# Patient Record
Sex: Male | Born: 1945
Health system: Southern US, Community
[De-identification: ages and names within clinical notes are randomized; demographics above are authoritative.]

## PROBLEM LIST (undated history)

## (undated) DIAGNOSIS — I739 Peripheral vascular disease, unspecified: Secondary | ICD-10-CM

## (undated) DIAGNOSIS — F32A Depression, unspecified: Secondary | ICD-10-CM

## (undated) DIAGNOSIS — E7800 Pure hypercholesterolemia, unspecified: Secondary | ICD-10-CM

## (undated) DIAGNOSIS — B1920 Unspecified viral hepatitis C without hepatic coma: Secondary | ICD-10-CM

## (undated) DIAGNOSIS — F1910 Other psychoactive substance abuse, uncomplicated: Secondary | ICD-10-CM

## (undated) DIAGNOSIS — I10 Essential (primary) hypertension: Secondary | ICD-10-CM

## (undated) DIAGNOSIS — R350 Frequency of micturition: Secondary | ICD-10-CM

## (undated) DIAGNOSIS — F419 Anxiety disorder, unspecified: Secondary | ICD-10-CM

## (undated) DIAGNOSIS — J449 Chronic obstructive pulmonary disease, unspecified: Secondary | ICD-10-CM

## (undated) DIAGNOSIS — L989 Disorder of the skin and subcutaneous tissue, unspecified: Secondary | ICD-10-CM

## (undated) DIAGNOSIS — E119 Type 2 diabetes mellitus without complications: Secondary | ICD-10-CM

## (undated) DIAGNOSIS — K219 Gastro-esophageal reflux disease without esophagitis: Secondary | ICD-10-CM

## (undated) DIAGNOSIS — Z531 Procedure and treatment not carried out because of patient's decision for reasons of belief and group pressure: Secondary | ICD-10-CM

## (undated) DIAGNOSIS — F329 Major depressive disorder, single episode, unspecified: Secondary | ICD-10-CM

## (undated) DIAGNOSIS — F411 Generalized anxiety disorder: Secondary | ICD-10-CM

## (undated) HISTORY — DX: Anxiety disorder, unspecified: F41.9

## (undated) HISTORY — DX: Chronic obstructive pulmonary disease, unspecified: J44.9

## (undated) HISTORY — DX: Other psychoactive substance abuse, uncomplicated: F19.10

---
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
## 2013-11-01 ENCOUNTER — Encounter (HOSPITAL_COMMUNITY): Payer: Self-pay | Admitting: Emergency Medicine

## 2013-11-01 ENCOUNTER — Emergency Department (INDEPENDENT_AMBULATORY_CARE_PROVIDER_SITE_OTHER)
Admission: EM | Admit: 2013-11-01 | Discharge: 2013-11-01 | Disposition: A | Discharge status: Home / Self Care | Payer: Medicare HMO | Source: Home / Self Care | Attending: Emergency Medicine | Admitting: Emergency Medicine

## 2013-11-01 DIAGNOSIS — Z760 Encounter for issue of repeat prescription: Secondary | ICD-10-CM

## 2013-11-01 DIAGNOSIS — I10 Essential (primary) hypertension: Secondary | ICD-10-CM

## 2013-11-01 HISTORY — DX: Pure hypercholesterolemia, unspecified: E78.00

## 2013-11-01 HISTORY — DX: Essential (primary) hypertension: I10

## 2013-11-01 MED ORDER — ATORVASTATIN CALCIUM 10 MG PO TABS: 10.0000 mg | ORAL_TABLET | Freq: Every day | ORAL | Status: DC

## 2013-11-01 MED ORDER — VALSARTAN 160 MG PO TABS: 160.0000 mg | ORAL_TABLET | Freq: Every day | ORAL | Status: DC

## 2013-11-01 MED ORDER — TRAZODONE HCL 50 MG PO TABS: 50.0000 mg | ORAL_TABLET | Freq: Every day | ORAL | Status: DC

## 2013-11-01 NOTE — ED Notes (Signed)
Pt   New  To  Area      Has  No PCP   Needs    Some  Of  His   meds  refill

## 2013-11-01 NOTE — Discharge Instructions (Signed)
Medication Refill, Emergency Department  We have refilled your medication today as a courtesy to you. It is best for your medical care, however, to take care of getting refills done through your primary caregiver's office. They have your records and can do a better job of follow-up than we can in the emergency department.  On maintenance medications, we often only prescribe enough medications to get you by until you are able to see your regular caregiver. This is a more expensive way to refill medications.  In the future, please plan for refills so that you will not have to use the emergency department for this.  Thank you for your help. Your help allows us to better take care of the daily emergencies that enter our department.  Document Released: 10/04/2003 Document Revised: 09/09/2011 Document Reviewed: 06/17/2005  ExitCare® Patient Information ©2014 [employer].

## 2013-11-01 NOTE — ED Provider Notes (Signed)
Medical screening examination/treatment/procedure(s) were performed by non-physician practitioner and as supervising physician I was immediately available for consultation/collaboration.  Leslee Homeavid Isaih Bulger, M.D.  Reuben Likesavid C Lenville Hibberd, MD 11/01/13 [phone]

## 2013-11-01 NOTE — ED Provider Notes (Signed)
CSN: [id number]     Arrival date & time 11/01/13  [id number] History   None    Chief Complaint  Patient presents with  . Medication Refill   (Consider location/radiation/quality/duration/timing/severity/associated sxs/prior Treatment) HPI Comments: Pt reports he recently moved here.  He needs rx for medications.   Pt's Rn has scheduled apointment  With primary MD on Friday  No medical complaints  The history is provided by the patient. No language interpreter was used.    Past Medical History  Diagnosis Date  . Diabetes mellitus without complication   . Hypertension   . Hypercholesterolemia    History reviewed. No pertinent past surgical history. History reviewed. No pertinent family history. History  Substance Use Topics  . Smoking status: Current Every Day Smoker  . Smokeless tobacco: Not on file  . Alcohol Use: No     Comment: former  drinker    Review of Systems  All other systems reviewed and are negative.   Allergies  Review of patient's allergies indicates no known allergies.  Home Medications   Prior to Admission medications   Medication Sig Start Date End Date Taking? Authorizing Provider  amLODipine (NORVASC) 5 MG tablet Take 5 mg by mouth daily.   Yes Historical Provider, MD  atorvastatin (LIPITOR) 10 MG tablet Take 10 mg by mouth daily.   Yes Historical Provider, MD  Canagliflozin (INVOKANA) 100 MG TABS Take by mouth.   Yes Historical Provider, MD  metFORMIN (GLUCOPHAGE) 500 MG tablet Take by mouth 2 (two) times daily with a meal.   Yes Historical Provider, MD  traZODone (DESYREL) 50 MG tablet Take 50 mg by mouth at bedtime.   Yes Historical Provider, MD  valsartan (DIOVAN) 160 MG tablet Take 160 mg by mouth daily.   Yes Historical Provider, MD   BP 136/86  Pulse 59  Temp(Src) 98.6 F (37 C) (Oral)  Resp 18  SpO2 90% Physical Exam  Nursing note and vitals reviewed. Constitutional: He is oriented to person, place, and time. He appears well-developed and  well-nourished.  HENT:  Head: Normocephalic.  Eyes: EOM are normal.  Neck: Normal range of motion.  Pulmonary/Chest: Effort normal.  Abdominal: He exhibits no distension.  Musculoskeletal: Normal range of motion.  Neurological: He is alert and oriented to person, place, and time.  Psychiatric: He has a normal mood and affect.    ED Course  Procedures (including critical care time) Labs Review Labs Reviewed - No data to display  Imaging Review No results found.   MDM   1. Medication refill    Valsartan Trazodone atorvastatin    Elson AreasLeslie K Collette Pescador, PA-C 11/01/13 [address]19  Jamair Cato K [place] Hills[place]ofia, [place] 11/01/13 1128  Lonia SkinnerLeslie K [place], PA-C 11/01/13 1128  Lonia SkinnerLeslie K [place], [place] 11/01/13 1143

## 2014-09-05 DIAGNOSIS — F17210 Nicotine dependence, cigarettes, uncomplicated: Secondary | ICD-10-CM | POA: Diagnosis not present

## 2014-09-05 DIAGNOSIS — N529 Male erectile dysfunction, unspecified: Secondary | ICD-10-CM | POA: Diagnosis not present

## 2014-09-05 DIAGNOSIS — E669 Obesity, unspecified: Secondary | ICD-10-CM | POA: Diagnosis not present

## 2014-09-05 DIAGNOSIS — E780 Pure hypercholesterolemia: Secondary | ICD-10-CM | POA: Diagnosis not present

## 2014-09-05 DIAGNOSIS — I10 Essential (primary) hypertension: Secondary | ICD-10-CM | POA: Diagnosis not present

## 2014-09-05 DIAGNOSIS — E119 Type 2 diabetes mellitus without complications: Secondary | ICD-10-CM | POA: Diagnosis not present

## 2014-09-05 DIAGNOSIS — R6889 Other general symptoms and signs: Secondary | ICD-10-CM | POA: Diagnosis not present

## 2014-09-27 DIAGNOSIS — R6889 Other general symptoms and signs: Secondary | ICD-10-CM | POA: Diagnosis not present

## 2014-12-07 DIAGNOSIS — F1920 Other psychoactive substance dependence, uncomplicated: Secondary | ICD-10-CM | POA: Diagnosis not present

## 2014-12-07 DIAGNOSIS — I10 Essential (primary) hypertension: Secondary | ICD-10-CM | POA: Diagnosis not present

## 2014-12-07 DIAGNOSIS — E780 Pure hypercholesterolemia: Secondary | ICD-10-CM | POA: Diagnosis not present

## 2014-12-07 DIAGNOSIS — F1010 Alcohol abuse, uncomplicated: Secondary | ICD-10-CM | POA: Diagnosis not present

## 2014-12-07 DIAGNOSIS — E119 Type 2 diabetes mellitus without complications: Secondary | ICD-10-CM | POA: Diagnosis not present

## 2014-12-07 DIAGNOSIS — R6889 Other general symptoms and signs: Secondary | ICD-10-CM | POA: Diagnosis not present

## 2014-12-07 DIAGNOSIS — F17210 Nicotine dependence, cigarettes, uncomplicated: Secondary | ICD-10-CM | POA: Diagnosis not present

## 2015-06-22 DIAGNOSIS — Z6831 Body mass index (BMI) 31.0-31.9, adult: Secondary | ICD-10-CM | POA: Diagnosis not present

## 2015-06-22 DIAGNOSIS — Z0000 Encounter for general adult medical examination without abnormal findings: Secondary | ICD-10-CM | POA: Diagnosis not present

## 2015-06-22 DIAGNOSIS — E784 Other hyperlipidemia: Secondary | ICD-10-CM | POA: Diagnosis not present

## 2015-06-22 DIAGNOSIS — E669 Obesity, unspecified: Secondary | ICD-10-CM | POA: Diagnosis not present

## 2015-06-22 DIAGNOSIS — E1169 Type 2 diabetes mellitus with other specified complication: Secondary | ICD-10-CM | POA: Diagnosis not present

## 2015-06-22 DIAGNOSIS — I10 Essential (primary) hypertension: Secondary | ICD-10-CM | POA: Diagnosis not present

## 2015-08-22 DIAGNOSIS — E119 Type 2 diabetes mellitus without complications: Secondary | ICD-10-CM | POA: Diagnosis not present

## 2015-08-22 DIAGNOSIS — Z209 Contact with and (suspected) exposure to unspecified communicable disease: Secondary | ICD-10-CM | POA: Diagnosis not present

## 2015-08-22 DIAGNOSIS — F17210 Nicotine dependence, cigarettes, uncomplicated: Secondary | ICD-10-CM | POA: Diagnosis not present

## 2015-08-22 DIAGNOSIS — R6889 Other general symptoms and signs: Secondary | ICD-10-CM | POA: Diagnosis not present

## 2015-08-22 DIAGNOSIS — Z7984 Long term (current) use of oral hypoglycemic drugs: Secondary | ICD-10-CM | POA: Diagnosis not present

## 2015-08-22 DIAGNOSIS — Z87898 Personal history of other specified conditions: Secondary | ICD-10-CM | POA: Diagnosis not present

## 2015-08-22 DIAGNOSIS — Z23 Encounter for immunization: Secondary | ICD-10-CM | POA: Diagnosis not present

## 2015-08-22 DIAGNOSIS — E7800 Pure hypercholesterolemia, unspecified: Secondary | ICD-10-CM | POA: Diagnosis not present

## 2015-08-22 DIAGNOSIS — I10 Essential (primary) hypertension: Secondary | ICD-10-CM | POA: Diagnosis not present

## 2015-09-01 DIAGNOSIS — R894 Abnormal immunological findings in specimens from other organs, systems and tissues: Secondary | ICD-10-CM | POA: Diagnosis not present

## 2015-10-04 ENCOUNTER — Other Ambulatory Visit (HOSPITAL_COMMUNITY): Payer: Self-pay | Admitting: Gastroenterology

## 2015-10-04 ENCOUNTER — Other Ambulatory Visit: Payer: Self-pay | Admitting: Gastroenterology

## 2015-10-04 DIAGNOSIS — B1920 Unspecified viral hepatitis C without hepatic coma: Secondary | ICD-10-CM

## 2015-11-01 ENCOUNTER — Ambulatory Visit (HOSPITAL_COMMUNITY)
Admission: EM | Admit: 2015-11-01 | Discharge: 2015-11-01 | Disposition: A | Discharge status: Home / Self Care | Payer: 59 | Attending: Family Medicine | Admitting: Family Medicine

## 2015-11-01 ENCOUNTER — Ambulatory Visit (INDEPENDENT_AMBULATORY_CARE_PROVIDER_SITE_OTHER): Payer: 59

## 2015-11-01 ENCOUNTER — Encounter (HOSPITAL_COMMUNITY): Payer: Self-pay | Admitting: *Deleted

## 2015-11-01 DIAGNOSIS — J440 Chronic obstructive pulmonary disease with acute lower respiratory infection: Secondary | ICD-10-CM

## 2015-11-01 DIAGNOSIS — J209 Acute bronchitis, unspecified: Secondary | ICD-10-CM

## 2015-11-01 DIAGNOSIS — R05 Cough: Secondary | ICD-10-CM | POA: Diagnosis not present

## 2015-11-01 MED ORDER — GUAIFENESIN-CODEINE 100-10 MG/5ML PO SYRP: 10.0000 mL | ORAL_SOLUTION | Freq: Four times a day (QID) | ORAL | Status: DC | PRN

## 2015-11-01 MED ORDER — PREDNISONE 50 MG PO TABS: ORAL_TABLET | ORAL | Status: DC

## 2015-11-01 MED ORDER — LEVOFLOXACIN 500 MG PO TABS: 500.0000 mg | ORAL_TABLET | Freq: Every day | ORAL | Status: DC

## 2015-11-01 NOTE — ED Provider Notes (Signed)
CSN: [id number]     Arrival date & time 11/01/15  1257 History   First MD Initiated Contact with Patient 11/01/15 1308     Chief Complaint  Patient presents with  . Cough   (Consider location/radiation/quality/duration/timing/severity/associated sxs/prior Treatment) Patient is a 70 y.o. male presenting with cough. The history is provided by the patient.  Cough Cough characteristics:  Productive Sputum characteristics:  Yellow Severity:  Moderate Onset quality:  Gradual Duration:  5 days Progression:  Unchanged Chronicity:  New Smoker: yes   Context: weather changes   Relieved by:  None tried Worsened by:  Nothing tried Associated symptoms: no shortness of breath and no wheezing     Past Medical History  Diagnosis Date  . Diabetes mellitus without complication (HCC)   . Hypertension   . Hypercholesterolemia    History reviewed. No pertinent past surgical history. History reviewed. No pertinent family history. Social History  Substance Use Topics  . Smoking status: Current Every Day Smoker  . Smokeless tobacco: None  . Alcohol Use: No     Comment: former  drinker    Review of Systems  Constitutional: Negative.   HENT: Negative.   Respiratory: Positive for cough. Negative for shortness of breath, wheezing and stridor.   Cardiovascular: Negative.   All other systems reviewed and are negative.   Allergies  Review of patient's allergies indicates no known allergies.  Home Medications   Prior to Admission medications   Medication Sig Start Date End Date Taking? Authorizing Provider  amLODipine (NORVASC) 5 MG tablet Take 5 mg by mouth daily.    Historical Provider, MD  atorvastatin (LIPITOR) 10 MG tablet Take 1 tablet (10 mg total) by mouth daily. 11/01/13   Elson Areas, PA-C  Canagliflozin [facility]) 100 MG TABS Take by mouth.    Historical Provider, MD  metFORMIN (GLUCOPHAGE) 500 MG tablet Take by mouth 2 (two) times daily with a meal.    Historical Provider, MD   traZODone (DESYREL) 50 MG tablet Take 1 tablet (50 mg total) by mouth at bedtime. 11/01/13   Elson Areas, PA-C  valsartan (DIOVAN) 160 MG tablet Take 1 tablet (160 mg total) by mouth daily. 11/01/13   Elson Areas, PA-C   Meds Ordered and Administered this Visit  Medications - No data to display  BP 132/78 mmHg  Pulse 78  Temp(Src) 98.6 F (37 C) (Oral)  Resp 16  SpO2 98% No data found.   Physical Exam  Constitutional: He is oriented to person, place, and time. He appears well-developed and well-nourished.  HENT:  Right Ear: External ear normal.  Left Ear: External ear normal.  Mouth/Throat: Oropharynx is clear and moist.  Neck: Normal range of motion. Neck supple.  Cardiovascular: Normal rate, regular rhythm, normal heart sounds and intact distal pulses.   Pulmonary/Chest: Effort normal. He has decreased breath sounds. He has wheezes. He has rhonchi.  Lymphadenopathy:    He has no cervical adenopathy.  Neurological: He is alert and oriented to person, place, and time.  Skin: Skin is warm and dry.  Nursing note and vitals reviewed.   ED Course  Procedures (including critical care time)  Labs Review Labs Reviewed - No data to display  Imaging Review No results found.  X-rays reviewed and report per radiologist.  Visual Acuity Review  Right Eye Distance:   Left Eye Distance:   Bilateral Distance:    Right Eye Near:   Left Eye Near:    Bilateral Near:  MDM  No diagnosis found.  Meds ordered this encounter  Medications  . levofloxacin (LEVAQUIN) 500 MG tablet    Sig: Take 1 tablet (500 mg total) by mouth daily.    Dispense:  7 tablet    Refill:  0  . guaiFENesin-codeine (ROBITUSSIN AC) 100-10 MG/5ML syrup    Sig: Take 10 mLs by mouth 4 (four) times daily as needed for cough.    Dispense:  180 mL    Refill:  0  . predniSONE (DELTASONE) 50 MG tablet    Sig: 1 tab daily for 2 days then 1/2 tab daily for 2 days. Start on thurs5/09/2015     Dispense:  3 tablet    Refill:  0      Linna HoffJames D Apurva Reily, MD 11/01/15 1409

## 2015-11-01 NOTE — ED Notes (Signed)
Pt  Has   Symptoms       Of    Cough  /  Congested   With    Onset    X  5  Days     Pt   Is  A   Smoker       The  Pt  Reports   Symptoms  Are  Not   releived  By  [facility]  meds

## 2015-11-02 ENCOUNTER — Ambulatory Visit (HOSPITAL_COMMUNITY): Payer: 59

## 2015-11-03 DIAGNOSIS — B1920 Unspecified viral hepatitis C without hepatic coma: Secondary | ICD-10-CM | POA: Diagnosis not present

## 2015-12-07 ENCOUNTER — Ambulatory Visit (HOSPITAL_COMMUNITY)
Admission: RE | Admit: 2015-12-07 | Discharge: 2015-12-07 | Disposition: A | Discharge status: Home / Self Care | Payer: Commercial Managed Care - HMO | Source: Ambulatory Visit | Attending: Gastroenterology | Admitting: Gastroenterology

## 2015-12-07 DIAGNOSIS — B1920 Unspecified viral hepatitis C without hepatic coma: Secondary | ICD-10-CM | POA: Diagnosis not present

## 2015-12-07 DIAGNOSIS — N281 Cyst of kidney, acquired: Secondary | ICD-10-CM | POA: Insufficient documentation

## 2015-12-07 DIAGNOSIS — I77811 Abdominal aortic ectasia: Secondary | ICD-10-CM | POA: Diagnosis not present

## 2016-01-11 DIAGNOSIS — B1920 Unspecified viral hepatitis C without hepatic coma: Secondary | ICD-10-CM | POA: Diagnosis not present

## 2016-01-11 DIAGNOSIS — Z1211 Encounter for screening for malignant neoplasm of colon: Secondary | ICD-10-CM | POA: Diagnosis not present

## 2016-01-11 DIAGNOSIS — Z720 Tobacco use: Secondary | ICD-10-CM | POA: Diagnosis not present

## 2016-01-11 DIAGNOSIS — N281 Cyst of kidney, acquired: Secondary | ICD-10-CM | POA: Diagnosis not present

## 2016-02-21 DIAGNOSIS — F17210 Nicotine dependence, cigarettes, uncomplicated: Secondary | ICD-10-CM | POA: Diagnosis not present

## 2016-02-21 DIAGNOSIS — Z87898 Personal history of other specified conditions: Secondary | ICD-10-CM | POA: Diagnosis not present

## 2016-02-21 DIAGNOSIS — I10 Essential (primary) hypertension: Secondary | ICD-10-CM | POA: Diagnosis not present

## 2016-02-21 DIAGNOSIS — E119 Type 2 diabetes mellitus without complications: Secondary | ICD-10-CM | POA: Diagnosis not present

## 2016-02-21 DIAGNOSIS — R894 Abnormal immunological findings in specimens from other organs, systems and tissues: Secondary | ICD-10-CM | POA: Diagnosis not present

## 2016-02-21 DIAGNOSIS — R6889 Other general symptoms and signs: Secondary | ICD-10-CM | POA: Diagnosis not present

## 2016-02-21 DIAGNOSIS — F411 Generalized anxiety disorder: Secondary | ICD-10-CM | POA: Diagnosis not present

## 2016-02-21 DIAGNOSIS — Z23 Encounter for immunization: Secondary | ICD-10-CM | POA: Diagnosis not present

## 2016-02-21 DIAGNOSIS — Z1211 Encounter for screening for malignant neoplasm of colon: Secondary | ICD-10-CM | POA: Diagnosis not present

## 2016-02-21 DIAGNOSIS — E669 Obesity, unspecified: Secondary | ICD-10-CM | POA: Diagnosis not present

## 2016-02-21 DIAGNOSIS — B1920 Unspecified viral hepatitis C without hepatic coma: Secondary | ICD-10-CM | POA: Diagnosis not present

## 2016-02-21 DIAGNOSIS — E7800 Pure hypercholesterolemia, unspecified: Secondary | ICD-10-CM | POA: Diagnosis not present

## 2016-03-06 DIAGNOSIS — Z1211 Encounter for screening for malignant neoplasm of colon: Secondary | ICD-10-CM | POA: Diagnosis not present

## 2016-03-22 DIAGNOSIS — B1920 Unspecified viral hepatitis C without hepatic coma: Secondary | ICD-10-CM | POA: Diagnosis not present

## 2016-04-25 DIAGNOSIS — Z23 Encounter for immunization: Secondary | ICD-10-CM | POA: Diagnosis not present

## 2016-04-25 DIAGNOSIS — F419 Anxiety disorder, unspecified: Secondary | ICD-10-CM | POA: Diagnosis not present

## 2016-08-07 DIAGNOSIS — B1920 Unspecified viral hepatitis C without hepatic coma: Secondary | ICD-10-CM | POA: Diagnosis not present

## 2016-10-08 DIAGNOSIS — B1920 Unspecified viral hepatitis C without hepatic coma: Secondary | ICD-10-CM | POA: Insufficient documentation

## 2016-10-08 DIAGNOSIS — Z720 Tobacco use: Secondary | ICD-10-CM | POA: Insufficient documentation

## 2016-10-08 DIAGNOSIS — F1910 Other psychoactive substance abuse, uncomplicated: Secondary | ICD-10-CM | POA: Insufficient documentation

## 2016-10-08 DIAGNOSIS — F411 Generalized anxiety disorder: Secondary | ICD-10-CM | POA: Insufficient documentation

## 2016-10-08 DIAGNOSIS — E785 Hyperlipidemia, unspecified: Secondary | ICD-10-CM | POA: Insufficient documentation

## 2016-10-08 DIAGNOSIS — E119 Type 2 diabetes mellitus without complications: Secondary | ICD-10-CM | POA: Insufficient documentation

## 2016-10-08 DIAGNOSIS — F1010 Alcohol abuse, uncomplicated: Secondary | ICD-10-CM | POA: Insufficient documentation

## 2016-10-08 DIAGNOSIS — N529 Male erectile dysfunction, unspecified: Secondary | ICD-10-CM | POA: Insufficient documentation

## 2016-10-08 DIAGNOSIS — I10 Essential (primary) hypertension: Secondary | ICD-10-CM | POA: Insufficient documentation

## 2016-10-09 ENCOUNTER — Ambulatory Visit (INDEPENDENT_AMBULATORY_CARE_PROVIDER_SITE_OTHER): Payer: Medicare HMO | Admitting: Family Medicine

## 2016-10-09 ENCOUNTER — Encounter: Payer: Self-pay | Admitting: Family Medicine

## 2016-10-09 VITALS — BP 128/76 | HR 64 | Temp 98.5°F | Resp 20 | Ht 70.0 in | Wt 217.1 lb

## 2016-10-09 DIAGNOSIS — F411 Generalized anxiety disorder: Secondary | ICD-10-CM | POA: Diagnosis not present

## 2016-10-09 DIAGNOSIS — Z720 Tobacco use: Secondary | ICD-10-CM | POA: Diagnosis not present

## 2016-10-09 DIAGNOSIS — B182 Chronic viral hepatitis C: Secondary | ICD-10-CM

## 2016-10-09 DIAGNOSIS — I10 Essential (primary) hypertension: Secondary | ICD-10-CM | POA: Diagnosis not present

## 2016-10-09 DIAGNOSIS — F1910 Other psychoactive substance abuse, uncomplicated: Secondary | ICD-10-CM

## 2016-10-09 DIAGNOSIS — E119 Type 2 diabetes mellitus without complications: Secondary | ICD-10-CM | POA: Diagnosis not present

## 2016-10-09 DIAGNOSIS — E785 Hyperlipidemia, unspecified: Secondary | ICD-10-CM

## 2016-10-09 DIAGNOSIS — F1010 Alcohol abuse, uncomplicated: Secondary | ICD-10-CM

## 2016-10-09 DIAGNOSIS — H547 Unspecified visual loss: Secondary | ICD-10-CM | POA: Diagnosis not present

## 2016-10-09 DIAGNOSIS — N529 Male erectile dysfunction, unspecified: Secondary | ICD-10-CM

## 2016-10-09 LAB — CBC
HCT: 50 % (ref 38.5–50.0)
HEMOGLOBIN: 17.4 g/dL — AB (ref 13.2–17.1)
MCH: 30.5 pg (ref 27.0–33.0)
MCHC: 34.8 g/dL (ref 32.0–36.0)
MCV: 87.6 fL (ref 80.0–100.0)
MPV: 10.4 fL (ref 7.5–12.5)
PLATELETS: 219 10*3/uL (ref 140–400)
RBC: 5.71 MIL/uL (ref 4.20–5.80)
RDW: 14 % (ref 11.0–15.0)
WBC: 11.1 10*3/uL — ABNORMAL HIGH (ref 3.8–10.8)

## 2016-10-09 LAB — COMPREHENSIVE METABOLIC PANEL
ALBUMIN: 3.7 g/dL (ref 3.6–5.1)
ALT: 17 U/L (ref 9–46)
AST: 15 U/L (ref 10–35)
Alkaline Phosphatase: 125 U/L — ABNORMAL HIGH (ref 40–115)
BILIRUBIN TOTAL: 0.5 mg/dL (ref 0.2–1.2)
BUN: 9 mg/dL (ref 7–25)
CO2: 25 mmol/L (ref 20–31)
CREATININE: 0.85 mg/dL (ref 0.70–1.25)
Calcium: 9.1 mg/dL (ref 8.6–10.3)
Chloride: 106 mmol/L (ref 98–110)
GLUCOSE: 95 mg/dL (ref 65–99)
Potassium: 3.8 mmol/L (ref 3.5–5.3)
Sodium: 139 mmol/L (ref 135–146)
Total Protein: 7.2 g/dL (ref 6.1–8.1)

## 2016-10-09 LAB — LIPID PANEL
CHOL/HDL RATIO: 3.8 ratio (ref <5.0)
Cholesterol: 117 mg/dL (ref <200)
HDL: 31 mg/dL — ABNORMAL LOW (ref >40)
LDL Cholesterol: 61 mg/dL (ref <100)
Triglycerides: 126 mg/dL (ref <150)
VLDL: 25 mg/dL (ref <30)

## 2016-10-09 NOTE — Patient Instructions (Signed)
Drink plenty of water Stop drinking soda Walk every day that you are able Stop smoking No change in medicines Need updated labs today I will send you a letter with your test results.  If there is anything of concern, we will call right away.   We will talk about lung cancer screening and colon cancer screening next time Come back in a month

## 2016-10-09 NOTE — Progress Notes (Signed)
Chief Complaint  Patient presents with  . Establish Care   New to establish Old records DR Hyacinth Meeker at [facility] reviewed Well controlled DM with A1c under 7 Admits he follows NO diet and drinks coke every day BP well controlled  Last LDL 29 Is still abstaining from drugs and alcohol for 2 y I have discussed the multiple health risks associated with cigarette smoking including, but not limited to, cardiovascular disease, lung disease and cancer.  I have strongly recommended that smoking be stopped.  I have reviewed the various methods of quitting including cold [place], classes, nicotine replacements and prescription medications.  I have offered assistance in this difficult process.  He says he has patches from his insurance company and will"put one on today" He complains of anxiety.  Requests valium.  I note in the old records he used to ask Dr Hyacinth Meeker for this.  He was given lexapro.  Didn't like it.  Does not want anything else.   Never had colonoscopy Never had chest CT We will discuss this next visit His hepatitis C was successfully treated.  Patient Active Problem List   Diagnosis Date Noted  . Alcohol abuse 10/08/2016  . Substance abuse 10/08/2016  . Tobacco abuse 10/08/2016  . GAD (generalized anxiety disorder) 10/08/2016  . Controlled type 2 diabetes mellitus without complication, without long-term current use of insulin (HCC) 10/08/2016  . HLD (hyperlipidemia) 10/08/2016  . Hepatitis C 10/08/2016  . Essential hypertension 10/08/2016  . ED (erectile dysfunction) 10/08/2016    Outpatient Encounter Prescriptions as of 10/09/2016  Medication Sig  . amLODipine (NORVASC) 5 MG tablet Take 5 mg by mouth daily.  [name] aspirin 325 MG EC tablet Take 325 mg by mouth daily.  [name] atorvastatin (LIPITOR) 10 MG tablet Take 1 tablet (10 mg total) by mouth daily.  . Canagliflozin (INVOKANA) 100 MG TABS Take by mouth.  . valsartan (DIOVAN) 160 MG tablet Take 1 tablet (160 mg total) by  mouth daily.   No facility-administered encounter medications on file as of 10/09/2016.     Past Medical History:  Diagnosis Date  . COPD (chronic obstructive pulmonary disease) (HCC)   . Diabetes mellitus without complication (HCC)   . Hep C w/o coma, chronic (HCC)   . Hypercholesterolemia   . Hypertension   . Substance abuse    History reviewed. No pertinent surgical history.  Social History   Social History  . Marital status: Single    Spouse name: N/A  . Number of children: 0  . Years of education: 26   Occupational History  . retired     labor   Social History Main Topics  . Smoking status: Current Every Day Smoker    Packs/day: 1.00    Types: Cigarettes    Start date: 07/01/1961  . Smokeless tobacco: Never Used     Comment: using a patch  . Alcohol use No     Comment: former  drinker quit 2 years  . Drug use: No     Comment: recovered  . Sexual activity: Not Currently   Other Topics Concern  . Not on file   Social History Narrative   Live in boarding house   Renting a room   looking for apartment   Never had license   Likes to read    Family History  Problem Relation Age of Onset  . Alzheimer's disease Mother   . Mental illness Mother   . Cancer Father     bladder?  [name]  Hypertension Father   . Alcohol abuse Father   . Heart disease Sister   . Hearing loss Sister     valve  . Alcohol abuse Brother   . Diabetes Paternal Aunt   . Alcohol abuse Brother   . Alcohol abuse Brother   . Alcohol abuse Brother   . Early death Brother     MVA  . Cancer Maternal Grandmother     Review of Systems  Constitutional: Negative for chills, fever and weight loss.  HENT: Negative for congestion and hearing loss.   Eyes: Negative for blurred vision and pain.       Overdue eye exam  Respiratory: Positive for wheezing. Negative for cough and shortness of breath.   Cardiovascular: Negative for chest pain and leg swelling.  Gastrointestinal: Negative for  abdominal pain, constipation, diarrhea and heartburn.  Genitourinary: Negative for dysuria and frequency.  Musculoskeletal: Negative for falls, joint pain and myalgias.  Neurological: Negative for dizziness, seizures and headaches.  Psychiatric/Behavioral: Negative for depression and substance abuse. The patient is nervous/anxious and has insomnia.    BP 128/76 (BP Location: Right Arm, Patient Position: Sitting, Cuff Size: Normal)   Pulse 64   Temp 98.5 F (36.9 C) (Temporal)   Resp 20   Ht  (1.778 m)   Wt 217 lb 1.3 oz (98.5 kg)   SpO2 97%   BMI 31.15 kg/m   Physical Exam  Constitutional: He is oriented to person, place, and time. He appears well-developed and well-nourished.  Smells of tobacco  HENT:  Head: Normocephalic and atraumatic.  Mouth/Throat: Oropharynx is clear and moist.  Eyes: Conjunctivae are normal. Pupils are equal, round, and reactive to light.  Neck: Normal range of motion. Neck supple. No thyromegaly present.  Cardiovascular: Normal rate, regular rhythm and normal heart sounds.   Pulmonary/Chest: Effort normal. No respiratory distress. He has wheezes.  Scattered end insp wheeze  Abdominal: Soft. Bowel sounds are normal.  Musculoskeletal: Normal range of motion. He exhibits no edema.  Lymphadenopathy:    He has no cervical adenopathy.  Neurological: He is alert and oriented to person, place, and time.  Gait normal  Skin: Skin is warm and dry.  Psychiatric: He has a normal mood and affect. His behavior is normal. Thought content normal.  Nursing note and vitals reviewed.  ASSESSMENT/PLAN:  1. Alcohol abuse In remission  2. Substance abuse In remission  3. Tobacco abuse Trying to quit  4. GAD (generalized anxiety disorder) Refuses med other than valium  5. Controlled type 2 diabetes mellitus without complication, without long-term current use of insulin (HCC)  - CBC - Comprehensive metabolic panel - Hemoglobin A1c - Lipid panel -  Urinalysis, Routine w reflex microscopic - Ambulatory referral to Ophthalmology  6. Hyperlipidemia, unspecified hyperlipidemia type  7. Chronic hepatitis C without hepatic coma (HCC) treated  8. Essential hypertension  9. Erectile dysfunction, unspecified erectile dysfunction type By history 10. Vision impairment - Ambulatory referral to Ophthalmology   Patient Instructions  Drink plenty of water Stop drinking soda Walk every day that you are able Stop smoking No change in medicines Need updated labs today I will send you a letter with your test results.  If there is anything of concern, we will call right away.   We will talk about lung cancer screening and colon cancer screening next time Come back in a month   Eustace Moore, MD

## 2016-10-10 ENCOUNTER — Encounter: Payer: Self-pay | Admitting: Family Medicine

## 2016-10-10 LAB — URINALYSIS, MICROSCOPIC ONLY
Bacteria, UA: NONE SEEN [HPF]
Casts: NONE SEEN [LPF]
Crystals: NONE SEEN [HPF]
RBC / HPF: NONE SEEN RBC/HPF (ref <=2)
Squamous Epithelial / LPF: NONE SEEN [HPF] (ref <=5)
WBC UA: NONE SEEN WBC/HPF (ref <=5)
Yeast: NONE SEEN [HPF]

## 2016-10-10 LAB — URINALYSIS, ROUTINE W REFLEX MICROSCOPIC
BILIRUBIN URINE: NEGATIVE
Hgb urine dipstick: NEGATIVE
KETONES UR: NEGATIVE
Leukocytes, UA: NEGATIVE
Nitrite: NEGATIVE
Specific Gravity, Urine: 1.01 (ref 1.001–1.035)
pH: 6 (ref 5.0–8.0)

## 2016-10-10 LAB — HEMOGLOBIN A1C
Hgb A1c MFr Bld: 6.2 % — ABNORMAL HIGH (ref <5.7)
MEAN PLASMA GLUCOSE: 131 mg/dL

## 2016-11-08 ENCOUNTER — Ambulatory Visit: Payer: Medicare HMO | Admitting: Family Medicine

## 2016-11-11 ENCOUNTER — Encounter: Payer: Self-pay | Admitting: Family Medicine

## 2016-11-11 ENCOUNTER — Ambulatory Visit (INDEPENDENT_AMBULATORY_CARE_PROVIDER_SITE_OTHER): Payer: Medicare HMO | Admitting: Family Medicine

## 2016-11-11 ENCOUNTER — Telehealth: Payer: Self-pay

## 2016-11-11 VITALS — BP 110/72 | HR 72 | Temp 97.7°F | Resp 18 | Ht 70.0 in | Wt 213.0 lb

## 2016-11-11 DIAGNOSIS — E119 Type 2 diabetes mellitus without complications: Secondary | ICD-10-CM

## 2016-11-11 DIAGNOSIS — I10 Essential (primary) hypertension: Secondary | ICD-10-CM | POA: Diagnosis not present

## 2016-11-11 DIAGNOSIS — E785 Hyperlipidemia, unspecified: Secondary | ICD-10-CM | POA: Diagnosis not present

## 2016-11-11 DIAGNOSIS — Z87891 Personal history of nicotine dependence: Secondary | ICD-10-CM | POA: Diagnosis not present

## 2016-11-11 DIAGNOSIS — Z8619 Personal history of other infectious and parasitic diseases: Secondary | ICD-10-CM

## 2016-11-11 MED ORDER — METFORMIN HCL 500 MG PO TABS: 250.0000 mg | ORAL_TABLET | Freq: Every day | ORAL | 1 refills | Status: DC

## 2016-11-11 NOTE — Patient Instructions (Addendum)
Eye exam next month / make sure you get me a copy of the report  [facility]  Your blood tests were all good  You still need colonoscopy  Stay on the same medicines  See me in 3 months

## 2016-11-11 NOTE — Progress Notes (Signed)
Chief Complaint  Patient presents with  . Follow-up    1 month   Routine follow up DM well controlled BP is good LDL is 61 Compliant with medicines Again brings up that Valium is the "only thing that helps me", and that his stress is making him sick. He agrees to chest CT for lung cancer screening Refuses colon cancer screening - he did a 3 card hemoccult this year and thinks it good. Is scheduled for an eye exam this month  Patient Active Problem List   Diagnosis Date Noted  . History of hepatitis C 11/11/2016  . History of smoking 30 or more pack years 11/11/2016  . Alcohol abuse 10/08/2016  . Substance abuse 10/08/2016  . Tobacco abuse 10/08/2016  . GAD (generalized anxiety disorder) 10/08/2016  . Controlled type 2 diabetes mellitus without complication, without long-term current use of insulin (HCC) 10/08/2016  . HLD (hyperlipidemia) 10/08/2016  . Essential hypertension 10/08/2016  . ED (erectile dysfunction) 10/08/2016    Outpatient Encounter Prescriptions as of 11/11/2016  Medication Sig  . aspirin 325 MG EC tablet Take 325 mg by mouth daily.  [name]. atorvastatin (LIPITOR) 10 MG tablet Take 1 tablet (10 mg total) by mouth daily.  . Canagliflozin (INVOKANA) 100 MG TABS Take by mouth.  . metFORMIN (GLUCOPHAGE) 500 MG tablet Take 0.5 tablets (250 mg total) by mouth daily with breakfast.  . valsartan (DIOVAN) 160 MG tablet Take 1 tablet (160 mg total) by mouth daily.  [name]. amLODipine (NORVASC) 10 MG tablet TK 1 T PO QD   No facility-administered encounter medications on file as of 11/11/2016.     No Known Allergies  Review of Systems  Constitutional: Negative for activity change and appetite change.  HENT: Negative for congestion and dental problem.   Eyes: Negative for photophobia and visual disturbance.  Respiratory: Negative for cough and shortness of breath.   Cardiovascular: Negative for chest pain, palpitations and leg swelling.  Gastrointestinal: Negative for  blood in stool, constipation and diarrhea.  Genitourinary: Negative for dysuria and frequency.  Musculoskeletal: Negative for arthralgias and gait problem.  Neurological: Negative for dizziness and headaches.  Psychiatric/Behavioral: Positive for dysphoric mood. Negative for sleep disturbance.   BP 110/72 (BP Location: Right Arm, Patient Position: Sitting, Cuff Size: Normal)   Pulse 72   Temp 97.7 F (36.5 C) (Temporal)   Resp 18   Ht 5\' 10"  (1.778 m)   Wt 213 lb 0.6 oz (96.6 kg)   SpO2 97%   BMI 30.57 kg/m   Physical Exam  Constitutional: He is oriented to person, place, and time. He appears well-developed and well-nourished. No distress.  HENT:  Head: Normocephalic and atraumatic.  Mouth/Throat: Oropharynx is clear and moist.  Eyes: Conjunctivae are normal. Pupils are equal, round, and reactive to light.  Neck: Normal range of motion.  Cardiovascular: Normal rate, regular rhythm and normal heart sounds.   Pulmonary/Chest: Effort normal and breath sounds normal.  Lymphadenopathy:    He has no cervical adenopathy.  Neurological: He is alert and oriented to person, place, and time.  Psychiatric: He has a normal mood and affect. His behavior is normal.    ASSESSMENT/PLAN:  1. Essential hypertension controlled  2. Controlled type 2 diabetes mellitus without complication, without long-term current use of insulin (HCC) A1c is 6.2  3. Hyperlipidemia, unspecified hyperlipidemia type LDL is 61  4. History of smoking 30 or more pack years  - CT CHEST LUNG CA SCREEN LOW DOSE W/O CM; Future  5. History of hepatitis C treated   Patient Instructions  Eye exam next month / make sure you get me a copy of the report  I am scheduling the lung cancer test at [facility]  Your blood tests were all good  You still need colonoscopy  Stay on the same medicines  See me in 3 months   Eustace Moore, MD

## 2016-11-11 NOTE — Telephone Encounter (Signed)
Please change pcp to dr nelson 

## 2016-11-20 ENCOUNTER — Ambulatory Visit (HOSPITAL_COMMUNITY)
Admission: RE | Admit: 2016-11-20 | Discharge: 2016-11-20 | Disposition: A | Discharge status: Home / Self Care | Payer: Medicare HMO | Source: Ambulatory Visit | Attending: Family Medicine | Admitting: Family Medicine

## 2016-11-20 DIAGNOSIS — J439 Emphysema, unspecified: Secondary | ICD-10-CM | POA: Diagnosis not present

## 2016-11-20 DIAGNOSIS — Z87891 Personal history of nicotine dependence: Secondary | ICD-10-CM | POA: Insufficient documentation

## 2016-11-20 DIAGNOSIS — I700 Atherosclerosis of aorta: Secondary | ICD-10-CM | POA: Diagnosis not present

## 2016-11-20 DIAGNOSIS — I2510 Atherosclerotic heart disease of native coronary artery without angina pectoris: Secondary | ICD-10-CM | POA: Diagnosis not present

## 2016-11-20 DIAGNOSIS — F17210 Nicotine dependence, cigarettes, uncomplicated: Secondary | ICD-10-CM | POA: Diagnosis not present

## 2016-11-22 ENCOUNTER — Telehealth: Payer: Self-pay | Admitting: Family Medicine

## 2016-11-22 ENCOUNTER — Encounter: Payer: Self-pay | Admitting: Family Medicine

## 2016-11-22 ENCOUNTER — Other Ambulatory Visit: Payer: Self-pay

## 2016-11-22 MED ORDER — VALSARTAN 160 MG PO TABS: 160.0000 mg | ORAL_TABLET | Freq: Every day | ORAL | 1 refills | Status: DC

## 2016-11-22 MED ORDER — CANAGLIFLOZIN 100 MG PO TABS: 100.0000 mg | ORAL_TABLET | Freq: Every day | ORAL | 1 refills | Status: DC

## 2016-11-22 NOTE — Telephone Encounter (Signed)
Patient calling re: Rx Valsartan.  He states that the Valsartan should be 320mg  instead 160mg .  [facility] [place]  Patient has enough to last for week.

## 2016-11-27 MED ORDER — VALSARTAN 320 MG PO TABS: 320.0000 mg | ORAL_TABLET | Freq: Every day | ORAL | 3 refills | Status: DC

## 2016-11-28 ENCOUNTER — Other Ambulatory Visit: Payer: Self-pay

## 2016-12-03 DIAGNOSIS — B1920 Unspecified viral hepatitis C without hepatic coma: Secondary | ICD-10-CM | POA: Diagnosis not present

## 2016-12-24 DIAGNOSIS — H5200 Hypermetropia, unspecified eye: Secondary | ICD-10-CM | POA: Diagnosis not present

## 2016-12-24 DIAGNOSIS — Z0100 Encounter for examination of eyes and vision without abnormal findings: Secondary | ICD-10-CM | POA: Diagnosis not present

## 2017-01-27 ENCOUNTER — Other Ambulatory Visit: Payer: Self-pay | Admitting: Family Medicine

## 2017-01-27 NOTE — Telephone Encounter (Signed)
Patient would like to speak with Dr. Delton SeeNelson or the nurse about a recall on his medication (Valsartan).  Patient is aware that Dr. Delton SeeNelson with be back in the office tomorrow.

## 2017-01-28 ENCOUNTER — Telehealth: Payer: Self-pay | Admitting: *Deleted

## 2017-01-28 MED ORDER — LOSARTAN POTASSIUM 100 MG PO TABS: 100.0000 mg | ORAL_TABLET | Freq: Every day | ORAL | 3 refills | Status: DC

## 2017-01-28 MED ORDER — AMLODIPINE BESYLATE 10 MG PO TABS: ORAL_TABLET | ORAL | 0 refills | Status: DC

## 2017-01-28 NOTE — Telephone Encounter (Signed)
Patient called stating he needs amlodipine to be refilled.

## 2017-01-28 NOTE — Telephone Encounter (Signed)
Patient informed of message below, verbalized understanding.  

## 2017-01-28 NOTE — Telephone Encounter (Signed)
Order pended. Attempted to call patient, no answer, unable to leave message. Patient will need to return in 1 week after starting new med to get bp check to ensure dose is correct.

## 2017-01-28 NOTE — Telephone Encounter (Signed)
Pend losartan 100 mg daily #90 with 3 refill.  All valsartan patients can be todl we will switch them to a similar medicine with no recalls.

## 2017-01-29 ENCOUNTER — Telehealth: Payer: Self-pay | Admitting: *Deleted

## 2017-01-29 NOTE — Telephone Encounter (Signed)
Patient called to talk with the nurse about a medication that was recalled, patient stated it was a 325 mg I see asprin in the patient's chart for that mg patient stated she was not sure of the name, Please call patient

## 2017-01-29 NOTE — Telephone Encounter (Signed)
Spoke with patient about change in medication- he was concerned about dosage. I informed him that while losartan was similar to valsartan, that they were different medications and that the dosage was not going to be the same. He verbalized understanding and he was also informed to return to clinic in one week for BP check to ensure dosage is correct dosage.

## 2017-02-11 ENCOUNTER — Ambulatory Visit (INDEPENDENT_AMBULATORY_CARE_PROVIDER_SITE_OTHER): Payer: Medicare HMO | Admitting: Family Medicine

## 2017-02-11 ENCOUNTER — Encounter: Payer: Self-pay | Admitting: Family Medicine

## 2017-02-11 VITALS — BP 118/76 | HR 56 | Temp 98.6°F | Resp 18 | Ht 70.0 in | Wt 213.1 lb

## 2017-02-11 DIAGNOSIS — E119 Type 2 diabetes mellitus without complications: Secondary | ICD-10-CM

## 2017-02-11 DIAGNOSIS — I10 Essential (primary) hypertension: Secondary | ICD-10-CM

## 2017-02-11 DIAGNOSIS — E785 Hyperlipidemia, unspecified: Secondary | ICD-10-CM

## 2017-02-11 DIAGNOSIS — Z720 Tobacco use: Secondary | ICD-10-CM | POA: Diagnosis not present

## 2017-02-11 MED ORDER — CANAGLIFLOZIN 100 MG PO TABS: 100.0000 mg | ORAL_TABLET | Freq: Every day | ORAL | 3 refills | Status: DC

## 2017-02-11 NOTE — Patient Instructions (Addendum)
Need labs next time See me in 2- 3 months No change in medicines Come back for a PE next time Blood pressure is good Diabetes well controlled  Congratulations on your reducing the smoking - I hope you can quit

## 2017-02-11 NOTE — Progress Notes (Signed)
Chief Complaint  Patient presents with  . Follow-up    3 month   Patient is here for follow-up He states that he is feeling well. He is trying to quit smoking. He is down to 3 cigarettes a day. He is trying to stay active. He is walking daily. No chest pain or shortness of breath. No cough or wheeze. He is taking his blood pressure medicine. His blood pressure is well controlled. He has well-controlled diabetes with an A1c under 7.  Patient Active Problem List   Diagnosis Date Noted  . History of hepatitis C 11/11/2016  . History of smoking 30 or more pack years 11/11/2016  . Alcohol abuse 10/08/2016  . Substance abuse 10/08/2016  . Tobacco abuse 10/08/2016  . GAD (generalized anxiety disorder) 10/08/2016  . Controlled type 2 diabetes mellitus without complication, without long-term current use of insulin (HCC) 10/08/2016  . HLD (hyperlipidemia) 10/08/2016  . Essential hypertension 10/08/2016  . ED (erectile dysfunction) 10/08/2016    Outpatient Encounter Prescriptions as of 02/11/2017  Medication Sig  . amLODipine (NORVASC) 10 MG tablet TK 1 T PO QD  . aspirin 325 MG EC tablet Take 325 mg by mouth daily.  [name] atorvastatin (LIPITOR) 10 MG tablet Take 1 tablet (10 mg total) by mouth daily.  . canagliflozin (INVOKANA) 100 MG TABS tablet Take 1 tablet (100 mg total) by mouth daily before breakfast.  . losartan (COZAAR) 100 MG tablet Take 1 tablet (100 mg total) by mouth daily.  . metFORMIN (GLUCOPHAGE) 500 MG tablet Take 0.5 tablets (250 mg total) by mouth daily with breakfast.   No facility-administered encounter medications on file as of 02/11/2017.     No Known Allergies  Review of Systems  Constitutional: Negative for activity change and appetite change.  HENT: Negative for congestion and dental problem.   Eyes: Negative for photophobia and visual disturbance.  Respiratory: Negative for cough and shortness of breath.   Cardiovascular: Negative for chest pain,  palpitations and leg swelling.  Gastrointestinal: Negative for blood in stool, constipation and diarrhea.  Genitourinary: Negative for dysuria and frequency.  Musculoskeletal: Negative for arthralgias and gait problem.  Neurological: Negative for dizziness and headaches.  Psychiatric/Behavioral: Negative for sleep disturbance.    BP 118/76 (BP Location: Right Arm, Patient Position: Sitting, Cuff Size: Small)   Pulse (!) 56   Temp 98.6 F (37 C) (Temporal)   Resp 18   Ht 5\' 10"  (1.778 m)   Wt 213 lb 1.9 oz (96.7 kg)   SpO2 97%   BMI 30.58 kg/m   Physical Exam  Constitutional: He is oriented to person, place, and time. He appears well-developed and well-nourished. No distress.  Smells of tobacco  HENT:  Head: Normocephalic and atraumatic.  Mouth/Throat: Oropharynx is clear and moist.  Eyes: Pupils are equal, round, and reactive to light. Conjunctivae are normal.  Neck: Normal range of motion.  Cardiovascular: Normal rate, regular rhythm and normal heart sounds.   Pulmonary/Chest: Effort normal and breath sounds normal.  Musculoskeletal: He exhibits no edema.  Lymphadenopathy:    He has no cervical adenopathy.  Neurological: He is alert and oriented to person, place, and time. He displays normal reflexes. Coordination normal.  Psychiatric: He has a normal mood and affect. His behavior is normal.    ASSESSMENT/PLAN:  1. Essential hypertension   2. Controlled type 2 diabetes mellitus without complication, without long-term current use of insulin (HCC)  - canagliflozin (INVOKANA) 100 MG TABS tablet; Take 1 tablet (  100 mg total) by mouth daily before breakfast.  Dispense: 90 tablet; Refill: 3 - CBC - COMPLETE METABOLIC PANEL WITH GFR - Hemoglobin A1c - Lipid panel - Microalbumin / creatinine urine ratio - Urinalysis, Routine w reflex microscopic  3. Tobacco abuse Trying to quit  4. Hyperlipidemia, unspecified hyperlipidemia type Compliant with statin   Patient  Instructions  Need labs next time See me in 2- 3 months No change in medicines Come back for a PE next time Blood pressure is good Diabetes well controlled  Congratulations on your reducing the smoking - I hope you can quit   Eustace MooreYvonne Sue Melainie Krinsky, MD

## 2017-04-04 ENCOUNTER — Other Ambulatory Visit: Payer: Self-pay

## 2017-04-04 ENCOUNTER — Telehealth: Payer: Self-pay

## 2017-04-04 MED ORDER — ATORVASTATIN CALCIUM 10 MG PO TABS: 10.0000 mg | ORAL_TABLET | Freq: Every day | ORAL | 3 refills | Status: DC

## 2017-04-04 MED ORDER — ATORVASTATIN CALCIUM 10 MG PO TABS
10.0000 mg | ORAL_TABLET | Freq: Every day | ORAL | 3 refills | Status: DC
Start: 2017-04-04 — End: 2017-04-04

## 2017-04-04 NOTE — Telephone Encounter (Signed)
lipitor filled.

## 2017-04-12 ENCOUNTER — Emergency Department (HOSPITAL_COMMUNITY)
Admission: EM | Admit: 2017-04-12 | Discharge: 2017-04-12 | Disposition: A | Discharge status: Home / Self Care | Payer: Medicare HMO | Attending: Emergency Medicine | Admitting: Emergency Medicine

## 2017-04-12 ENCOUNTER — Encounter (HOSPITAL_COMMUNITY): Payer: Self-pay | Admitting: Emergency Medicine

## 2017-04-12 DIAGNOSIS — I10 Essential (primary) hypertension: Secondary | ICD-10-CM | POA: Insufficient documentation

## 2017-04-12 DIAGNOSIS — Z79899 Other long term (current) drug therapy: Secondary | ICD-10-CM | POA: Insufficient documentation

## 2017-04-12 DIAGNOSIS — Z720 Tobacco use: Secondary | ICD-10-CM | POA: Diagnosis not present

## 2017-04-12 DIAGNOSIS — J449 Chronic obstructive pulmonary disease, unspecified: Secondary | ICD-10-CM | POA: Insufficient documentation

## 2017-04-12 DIAGNOSIS — E119 Type 2 diabetes mellitus without complications: Secondary | ICD-10-CM | POA: Insufficient documentation

## 2017-04-12 DIAGNOSIS — Z7984 Long term (current) use of oral hypoglycemic drugs: Secondary | ICD-10-CM | POA: Diagnosis not present

## 2017-04-12 DIAGNOSIS — Z7982 Long term (current) use of aspirin: Secondary | ICD-10-CM | POA: Insufficient documentation

## 2017-04-12 DIAGNOSIS — M79661 Pain in right lower leg: Secondary | ICD-10-CM | POA: Diagnosis present

## 2017-04-12 DIAGNOSIS — I739 Peripheral vascular disease, unspecified: Secondary | ICD-10-CM | POA: Diagnosis not present

## 2017-04-12 DIAGNOSIS — F17210 Nicotine dependence, cigarettes, uncomplicated: Secondary | ICD-10-CM | POA: Diagnosis not present

## 2017-04-12 HISTORY — DX: Generalized anxiety disorder: F41.1

## 2017-04-12 NOTE — ED Triage Notes (Signed)
Patient c/o pain to back of right calf x2-3 months. Denies any swelling. Patient states that pain increases with walking. Per patient feels "like a stiff muscle." Patient reports taking a nap today and waking with lower part of leg feeling numb.

## 2017-04-12 NOTE — ED Provider Notes (Signed)
[facility]-EMERGENCY DEPT Provider Note   CSN: [id number] Arrival date & time: 04/12/17  1550     History   Chief Complaint Chief Complaint  Patient presents with  . Claudication    HPI Alex Marks is a 71 y.o. male.  HPI Patient presents with one-month history of pain in his right calf. States she's been having pain with walking particularly when he walks up hills. Goes away after some rest. No trauma. Swelling. He is diabetic. No injury to it. Feels a little stiff muscle. No fevers. No chest pain or trouble breathing.no numbness or weakness in the foot.he is a smoker but is working on quitting. States he has the patch. Past Medical History:  Diagnosis Date  . COPD (chronic obstructive pulmonary disease) (HCC)   . Diabetes mellitus without complication (HCC)   . GAD (generalized anxiety disorder)   . Hep C w/o coma, chronic (HCC)   . Hypercholesterolemia   . Hypertension   . Substance abuse [facility])     Patient Active Problem List   Diagnosis Date Noted  . History of hepatitis C 11/11/2016  . History of smoking 30 or more pack years 11/11/2016  . Alcohol abuse 10/08/2016  . Substance abuse (HCC) 10/08/2016  . Tobacco abuse 10/08/2016  . GAD (generalized anxiety disorder) 10/08/2016  . Controlled type 2 diabetes mellitus without complication, without long-term current use of insulin (HCC) 10/08/2016  . HLD (hyperlipidemia) 10/08/2016  . Essential hypertension 10/08/2016  . ED (erectile dysfunction) 10/08/2016    No past surgical history on file.     Home Medications    Prior to Admission medications   Medication Sig Start Date End Date Taking? Authorizing Provider  amLODipine (NORVASC) 10 MG tablet TK 1 T PO QD 01/28/17   Eustace Moore, MD  aspirin 325 MG EC tablet Take 325 mg by mouth daily.    [provider]  atorvastatin (LIPITOR) 10 MG tablet Take 1 tablet (10 mg total) by mouth daily. 04/04/17   Eustace Moore, MD  canagliflozin [facility]) 100  MG TABS tablet Take 1 tablet (100 mg total) by mouth daily before breakfast. 02/11/17   Eustace Moore, MD  losartan (COZAAR) 100 MG tablet Take 1 tablet (100 mg total) by mouth daily. 01/28/17   Eustace Moore, MD  metFORMIN (GLUCOPHAGE) 500 MG tablet Take 0.5 tablets (250 mg total) by mouth daily with breakfast. 11/11/16   Eustace Moore, MD    Family History Family History  Problem Relation Age of Onset  . Alzheimer's disease Mother   . Mental illness Mother   . Cancer Father        bladder?  [name] Hypertension Father   . Alcohol abuse Father   . Heart disease Sister   . Hearing loss Sister        valve  . Alcohol abuse Brother   . Diabetes Paternal Aunt   . Alcohol abuse Brother   . Alcohol abuse Brother   . Alcohol abuse Brother   . Early death Brother        MVA  . Cancer Maternal Grandmother     Social History Social History  Substance Use Topics  . Smoking status: Current Every Day Smoker    Packs/day: 1.00    Types: Cigarettes    Start date: 07/01/1961  . Smokeless tobacco: Never Used     Comment: using a patch  . Alcohol use No     Allergies   Patient has no  known allergies.   Review of Systems Review of Systems  Constitutional: Negative for appetite change.  Respiratory: Negative for shortness of breath.   Cardiovascular: Negative for chest pain.  Genitourinary: Negative for flank pain.  Musculoskeletal: Negative for back pain.  Neurological: Negative for weakness and numbness.  Hematological: Negative for adenopathy.  Psychiatric/Behavioral: Negative for confusion.     Physical Exam Updated Vital Signs BP 94/62 (BP Location: Right Leg)   Pulse 70   Temp 98.8 F (37.1 C) (Oral)   Resp 18   Ht  (1.778 m)   Wt 96.2 kg (212 lb)   SpO2 97%   BMI 30.42 kg/m   Physical Exam  Constitutional: He appears well-developed and well-nourished.  Patient smells of smoke  HENT:  Head: Normocephalic.  Cardiovascular: Normal rate.     Pulmonary/Chest: Effort normal. He has no wheezes.  Abdominal: There is no tenderness.  Musculoskeletal:  No edema of lower extremities. Left foot and lower leg is warmer than the contralateral. Somewhat pale on the right side. Has a dopplerable dorsalis pedis pulse however on the right side. Also has pulse on the left side with a Doppler. Sensation grossly intact. No tenderness over. No peripheral edema. Does have weak pulse in right popliteal area also. Has long thick toenails. Systolic blood pressure on right arm was 147. Systolic blood pressure in right ankle was 94. This would give rough ABI of 0.64     ED Treatments / Results  Labs (all labs ordered are listed, but only abnormal results are displayed) Labs Reviewed - No data to display  EKG  EKG Interpretation None       Radiology No results found.  Procedures Procedures (including critical care time)  Medications Ordered in ED Medications - No data to display   Initial Impression / Assessment and Plan / ED Course  I have reviewed the triage vital signs and the nursing notes.  Pertinent labs & imaging results that were available during my care of the patient were reviewed by me and considered in my medical decision making (see chart for details).    Patient with pain in his right calf. Likely peripheral arterial disease. ABI of 0.64. Did have some pain after a nap today. Doubt DVT. At this point I think he needsvascular follow-up. May need more venous imaging done either through primary care or by the vascular surgeon. Will discharge home to follow-up as an outpatient.  Final Clinical Impressions(s) / ED Diagnoses   Final diagnoses:  Claudication [facility])    New Prescriptions New Prescriptions   No medications on file     Benjiman Core, MD 04/12/17 1643

## 2017-04-12 NOTE — ED Notes (Signed)
Pedal pulses present by doppler bilaterally.

## 2017-04-25 ENCOUNTER — Other Ambulatory Visit: Payer: Self-pay | Admitting: Family Medicine

## 2017-04-28 NOTE — Telephone Encounter (Signed)
Seen [phone] 

## 2017-05-11 ENCOUNTER — Other Ambulatory Visit: Payer: Self-pay

## 2017-05-11 ENCOUNTER — Emergency Department (HOSPITAL_COMMUNITY)
Admission: EM | Admit: 2017-05-11 | Discharge: 2017-05-11 | Disposition: A | Discharge status: Home / Self Care | Payer: Medicare HMO | Attending: Emergency Medicine | Admitting: Emergency Medicine

## 2017-05-11 ENCOUNTER — Encounter (HOSPITAL_COMMUNITY): Payer: Self-pay

## 2017-05-11 DIAGNOSIS — Z5321 Procedure and treatment not carried out due to patient leaving prior to being seen by health care provider: Secondary | ICD-10-CM | POA: Insufficient documentation

## 2017-05-11 DIAGNOSIS — M79676 Pain in unspecified toe(s): Secondary | ICD-10-CM

## 2017-05-11 DIAGNOSIS — M79674 Pain in right toe(s): Secondary | ICD-10-CM | POA: Insufficient documentation

## 2017-05-11 NOTE — ED Notes (Signed)
PAtient called for room, no answer in lobby.

## 2017-05-11 NOTE — ED Notes (Signed)
Per registration staff, patient left.

## 2017-05-11 NOTE — ED Triage Notes (Signed)
Patient reports of pain/burning under 5th toe on right foot x2 weeks. No known injury. Patient is diabetic.

## 2017-05-14 ENCOUNTER — Encounter (HOSPITAL_COMMUNITY): Payer: Self-pay | Admitting: Emergency Medicine

## 2017-05-14 ENCOUNTER — Ambulatory Visit (INDEPENDENT_AMBULATORY_CARE_PROVIDER_SITE_OTHER): Payer: Medicare HMO | Admitting: Family Medicine

## 2017-05-14 ENCOUNTER — Encounter: Payer: Self-pay | Admitting: Family Medicine

## 2017-05-14 ENCOUNTER — Emergency Department (HOSPITAL_COMMUNITY): Payer: Medicare HMO

## 2017-05-14 ENCOUNTER — Emergency Department (HOSPITAL_COMMUNITY)
Admission: EM | Admit: 2017-05-14 | Discharge: 2017-05-14 | Disposition: A | Discharge status: Home / Self Care | Payer: Medicare HMO | Attending: Emergency Medicine | Admitting: Emergency Medicine

## 2017-05-14 ENCOUNTER — Other Ambulatory Visit: Payer: Self-pay

## 2017-05-14 VITALS — BP 120/70 | HR 68 | Temp 98.0°F | Resp 18 | Ht 70.0 in | Wt 213.0 lb

## 2017-05-14 DIAGNOSIS — R200 Anesthesia of skin: Secondary | ICD-10-CM | POA: Diagnosis not present

## 2017-05-14 DIAGNOSIS — M79671 Pain in right foot: Secondary | ICD-10-CM

## 2017-05-14 DIAGNOSIS — E785 Hyperlipidemia, unspecified: Secondary | ICD-10-CM | POA: Diagnosis not present

## 2017-05-14 DIAGNOSIS — I10 Essential (primary) hypertension: Secondary | ICD-10-CM | POA: Insufficient documentation

## 2017-05-14 DIAGNOSIS — I998 Other disorder of circulatory system: Secondary | ICD-10-CM | POA: Diagnosis not present

## 2017-05-14 DIAGNOSIS — E119 Type 2 diabetes mellitus without complications: Secondary | ICD-10-CM

## 2017-05-14 DIAGNOSIS — Z7982 Long term (current) use of aspirin: Secondary | ICD-10-CM | POA: Insufficient documentation

## 2017-05-14 DIAGNOSIS — I779 Disorder of arteries and arterioles, unspecified: Secondary | ICD-10-CM | POA: Diagnosis not present

## 2017-05-14 DIAGNOSIS — Z7984 Long term (current) use of oral hypoglycemic drugs: Secondary | ICD-10-CM | POA: Insufficient documentation

## 2017-05-14 DIAGNOSIS — I739 Peripheral vascular disease, unspecified: Secondary | ICD-10-CM

## 2017-05-14 DIAGNOSIS — S99921A Unspecified injury of right foot, initial encounter: Secondary | ICD-10-CM | POA: Diagnosis not present

## 2017-05-14 DIAGNOSIS — F17210 Nicotine dependence, cigarettes, uncomplicated: Secondary | ICD-10-CM | POA: Insufficient documentation

## 2017-05-14 DIAGNOSIS — J449 Chronic obstructive pulmonary disease, unspecified: Secondary | ICD-10-CM | POA: Insufficient documentation

## 2017-05-14 DIAGNOSIS — Z79899 Other long term (current) drug therapy: Secondary | ICD-10-CM | POA: Insufficient documentation

## 2017-05-14 DIAGNOSIS — Z720 Tobacco use: Secondary | ICD-10-CM | POA: Diagnosis not present

## 2017-05-14 NOTE — Patient Instructions (Addendum)
Need to be seen today to get circulation back to the foot Need to go to the ER in .  If you cannot get a ride there, then [facility] to [facility] and they will send you.

## 2017-05-14 NOTE — Discharge Instructions (Signed)
Take your usual prescriptions as previously directed.  Go to the Vascular Surgeon's office tomorrow morning at 8:15am to be seen in follow up. You have decreased blood flow to your feet, it is very important you follow up with the Vascular Surgeon tomorrow morning, then are expecting you.  Return to the Emergency Department immediately sooner if worsening.

## 2017-05-14 NOTE — ED Provider Notes (Signed)
[facility]ENCY [facility]PARTMENT Provider Note   CSN: [id number] Arrival date & time: 05/14/17  0940     History   Chief Complaint Chief Complaint  Patient presents with  . Foot Pain    HPI Alex Marks is a [age] y.o. male.  HPI  Pt was seen at 0950.  Per pt, c/o gradual onset and persistence of waxing and waning right foot "pain" for the past 2 months, worse over the past 2 to 3 weeks. Pt states the pain began in his right calf as a "muscle cramp" sensation, and then moved to his foot over the past 2 months.  Pt states the pain is improved when he places his foot in dependant position, increases with elevation.    Pt c/o "burning" pain in his right lateral foot area. Unsure regarding any injury, stating he "walks around in my socks a lot." Pt was evaluated in the ED 2 months ago for this complaint, dx claudication, and instructed to f/u with Vascular Surgeon. Pt states he did not do this, instead he went to his PMD's office this morning, then was sent to the ED for further evaluation. Denies fevers, no focal motor weakness, no tingling/numbness in extremities, no back pain, no abd pain, no open wounds.    Past Medical History:  Diagnosis Date  . COPD (chronic obstructive pulmonary disease) (HCC)   . Diabetes mellitus without complication (HCC)   . GAD (generalized anxiety disorder)   . Hep C w/o coma, chronic (HCC)   . Hypercholesterolemia   . Hypertension   . Substance abuse [facility](HCC)     Patient Active Problem List   Diagnosis Date Noted  . History of hepatitis C 11/11/2016  . History of smoking 30 or more pack years 11/11/2016  . Alcohol abuse 10/08/2016  . Substance abuse (HCC) 10/08/2016  . Tobacco abuse 10/08/2016  . GAD (generalized anxiety disorder) 10/08/2016  . Controlled type 2 diabetes mellitus without complication, without long-term current use of insulin (HCC) 10/08/2016  . HLD (hyperlipidemia) 10/08/2016  . Essential hypertension 10/08/2016  . ED (erectile  dysfunction) 10/08/2016    History reviewed. No pertinent surgical history.     Home Medications    Prior to Admission medications   Medication Sig Start Date End Date Taking? Authorizing Provider  amLODipine (NORVASC) 10 MG tablet TAKE 1 TABLET BY MOUTH EVERY DAY 04/28/17  Yes Eustace MooreNelson, Yvonne Sue, MD  atorvastatin (LIPITOR) 10 MG tablet Take 1 tablet (10 mg total) by mouth daily. 04/04/17  Yes Eustace MooreNelson, Yvonne Sue, MD  canagliflozin [facility](INVOKANA) 100 MG TABS tablet Take 1 tablet (100 mg total) by mouth daily before breakfast. 02/11/17  Yes Eustace MooreNelson, Yvonne Sue, MD  losartan (COZAAR) 100 MG tablet Take 1 tablet (100 mg total) by mouth daily. 01/28/17  Yes Eustace MooreNelson, Yvonne Sue, MD  metFORMIN (GLUCOPHAGE) 500 MG tablet Take 0.5 tablets (250 mg total) by mouth daily with breakfast. 11/11/16  Yes Eustace MooreNelson, Yvonne Sue, MD  aspirin 325 MG EC tablet Take 325 mg by mouth daily.    [provider]    Family History Family History  Problem Relation Age of Onset  . Alzheimer's disease Mother   . Mental illness Mother   . Cancer Father        bladder?  [name]. Hypertension Father   . Alcohol abuse Father   . Heart disease Sister   . Hearing loss Sister        valve  . Alcohol abuse Brother   .  Diabetes Paternal Aunt   . Alcohol abuse Brother   . Alcohol abuse Brother   . Alcohol abuse Brother   . Early death Brother        MVA  . Cancer Maternal Grandmother     Social History Social History   Tobacco Use  . Smoking status: Current Every Day Smoker    Packs/day: 1.00    Types: Cigarettes    Start date: 07/01/1961  . Smokeless tobacco: Never Used  . Tobacco comment: using a patch  Substance Use Topics  . Alcohol use: No  . Drug use: No    Comment: recovered     Allergies   Patient has no known allergies.   Review of Systems Review of Systems ROS: Statement: All systems negative except as marked or noted in the HPI; Constitutional: Negative for fever and chills. ; ; Eyes: Negative  for eye pain, redness and discharge. ; ; ENMT: Negative for ear pain, hoarseness, nasal congestion, sinus pressure and sore throat. ; ; Cardiovascular: Negative for chest pain, palpitations, diaphoresis, dyspnea and peripheral edema. ; ; Respiratory: Negative for cough, wheezing and stridor. ; ; Gastrointestinal: Negative for nausea, vomiting, diarrhea, abdominal pain, blood in stool, hematemesis, jaundice and rectal bleeding. . ; ; Genitourinary: Negative for dysuria, flank pain and hematuria. ; ; Musculoskeletal: +right foot pain. Negative for back pain and neck pain. Negative for swelling and deformity.; ; Skin: Negative for pruritus, rash, abrasions, blisters, bruising and skin lesion.; ; Neuro: Negative for headache, lightheadedness and neck stiffness. Negative for weakness, altered level of consciousness, altered mental status, extremity weakness, paresthesias, involuntary movement, seizure and syncope.      Physical Exam Updated Vital Signs BP 133/78 (BP Location: Left Arm)   Pulse 62   Temp 98 F (36.7 C) (Oral)   Resp 18   Ht 5\' 10"  (1.778 m)   Wt 95.3 kg (210 lb)   SpO2 99%   BMI 30.13 kg/m   Physical Exam 0955: Physical examination:  Nursing notes reviewed; Vital signs and O2 SAT reviewed;  Constitutional: Well developed, Well nourished, Well hydrated, In no acute distress; Head:  Normocephalic, atraumatic; Eyes: EOMI, PERRL, No scleral icterus; ENMT: Mouth and pharynx normal, Mucous membranes moist; Neck: Supple, Full range of motion, No lymphadenopathy; Cardiovascular: Regular rate and rhythm, No gallop; Respiratory: Breath sounds clear & equal bilaterally, No wheezes.  Speaking full sentences with ease, Normal respiratory effort/excursion; Chest: Nontender, Movement normal; Abdomen: Soft, Nontender, Nondistended, Normal bowel sounds; Genitourinary: No CVA tenderness; Extremities: +right foot dependent rubor, slightly cooler than left, right distal 5th metatarsal area with localized  tenderness and small darkened area of skin. +doppler pedal pulses. No open wounds, No edema, No deformity, no rash. No calf tenderness, edema or asymmetry. Thickened/long toenails bilat.; Neuro: AA&Ox3, Major CN grossly intact.  Speech clear. No gross focal motor deficits in extremities. Sensation grossly intact..; Skin: Color normal, Warm, Dry.   ED Treatments / Results  Labs (all labs ordered are listed, but only abnormal results are displayed)   EKG  EKG Interpretation None       Radiology   Procedures Procedures (including critical care time)  Medications Ordered in ED Medications - No data to display   Initial Impression / Assessment and Plan / ED Course  I have reviewed the triage vital signs and the nursing notes.  Pertinent labs & imaging results that were available during my care of the patient were reviewed by me and considered in my medical decision  making (see chart for details).  MDM Reviewed: previous chart, nursing note and vitals Interpretation: ultrasound and x-ray       Us Arterial Abi (screening Lower Extremity) ResulKoreat Date: 05/14/2017 CLINICAL DATA:  Worsening right foot pain since injury 2-3 weeks ago. History of smoking, hypertension, hyperlipidemia and diabetes. Evaluate for PAD. EXAM: NONINVASIVE PHYSIOLOGIC VASCULAR STUDY OF BILATERAL LOWER EXTREMITIES TECHNIQUE: Evaluation of both lower extremities were performed at rest, including calculation of ankle-brachial indices with single level Doppler, pressure and pulse volume recording. COMPARISON:  Chest CT- 11/20/2016 FINDINGS: Right ABI:  0.58 Left ABI:  0.83 Right Lower Extremity: Blunted abnormal waveforms are demonstrated at the level of both the right posterior tibial and dorsalis pedis arteries. Left Lower Extremity: Blunted monophasic waveforms are seen at the level of both the left posterior tibial and dorsalis pedis arteries. ABI reference: >1.4: Non diagnostic secondary to incompressible  vessel calcifications (medial arterial sclerosis of [place]) 1.0-1.4: Normal 0.9-0.99: Borderline PAD 0.8-0.89: Mild PAD 0.5-0.79: Moderate PAD < 0.5: Severe PAD IMPRESSION: At least moderate right, and mild left-sided hemodynamically significant vasoocclusive disease is suspected on this resting examination. Further evaluation with CTA run-off could be performed as clinically indicated. Electronically Signed   By: Simonne ComeJohn  Watts M.D.   On: 05/14/2017 12:26   Dg Foot Complete Right Result Date: 05/14/2017 CLINICAL DATA:  Pain and numbness.  Diabetes mellitus. EXAM: RIGHT FOOT COMPLETE - 3+ VIEW COMPARISON:  None. FINDINGS: Frontal, oblique, and lateral views were obtained. No acute fracture or dislocation. There is marked arthropathy at the medial cuneiform -second metatarsal joint. There is bony remodeling along the proximal second metatarsal. Other joint spaces appear unremarkable. No erosive change. There is a small inferior calcaneal spur. There are foci of arterial vascular calcification. There is pes spinous. 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 Question degree of neuropathic change in the midfoot region. No acute fracture or dislocation. No bony destruction. Other joint spaces appear unremarkable. Foci of arteriovascular calcification are likely due to known diabetes mellitus. There is pes planus. Electronically Signed   By: Bretta BangWilliam  Woodruff III M.D.   On: 05/14/2017 10:26     1215:  Previous ED visit for similar complaint was 04/12/2017: ABI in ED was 0.64. Today's as above. Right foot has rubor, slightly cool compared to left foot, and pulses are obtained by Doppler. This overall appears similar to previous ED visit.  T/C to Vascular Dr. Darrick PennaFields, case discussed, including:  HPI, pertinent PM/SHx, VS/PE, dx testing, ED course and treatment:  Agrees no acute surgical issue at this time, but pt does  need timely f/u, have pt come to office for f/u appt tomorrow morning at 0815am.  ED RN and I have spoken with pt regarding his dx testing results, stressed the importance of f/u with Vasc MD tomorrow morning, as well as risks if he continues not to f/u with Vasc MD. Pt verb understanding and gave friend's phone number to ED RN who will assist arranging a ride for tomorrow. Dx and testing d/w pt.  Questions answered.  Verb understanding, agreeable to d/c home with outpt f/u tomorrow morning.                      Final Clinical Impressions(s) / ED Diagnoses   Final diagnoses:  Ischemic    ED Discharge Orders    None       Samuel JesterMcManus, Shaquinta Peruski, DO 05/16/17 1410

## 2017-05-14 NOTE — Progress Notes (Signed)
Chief Complaint  Patient presents with  . Foot Injury    right 2-3 weeks  Patient is here today for an urgent visit for right foot pain.  He states his been progressively worsening for the last 2-3 weeks.  He was seen in the emergency room on April 12, 2017 for calf pain.  He was diagnosed with claudication.  He had a cool right foot but a Doppler detected a pulse.  He was told to follow-up with primary care and needed a vascular workup.  He has multiple vascular risk factors including cigarette smoking hypertension hyperlipidemia and diabetes.  He has been advised to quit smoking, but has been unable.  His diabetes is well controlled, his last A1c was 6.2. He is here today with worsening foot pain.  Unfortunately he did not follow-up after his emergency room visit.  It hurts with any pressure or ambulation.  He states it feels better when it is hanging down than when he elevates the foot. When he was here last it was recommended he see podiatry because he has calluses, onychomycosis, and thick deformed nails.  He is not yet made this appointment.  Patient Active Problem List   Diagnosis Date Noted  . History of hepatitis C 11/11/2016  . History of smoking 30 or more pack years 11/11/2016  . Alcohol abuse 10/08/2016  . Substance abuse (HCC) 10/08/2016  . Tobacco abuse 10/08/2016  . GAD (generalized anxiety disorder) 10/08/2016  . Controlled type 2 diabetes mellitus without complication, without long-term current use of insulin (HCC) 10/08/2016  . HLD (hyperlipidemia) 10/08/2016  . Essential hypertension 10/08/2016  . ED (erectile dysfunction) 10/08/2016    Outpatient Encounter Medications as of 05/14/2017  Medication Sig  . amLODipine (NORVASC) 10 MG tablet TAKE 1 TABLET BY MOUTH EVERY DAY  . aspirin 325 MG EC tablet Take 325 mg by mouth daily.  [name]. atorvastatin (LIPITOR) 10 MG tablet Take 1 tablet (10 mg total) by mouth daily.  . canagliflozin (INVOKANA) 100 MG TABS tablet Take 1  tablet (100 mg total) by mouth daily before breakfast.  . losartan (COZAAR) 100 MG tablet Take 1 tablet (100 mg total) by mouth daily.  . metFORMIN (GLUCOPHAGE) 500 MG tablet Take 0.5 tablets (250 mg total) by mouth daily with breakfast.   No facility-administered encounter medications on file as of 05/14/2017.     No Known Allergies  Review of Systems  Constitutional: Negative for activity change and appetite change.  HENT: Negative for congestion and dental problem.   Eyes: Negative for photophobia and visual disturbance.  Respiratory: Negative for cough and shortness of breath.   Cardiovascular: Negative for chest pain, palpitations and leg swelling.  Gastrointestinal: Negative for blood in stool, constipation and diarrhea.  Genitourinary: Negative for dysuria and frequency.  Musculoskeletal: Positive for gait problem. Negative for arthralgias.  Skin: Positive for color change.       Right foot is dusky  Neurological: Negative for dizziness and headaches.  Psychiatric/Behavioral: Negative for sleep disturbance.    BP 120/70 (BP Location: Right Arm, Patient Position: Sitting, Cuff Size: Large)   Pulse 68   Temp 98 F (36.7 C) (Temporal)   Resp 18   Ht 5\' 10"  (1.778 m)   Wt 213 lb (96.6 kg)   SpO2 99%   BMI 30.56 kg/m   Physical Exam  Constitutional: He is oriented to person, place, and time. He appears well-developed and well-nourished. No distress.  Smells of tobacco  HENT:  Head: Normocephalic  and atraumatic.  Mouth/Throat: Oropharynx is clear and moist.  Cardiovascular: Normal rate, regular rhythm and normal heart sounds.  Pulmonary/Chest: Effort normal and breath sounds normal.  Musculoskeletal: He exhibits no edema.  Feet have slight diminished sensation, numerous calluses, dystrophic long toenails that are deformed and curled.  His right foot has a erythematous/dusky appearance distally.  On the lateral foot near the fifth MTP, there is a deep purple ischemic spot  that is very tender to touch.  I cannot palpate any pedal pulses.  Doppler not available.  Neurological: He is alert and oriented to person, place, and time. He displays normal reflexes. Coordination normal.  Psychiatric: He has a normal mood and affect. His behavior is normal.    ASSESSMENT/PLAN:  1. Ischemia of foot Progressive over the last several days.  2. PAD (peripheral artery disease) (HCC) Diagnosed in the emergency room with Dopplers and ABI of 0.64.  3. Tobacco abuse Unable to quit  4. Controlled type 2 diabetes mellitus without complication, without long-term current use of insulin (HCC) A1c 6.2  5. Essential hypertension Controlled  6. Hyperlipidemia, unspecified hyperlipidemia type LDL 61   Patient Instructions  Need to be seen today to get circulation back to the foot  [facility] the emergency department at [facility].  They informed me that if he has an ischemic foot they will put him in an ambulance [place] to [facility].  I am trying to arrange transportation and follow-up with a vascular specialist in [place]. I called the vascular office and they recommend he go to the ER  Eustace MooreYvonne Sue Calinda Stockinger, MD

## 2017-05-14 NOTE — ED Triage Notes (Addendum)
PT c/o pain/redness to right foot worsening x2 weeks. PT states Dr. Delton SeeNelson seen him at the office this am and referred him to ED for follow up. PT states he doesn't have transportation to follow up with a vascular doctor that he was referred to.

## 2017-05-15 ENCOUNTER — Ambulatory Visit (INDEPENDENT_AMBULATORY_CARE_PROVIDER_SITE_OTHER): Payer: Medicare HMO | Admitting: Vascular Surgery

## 2017-05-15 ENCOUNTER — Encounter: Payer: Self-pay | Admitting: Vascular Surgery

## 2017-05-15 ENCOUNTER — Encounter: Payer: Self-pay | Admitting: *Deleted

## 2017-05-15 VITALS — BP 115/78 | HR 61 | Temp 98.4°F | Resp 20 | Ht 70.0 in | Wt 212.0 lb

## 2017-05-15 DIAGNOSIS — I739 Peripheral vascular disease, unspecified: Secondary | ICD-10-CM

## 2017-05-15 NOTE — H&P (View-Only) (Signed)
Referring Physician: Dr Clarene DukeMcManus  Patient name: Alex Marks MRN: [id number] DOB: 1946/03/31 Sex: male  REASON FOR CONSULT: Peripheral arterial disease  HPI: Alex Marks is a 71 y.o. male,  with a 1-2 month history of pain in his right calf with ambulation and some stiffness. He also has pain in his foot at nighttime but this is slightly relieved by applying icy hot. He denies any ulcerations on his feet. He does currently smoke about a pack of cigarettes per day. He is trying to quit with a nicotine patch. Other medical problems include COPD, diabetes, hepatitis C, elevated cholesterol, hypertension. These are all currently stable. He is on Lipitor and aspirin.  Past Medical History:  Diagnosis Date  . COPD (chronic obstructive pulmonary disease) (HCC)   . Diabetes mellitus without complication (HCC)   . GAD (generalized anxiety disorder)   . Hep C w/o coma, chronic (HCC)   . Hypercholesterolemia   . Hypertension   . Substance abuse (HCC)    History reviewed. No pertinent surgical history.  Family History  Problem Relation Age of Onset  . Alzheimer's disease Mother   . Mental illness Mother   . Cancer Father        bladder?  [name]. Hypertension Father   . Alcohol abuse Father   . Heart disease Sister   . Hearing loss Sister        valve  . Alcohol abuse Brother   . Diabetes Paternal Aunt   . Alcohol abuse Brother   . Alcohol abuse Brother   . Alcohol abuse Brother   . Early death Brother        MVA  . Cancer Maternal Grandmother     SOCIAL HISTORY: Social History   Socioeconomic History  . Marital status: Single    Spouse name: Not on file  . Number of children: 0  . Years of education: 7911  . Highest education level: Not on file  Social Needs  . Financial resource strain: Not on file  . Food insecurity - worry: Not on file  . Food insecurity - inability: Not on file  . Transportation needs - medical: Not on file  . Transportation needs - non-medical: Not on  file  Occupational History  . Occupation: retired    Comment: labor  Tobacco Use  . Smoking status: Current Every Day Smoker    Packs/day: 1.00    Types: Cigarettes    Start date: 07/01/1961  . Smokeless tobacco: Never Used  . Tobacco comment: using a patch  Substance and Sexual Activity  . Alcohol use: No  . Drug use: No    Comment: recovered  . Sexual activity: Not Currently  Other Topics Concern  . Not on file  Social History Narrative   Live in boarding house   Renting a room   looking for apartment   Never had license   Likes to read    No Known Allergies  Current Outpatient Medications  Medication Sig Dispense Refill  . amLODipine (NORVASC) 10 MG tablet TAKE 1 TABLET BY MOUTH EVERY DAY 90 tablet 3  . aspirin 325 MG EC tablet Take 325 mg by mouth daily.    [name]. atorvastatin (LIPITOR) 10 MG tablet Take 1 tablet (10 mg total) by mouth daily. 90 tablet 3  . canagliflozin (INVOKANA) 100 MG TABS tablet Take 1 tablet (100 mg total) by mouth daily before breakfast. 90 tablet 3  . losartan (COZAAR) 100 MG tablet Take 1 tablet (  100 mg total) by mouth daily. 90 tablet 3  . metFORMIN (GLUCOPHAGE) 500 MG tablet Take 0.5 tablets (250 mg total) by mouth daily with breakfast. 90 tablet 1   No current facility-administered medications for this visit.     ROS:   General:  No weight loss, Fever, chills  HEENT: No recent headaches, no nasal bleeding, no visual changes, no sore throat  Neurologic: No dizziness, blackouts, seizures. No recent symptoms of stroke or mini- stroke. No recent episodes of slurred speech, or temporary blindness.  Cardiac: No recent episodes of chest pain/pressure, no shortness of breath at rest.  + shortness of breath with exertion.  Denies history of atrial fibrillation or irregular heartbeat  Vascular: No history of rest pain in feet.  + history of claudication.  No history of non-healing ulcer, No history of DVT   Pulmonary: No home oxygen, no productive  cough, no hemoptysis,  No asthma or wheezing  Musculoskeletal:  [X] Arthritis, [X] Low back pain,  [X] Joint pain  Hematologic:No history of hypercoagulable state.  No history of easy bleeding.  No history of anemia  Gastrointestinal: No hematochezia or melena,  No gastroesophageal reflux, no trouble swallowing  Urinary: [ ] chronic Kidney disease, [ ] on HD - [ ] MWF or [ ] TTHS, [ ] Burning with urination, [ ] Frequent urination, [ ] Difficulty urinating;   Skin: No rashes  Psychological: No history of anxiety,  No history of depression   Physical Examination  Vitals:   05/15/17 0835  BP: 115/78  Pulse: 61  Resp: 20  Temp: 98.4 F (36.9 C)  TempSrc: Oral  SpO2: 97%  Weight: 212 lb (96.2 kg)  Height: 5' 10" (1.778 m)    Body mass index is 30.42 kg/m.  General:  Alert and oriented, no acute distress HEENT: Normal Neck: No bruit or JVD Pulmonary: Clear to auscultation bilaterally Cardiac: Regular Rate and Rhythm without murmur Abdomen: Soft, non-tender, non-distended, no mass, no scars Skin: No rash, right foot with dependent rubor Extremity Pulses:  2+ radial, brachial, femoral, absent popliteal dorsalis pedis, posterior tibial pulses bilaterally Musculoskeletal: No deformity or edema  Neurologic: Upper and lower extremity motor 5/5 and symmetric  DATA:  ABIs performed yesterday at [facility] were 0.58 on the right 0.83 on the left  ASSESSMENT:  Bilateral peripheral arterial disease right greater than left.   PLAN:  Abdominal aortogram with bilateral lower extremity runoff possible intervention. This was scheduled 05/30/2017. Risks benefits possible complications and procedure details including but limited to bleeding infection contrast reaction were discussed with the patient today. He understands and agrees to proceed.   Charles Fields, MD [facility] Office: [phone] Pager: [phone]   

## 2017-05-15 NOTE — Progress Notes (Signed)
Referring Physician: Dr Clarene DukeMcManus  Patient name: Alex Marks MRN: [id number] DOB: 1946/03/31 Sex: male  REASON FOR CONSULT: Peripheral arterial disease  HPI: Alex Marks is a 71 y.o. male,  with a 1-2 month history of pain in his right calf with ambulation and some stiffness. He also has pain in his foot at nighttime but this is slightly relieved by applying icy hot. He denies any ulcerations on his feet. He does currently smoke about a pack of cigarettes per day. He is trying to quit with a nicotine patch. Other medical problems include COPD, diabetes, hepatitis C, elevated cholesterol, hypertension. These are all currently stable. He is on Lipitor and aspirin.  Past Medical History:  Diagnosis Date  . COPD (chronic obstructive pulmonary disease) (HCC)   . Diabetes mellitus without complication (HCC)   . GAD (generalized anxiety disorder)   . Hep C w/o coma, chronic (HCC)   . Hypercholesterolemia   . Hypertension   . Substance abuse (HCC)    History reviewed. No pertinent surgical history.  Family History  Problem Relation Age of Onset  . Alzheimer's disease Mother   . Mental illness Mother   . Cancer Father        bladder?  [name]. Hypertension Father   . Alcohol abuse Father   . Heart disease Sister   . Hearing loss Sister        valve  . Alcohol abuse Brother   . Diabetes Paternal Aunt   . Alcohol abuse Brother   . Alcohol abuse Brother   . Alcohol abuse Brother   . Early death Brother        MVA  . Cancer Maternal Grandmother     SOCIAL HISTORY: Social History   Socioeconomic History  . Marital status: Single    Spouse name: Not on file  . Number of children: 0  . Years of education: 7911  . Highest education level: Not on file  Social Needs  . Financial resource strain: Not on file  . Food insecurity - worry: Not on file  . Food insecurity - inability: Not on file  . Transportation needs - medical: Not on file  . Transportation needs - non-medical: Not on  file  Occupational History  . Occupation: retired    Comment: labor  Tobacco Use  . Smoking status: Current Every Day Smoker    Packs/day: 1.00    Types: Cigarettes    Start date: 07/01/1961  . Smokeless tobacco: Never Used  . Tobacco comment: using a patch  Substance and Sexual Activity  . Alcohol use: No  . Drug use: No    Comment: recovered  . Sexual activity: Not Currently  Other Topics Concern  . Not on file  Social History Narrative   Live in boarding house   Renting a room   looking for apartment   Never had license   Likes to read    No Known Allergies  Current Outpatient Medications  Medication Sig Dispense Refill  . amLODipine (NORVASC) 10 MG tablet TAKE 1 TABLET BY MOUTH EVERY DAY 90 tablet 3  . aspirin 325 MG EC tablet Take 325 mg by mouth daily.    [name]. atorvastatin (LIPITOR) 10 MG tablet Take 1 tablet (10 mg total) by mouth daily. 90 tablet 3  . canagliflozin (INVOKANA) 100 MG TABS tablet Take 1 tablet (100 mg total) by mouth daily before breakfast. 90 tablet 3  . losartan (COZAAR) 100 MG tablet Take 1 tablet (  100 mg total) by mouth daily. 90 tablet 3  . metFORMIN (GLUCOPHAGE) 500 MG tablet Take 0.5 tablets (250 mg total) by mouth daily with breakfast. 90 tablet 1   No current facility-administered medications for this visit.     ROS:   General:  No weight loss, Fever, chills  HEENT: No recent headaches, no nasal bleeding, no visual changes, no sore throat  Neurologic: No dizziness, blackouts, seizures. No recent symptoms of stroke or mini- stroke. No recent episodes of slurred speech, or temporary blindness.  Cardiac: No recent episodes of chest pain/pressure, no shortness of breath at rest.  + shortness of breath with exertion.  Denies history of atrial fibrillation or irregular heartbeat  Vascular: No history of rest pain in feet.  + history of claudication.  No history of non-healing ulcer, No history of DVT   Pulmonary: No home oxygen, no productive  cough, no hemoptysis,  No asthma or wheezing  Musculoskeletal:  [X]  Arthritis, [X]  Low back pain,  [X]  Joint pain  Hematologic:No history of hypercoagulable state.  No history of easy bleeding.  No history of anemia  Gastrointestinal: No hematochezia or melena,  No gastroesophageal reflux, no trouble swallowing  Urinary: [ ]  chronic Kidney disease, [ ]  on HD - [ ]  MWF or [ ]  TTHS, [ ]  Burning with urination, [ ]  Frequent urination, [ ]  Difficulty urinating;   Skin: No rashes  Psychological: No history of anxiety,  No history of depression   Physical Examination  Vitals:   05/15/17 0835  BP: 115/78  Pulse: 61  Resp: 20  Temp: 98.4 F (36.9 C)  TempSrc: Oral  SpO2: 97%  Weight: 212 lb (96.2 kg)  Height: 5\' 10"  (1.778 m)    Body mass index is 30.42 kg/m.  General:  Alert and oriented, no acute distress HEENT: Normal Neck: No bruit or JVD Pulmonary: Clear to auscultation bilaterally Cardiac: Regular Rate and Rhythm without murmur Abdomen: Soft, non-tender, non-distended, no mass, no scars Skin: No rash, right foot with dependent rubor Extremity Pulses:  2+ radial, brachial, femoral, absent popliteal dorsalis pedis, posterior tibial pulses bilaterally Musculoskeletal: No deformity or edema  Neurologic: Upper and lower extremity motor 5/5 and symmetric  DATA:  ABIs performed yesterday at [facility] were 0.58 on the right 0.83 on the left  ASSESSMENT:  Bilateral peripheral arterial disease right greater than left.   PLAN:  Abdominal aortogram with bilateral lower extremity runoff possible intervention. This was scheduled 05/30/2017. Risks benefits possible complications and procedure details including but limited to bleeding infection contrast reaction were discussed with the patient today. He understands and agrees to proceed.   Fabienne Brunsharles Fields, MD [facility] [place] Office: [phone] Pager: [phone]

## 2017-05-27 ENCOUNTER — Other Ambulatory Visit: Payer: Self-pay | Admitting: *Deleted

## 2017-05-30 ENCOUNTER — Encounter (HOSPITAL_COMMUNITY): Payer: Self-pay | Admitting: General Practice

## 2017-05-30 ENCOUNTER — Encounter (HOSPITAL_COMMUNITY)
Admission: RE | Disposition: A | Discharge status: Home / Self Care | Payer: Self-pay | Source: Ambulatory Visit | Attending: Vascular Surgery

## 2017-05-30 ENCOUNTER — Other Ambulatory Visit: Payer: Self-pay

## 2017-05-30 ENCOUNTER — Telehealth: Payer: Self-pay | Admitting: Vascular Surgery

## 2017-05-30 ENCOUNTER — Observation Stay (HOSPITAL_COMMUNITY)
Admission: RE | Admit: 2017-05-30 | Discharge: 2017-05-31 | Disposition: A | Discharge status: Home / Self Care | Payer: Medicare HMO | Source: Ambulatory Visit | Attending: Vascular Surgery | Admitting: Vascular Surgery

## 2017-05-30 DIAGNOSIS — B182 Chronic viral hepatitis C: Secondary | ICD-10-CM | POA: Diagnosis not present

## 2017-05-30 DIAGNOSIS — I739 Peripheral vascular disease, unspecified: Secondary | ICD-10-CM | POA: Diagnosis present

## 2017-05-30 DIAGNOSIS — F17210 Nicotine dependence, cigarettes, uncomplicated: Secondary | ICD-10-CM | POA: Insufficient documentation

## 2017-05-30 DIAGNOSIS — J449 Chronic obstructive pulmonary disease, unspecified: Secondary | ICD-10-CM | POA: Insufficient documentation

## 2017-05-30 DIAGNOSIS — E1151 Type 2 diabetes mellitus with diabetic peripheral angiopathy without gangrene: Secondary | ICD-10-CM | POA: Diagnosis not present

## 2017-05-30 DIAGNOSIS — K219 Gastro-esophageal reflux disease without esophagitis: Secondary | ICD-10-CM | POA: Insufficient documentation

## 2017-05-30 DIAGNOSIS — F329 Major depressive disorder, single episode, unspecified: Secondary | ICD-10-CM | POA: Diagnosis not present

## 2017-05-30 DIAGNOSIS — Z7984 Long term (current) use of oral hypoglycemic drugs: Secondary | ICD-10-CM | POA: Diagnosis not present

## 2017-05-30 DIAGNOSIS — E7800 Pure hypercholesterolemia, unspecified: Secondary | ICD-10-CM | POA: Diagnosis not present

## 2017-05-30 DIAGNOSIS — Z7982 Long term (current) use of aspirin: Secondary | ICD-10-CM | POA: Insufficient documentation

## 2017-05-30 DIAGNOSIS — I10 Essential (primary) hypertension: Secondary | ICD-10-CM | POA: Insufficient documentation

## 2017-05-30 DIAGNOSIS — I70213 Atherosclerosis of native arteries of extremities with intermittent claudication, bilateral legs: Secondary | ICD-10-CM | POA: Insufficient documentation

## 2017-05-30 DIAGNOSIS — F411 Generalized anxiety disorder: Secondary | ICD-10-CM | POA: Diagnosis not present

## 2017-05-30 HISTORY — DX: Type 2 diabetes mellitus without complications: E11.9

## 2017-05-30 HISTORY — DX: Major depressive disorder, single episode, unspecified: F32.9

## 2017-05-30 HISTORY — DX: Depression, unspecified: F32.A

## 2017-05-30 HISTORY — DX: Peripheral vascular disease, unspecified: I73.9

## 2017-05-30 HISTORY — DX: Procedure and treatment not carried out because of patient's decision for reasons of belief and group pressure: Z53.1

## 2017-05-30 HISTORY — DX: Unspecified viral hepatitis C without hepatic coma: B19.20

## 2017-05-30 HISTORY — PX: ABDOMINAL AORTOGRAM W/LOWER EXTREMITY: CATH118223

## 2017-05-30 HISTORY — DX: Gastro-esophageal reflux disease without esophagitis: K21.9

## 2017-05-30 HISTORY — DX: Frequency of micturition: R35.0

## 2017-05-30 LAB — GLUCOSE, CAPILLARY: Glucose-Capillary: 90 mg/dL (ref 65–99)

## 2017-05-30 LAB — POCT I-STAT, CHEM 8
BUN: 13 mg/dL (ref 6–20)
CALCIUM ION: 1.17 mmol/L (ref 1.15–1.40)
CHLORIDE: 105 mmol/L (ref 101–111)
Creatinine, Ser: 0.7 mg/dL (ref 0.61–1.24)
GLUCOSE: 109 mg/dL — AB (ref 65–99)
HCT: 47 % (ref 39.0–52.0)
Hemoglobin: 16 g/dL (ref 13.0–17.0)
Potassium: 3.8 mmol/L (ref 3.5–5.1)
Sodium: 140 mmol/L (ref 135–145)
TCO2: 21 mmol/L — ABNORMAL LOW (ref 22–32)

## 2017-05-30 SURGERY — ABDOMINAL AORTOGRAM W/LOWER EXTREMITY
Anesthesia: LOCAL

## 2017-05-30 MED ORDER — MORPHINE SULFATE (PF) 10 MG/ML IV SOLN: 2.0000 mg | INTRAVENOUS | Status: DC | PRN

## 2017-05-30 MED ORDER — ADULT MULTIVITAMIN W/MINERALS CH
1.0000 | ORAL_TABLET | Freq: Every day | ORAL | Status: DC
  Administered 2017-05-30 – 2017-05-31 (×2): 1 via ORAL
  Filled 2017-05-30 (×2): qty 1

## 2017-05-30 MED ORDER — IODIXANOL 320 MG/ML IV SOLN
INTRAVENOUS | Status: DC | PRN
  Administered 2017-05-30: 115 mL via INTRA_ARTERIAL

## 2017-05-30 MED ORDER — LABETALOL HCL 5 MG/ML IV SOLN: 10.0000 mg | INTRAVENOUS | Status: DC | PRN

## 2017-05-30 MED ORDER — OXYCODONE HCL 5 MG PO TABS
ORAL_TABLET | ORAL | Status: AC
  Filled 2017-05-30: qty 2

## 2017-05-30 MED ORDER — ONDANSETRON HCL 4 MG/2ML IJ SOLN: 4.0000 mg | Freq: Four times a day (QID) | INTRAMUSCULAR | Status: DC | PRN

## 2017-05-30 MED ORDER — ATORVASTATIN CALCIUM 10 MG PO TABS
10.0000 mg | ORAL_TABLET | Freq: Every day | ORAL | Status: DC
  Administered 2017-05-30: 10 mg via ORAL
  Filled 2017-05-30: qty 1

## 2017-05-30 MED ORDER — SODIUM CHLORIDE 0.9 % IV SOLN: 250.0000 mL | INTRAVENOUS | Status: DC | PRN

## 2017-05-30 MED ORDER — ACETAMINOPHEN 325 MG PO TABS: 650.0000 mg | ORAL_TABLET | ORAL | Status: DC | PRN

## 2017-05-30 MED ORDER — LIDOCAINE HCL (PF) 1 % IJ SOLN
INTRAMUSCULAR | Status: DC | PRN
  Administered 2017-05-30: 18 mL

## 2017-05-30 MED ORDER — LIDOCAINE HCL (PF) 1 % IJ SOLN
INTRAMUSCULAR | Status: AC
  Filled 2017-05-30: qty 30

## 2017-05-30 MED ORDER — SODIUM CHLORIDE 0.9% FLUSH: 3.0000 mL | Freq: Two times a day (BID) | INTRAVENOUS | Status: DC

## 2017-05-30 MED ORDER — HYDRALAZINE HCL 20 MG/ML IJ SOLN: 5.0000 mg | INTRAMUSCULAR | Status: DC | PRN

## 2017-05-30 MED ORDER — SODIUM CHLORIDE 0.9% FLUSH: 3.0000 mL | INTRAVENOUS | Status: DC | PRN

## 2017-05-30 MED ORDER — LOSARTAN POTASSIUM 50 MG PO TABS
100.0000 mg | ORAL_TABLET | Freq: Every day | ORAL | Status: DC
  Administered 2017-05-30 – 2017-05-31 (×2): 100 mg via ORAL
  Filled 2017-05-30 (×2): qty 2

## 2017-05-30 MED ORDER — HEPARIN (PORCINE) IN NACL 2-0.9 UNIT/ML-% IJ SOLN
INTRAMUSCULAR | Status: AC | PRN
  Administered 2017-05-30: 1000 mL

## 2017-05-30 MED ORDER — AMLODIPINE BESYLATE 10 MG PO TABS
10.0000 mg | ORAL_TABLET | Freq: Every day | ORAL | Status: DC
  Administered 2017-05-30: 10 mg via ORAL
  Filled 2017-05-30 (×2): qty 1

## 2017-05-30 MED ORDER — OXYCODONE HCL 5 MG PO TABS
5.0000 mg | ORAL_TABLET | ORAL | Status: DC | PRN
  Administered 2017-05-30: 10 mg via ORAL
  Filled 2017-05-30: qty 2

## 2017-05-30 MED ORDER — SODIUM CHLORIDE 0.9 % IV SOLN: INTRAVENOUS | Status: AC

## 2017-05-30 MED ORDER — HEPARIN (PORCINE) IN NACL 2-0.9 UNIT/ML-% IJ SOLN
INTRAMUSCULAR | Status: AC
  Filled 2017-05-30: qty 1000

## 2017-05-30 MED ORDER — CANAGLIFLOZIN 100 MG PO TABS
100.0000 mg | ORAL_TABLET | Freq: Every day | ORAL | Status: DC
  Administered 2017-05-31: 100 mg via ORAL
  Filled 2017-05-30: qty 1

## 2017-05-30 MED ORDER — ASPIRIN EC 325 MG PO TBEC
325.0000 mg | DELAYED_RELEASE_TABLET | Freq: Every day | ORAL | Status: DC
  Administered 2017-05-30 – 2017-05-31 (×2): 325 mg via ORAL
  Filled 2017-05-30 (×2): qty 1

## 2017-05-30 MED ORDER — SODIUM CHLORIDE 0.9 % IV SOLN
INTRAVENOUS | Status: DC
  Administered 2017-05-30: 07:00:00 via INTRAVENOUS

## 2017-05-30 SURGICAL SUPPLY — 9 items
CATH ANGIO 5F PIGTAIL 65CM (CATHETERS) ×2 IMPLANT
COVER PRB 48X5XTLSCP FOLD TPE (BAG) ×1 IMPLANT
COVER PROBE 5X48 (BAG) ×1
KIT PV (KITS) ×2 IMPLANT
SHEATH PINNACLE 5F 10CM (SHEATH) ×2 IMPLANT
SYR MEDRAD MARK V 150ML (SYRINGE) ×2 IMPLANT
TRANSDUCER W/STOPCOCK (MISCELLANEOUS) ×2 IMPLANT
TRAY PV CATH (CUSTOM PROCEDURE TRAY) ×2 IMPLANT
WIRE HITORQ VERSACORE ST 145CM (WIRE) ×2 IMPLANT

## 2017-05-30 NOTE — Telephone Encounter (Signed)
-----   Message from Sharee PimpleMarilyn K McChesney, RN sent at 05/30/2017  9:40 AM EST ----- Regarding: Card.Clearance; rt saphenous vein map; appt w/ [facility] to discuss surgery   ----- Message ----- From: Sherren KernsFields, Charles E, MD Sent: 05/30/2017   9:21 AM To: Vvs Charge Pool  Abdominal aortogram with bilateral lower extremity runoff, US left groin   Pt needs cardiology eval preop fem pop He needs right leg vein map He needs office visit with me after both of the above to consider bypass vs atherectomy  Fabienne Brunsharles Fields, MD [facility] [place] Office: [phone] Pager: [phone]

## 2017-05-30 NOTE — Interval H&P Note (Signed)
History and Physical Interval Note:  05/30/2017 7:29 AM  Alex PhillipsNorbert L Acheampong  has presented today for surgery, with the diagnosis of pvd - claudication  The various methods of treatment have been discussed with the patient and family. After consideration of risks, benefits and other options for treatment, the patient has consented to  Procedure(s): ABDOMINAL AORTOGRAM W/LOWER EXTREMITY (N/A) as a surgical intervention .  The patient's history has been reviewed, patient examined, no change in status, stable for surgery.  I have reviewed the patient's chart and labs.  Questions were answered to the patient's satisfaction.     Fabienne Brunsharles Fields

## 2017-05-30 NOTE — Telephone Encounter (Signed)
Sched cardiology appt at [facility] 06/25/17 at 8:40. Sched lab at 4:00 and MD at 11:15. Lm on hm#.

## 2017-05-30 NOTE — Op Note (Signed)
Procedure: Abdominal aortogram with bilateral lower extremity runoff  Preoperative diagnosis: Claudication  postoperative diagnosis: Same  Anesthesia: Local  Operative findings: #1 multiple areas of stenosis right superficial femoral artery 50-70%, 90% popliteal artery stenosis behind the knee joint, three-vessel runoff right foot  #2 multiple areas of stenosis left superficial femoral artery with calcification three-vessel runoff left foot  Operative details: After obtaining informed consent, patient brought to the PV lab. The patient was placed in supine position the Angio table. Both groins were prepped and draped in usual sterile fashion. Ultrasound was used to identify the left common femoral artery and femoral bifurcation. Local anesthesia was infiltrated over this. An introducer needle was used to cannulate the left common femoral artery under ultrasound guidance. An 035 versacore wire was then threaded up the abdominal aorta under fluoroscopic guidance. A 5 French sheath over the guidewire the left common femoral artery. This was thoroughly flushed with heparin saline. A 5 French pigtail catheter was advanced over the guidewire into the abdomen and an abdominal aortogram was obtained in AP projection. This shows a left and right renal arteries are widely patent. The infrarenal abdominal aorta is widely patent. The left and right common external and internal iliac arteries are widely patent.  Next the pigtail catheter was pulled down just above the aortic bifurcation and a pelvic angiogram obtained which confirmed the above findings. We then proceeded to do bilateral lower extremity runoff views through the pigtail catheter.  In the right lower extremity, the right common femoral and profunda femoris is patent. The right superficial femoral artery is patent. However there are 2 discrete stenoses one about 10 cm from the origin which is about 70% and an additional one with heavy calcification  near the adductor hiatus which is also about 50-70%. There is a 90% stenosis of the popliteal artery behind the knee joint. There is then three-vessel runoff to the right foot.  In the left lower extremity, the left common femoral profunda femoris is patent. Left superficial femoral artery has heavy calcification and 50-70% stenosis at the adductor level. The popliteal artery is patent with three-vessel runoff to the left foot.  At this point the pigtail catheter was removed over a guidewire. The sheath was thoroughly flushed with heparin saline. The patient tolerated the procedure well and there were no complications.  Operative management: The patient will be scheduled in the near future for either a right femoral below-knee popliteal bypass or consideration given for atherectomy and drug coated balloon angioplasty of the 3 discrete lesions in the right leg. This will be based on the patient's cardiac risk stratification and vein mapping ultrasound.  Fabienne Brunsharles Fields, MD [facility] [place] Office: [phone] Pager: [phone]

## 2017-05-30 NOTE — Progress Notes (Signed)
Site area: left groin fa sheath Site Prior to Removal:  Level 0 Pressure Applied For:  20 minutes Manual:   yes Patient Status During Pull:  stable Post Pull Site:  Level  0 Post Pull Instructions Given:  yes Post Pull Pulses Present: dopplered Dressing Applied:  Gauze and tegaderm Bedrest begins @  0940 Comments:

## 2017-05-30 NOTE — Progress Notes (Signed)
Pt states was going to take transportation home (some type that his insurance approved of...unaware of name) and that he does not have anyone to stay with him tonight at all.  Janne NapoleonJennifer O'Neal RN from cath lab notified who then notified Dr Darrick PennaFields and she reported to us that he said "okay"

## 2017-05-31 DIAGNOSIS — I70213 Atherosclerosis of native arteries of extremities with intermittent claudication, bilateral legs: Secondary | ICD-10-CM | POA: Diagnosis not present

## 2017-05-31 DIAGNOSIS — E1151 Type 2 diabetes mellitus with diabetic peripheral angiopathy without gangrene: Secondary | ICD-10-CM | POA: Diagnosis not present

## 2017-05-31 DIAGNOSIS — I10 Essential (primary) hypertension: Secondary | ICD-10-CM | POA: Diagnosis not present

## 2017-05-31 DIAGNOSIS — F17210 Nicotine dependence, cigarettes, uncomplicated: Secondary | ICD-10-CM | POA: Diagnosis not present

## 2017-05-31 DIAGNOSIS — J449 Chronic obstructive pulmonary disease, unspecified: Secondary | ICD-10-CM | POA: Diagnosis not present

## 2017-05-31 DIAGNOSIS — K219 Gastro-esophageal reflux disease without esophagitis: Secondary | ICD-10-CM | POA: Diagnosis not present

## 2017-05-31 DIAGNOSIS — F411 Generalized anxiety disorder: Secondary | ICD-10-CM | POA: Diagnosis not present

## 2017-05-31 DIAGNOSIS — E7800 Pure hypercholesterolemia, unspecified: Secondary | ICD-10-CM | POA: Diagnosis not present

## 2017-05-31 DIAGNOSIS — B182 Chronic viral hepatitis C: Secondary | ICD-10-CM | POA: Diagnosis not present

## 2017-05-31 MED ORDER — OXYCODONE HCL 5 MG PO TABS: 5.0000 mg | ORAL_TABLET | Freq: Every evening | ORAL | 0 refills | Status: DC | PRN

## 2017-05-31 NOTE — Progress Notes (Addendum)
[facility] [place]  Subjective  - Slept well, pain tablets help his burning foot pain.   Objective 128/70 (!) 56 98.4 F (36.9 C) (Oral) 15 96%  Intake/Output Summary (Last 24 hours) at 05/31/2017 16100723 Last data filed at 05/30/2017 2035 Gross per 24 hour  Intake -  Output 950 ml  Net -950 ml    Left groin soft without hematoma absent popliteal dorsalis pedis, posterior tibial pulses bilaterally   Assessment/Planning: POD #1  Abdominal aortogram with bilateral lower extremity runoff  Operative findings: #1 multiple areas of stenosis right superficial femoral artery 50-70%, 90% popliteal artery stenosis behind the knee joint, three-vessel runoff right foot  #2 multiple areas of stenosis left superficial femoral artery with calcification three-vessel runoff left foot  Operative management: The patient will be scheduled in the near future for either a right femoral below-knee popliteal bypass or consideration given for atherectomy and drug coated balloon angioplasty of the 3 discrete lesions in the right leg. This will be based on the patient's cardiac risk stratification and vein mapping ultrasound.  Discharge home in stable condition f/u with Dr. Darrick PennaFields in 2-3 weeks to discuss plan of care Percocet 5/325 #10 given at discharge     Alex Pigeonmma Maureen Collins 05/31/2017 7:23 AM --  Laboratory Lab Results: Recent Labs    05/30/17 0715  HGB 16.0  HCT 47.0   BMET Recent Labs    05/30/17 0715  NA 140  K 3.8  CL 105  GLUCOSE 109*  BUN 13  CREATININE 0.70    COAG No results found for: INR, PROTIME No results found for: PTT     I have independently interviewed and examined patient and agree with PA assessment and plan above. He is frustrated that he continues to have pain and inquired about the plan. He will keep his f/u with Dr. Darrick PennaFields to discuss possible bypass vs endovascular revascularization. Counseled on smoking  cessation.  Alex Mullings C. Randie Heinzain, MD [facility] [place] Office: [phone] Pager: [phone]

## 2017-05-31 NOTE — Plan of Care (Signed)
  Education: Knowledge of General Education information will improve 05/31/2017 0542 - Progressing by Olena Materobinson, Kalix Meinecke G, RN Note POC reviewed with pt.

## 2017-05-31 NOTE — Care Management Obs Status (Signed)
MEDICARE OBSERVATION STATUS NOTIFICATION   Patient Details  Name: Alex Marks MRN: [id number] Date of Birth: [birth year]   Medicare Observation Status Notification Given:  Yes    Verdene LennertGoldean, Christhoper Busbee K, RN 05/31/2017, 9:12 AM

## 2017-06-02 ENCOUNTER — Encounter (HOSPITAL_COMMUNITY): Payer: Self-pay | Admitting: Vascular Surgery

## 2017-06-03 ENCOUNTER — Telehealth: Payer: Self-pay | Admitting: Family Medicine

## 2017-06-03 NOTE — Discharge Summary (Signed)
[facility] Discharge Summary   Patient ID:  Alex Marks [place] MRN: [id number] DOB/AGE: 1945-08-04 71 y.o.  Admit date: 05/30/2017 Discharge date: 05/31/2017 Date of Surgery: 05/30/2017 Surgeon: Surgeon(s): Fields, Janetta Horaharles E, MD  Admission Diagnosis: PVD (peripheral vascular disease) [facility](HCC) [I73.9]  Discharge Diagnoses:  PVD (peripheral vascular disease) (HCC) [I73.9]  Secondary Diagnoses: Past Medical History:  Diagnosis Date  . COPD (chronic obstructive pulmonary disease) (HCC)   . Depression   . Frequency of urination   . GAD (generalized anxiety disorder)   . GERD (gastroesophageal reflux disease)   . Hepatitis C    "was treated; was cured after 3 month" (05/30/2017)  . Hypercholesterolemia   . Hypertension   . PAD (peripheral artery disease) (HCC)   . Substance abuse (HCC)   . Transfusion of blood product refused for religious reason   . Type II diabetes mellitus (HCC)     Procedure(s): ABDOMINAL AORTOGRAM W/LOWER EXTREMITY  Discharged Condition: stable  HPI: Alex Marks Dais is a 71 y.o. male,  with a 1-2 month history of pain in his right calf with ambulation and some stiffness. He also has pain in his foot at nighttime but this is slightly relieved by applying icy hot. He denies any ulcerations on his feet. He does currently smoke about a pack of cigarettes per day. He is trying to quit with a nicotine patch. Other medical problems include COPD, diabetes, hepatitis C, elevated cholesterol, hypertension. These are all currently stable. He is on Lipitor and aspirin.   DATA:  ABIs performed yesterday at [facility] were 0.58 on the right 0.83 on the left  ASSESSMENT:  Bilateral peripheral arterial disease right greater than left.    Hospital Course:  Alex Marks Lorman is a 71 y.o. male is S/P  Procedure(s): ABDOMINAL AORTOGRAM W/LOWER EXTREMITY   Left groin soft without hematoma absent poplitealdorsalis pedis, posterior tibial pulses  bilaterally   Assessment/Planning: POD #1  Abdominal aortogram with bilateral lower extremity runoff  Operative findings: #1 multiple areas of stenosis right superficial femoral artery 50-70%, 90% popliteal artery stenosis behind the knee joint, three-vessel runoff right foot  #2 multiple areas of stenosis left superficial femoral artery with calcification three-vessel runoff left foot  Operative management: The patient will be scheduled in the near future for either a right femoral below-knee popliteal bypass or consideration given for atherectomy and drug coated balloon angioplasty of the 3 discrete lesions in the right leg. This will be based on the patient's cardiac risk stratification and vein mapping ultrasound.  Discharge home in stable condition f/u with Dr. Darrick PennaFields in 2-3 weeks to discuss plan of care Percocet 5/325 #10 given at discharge    Significant Diagnostic Studies: CBC Lab Results  Component Value Date   WBC 11.1 (H) 10/09/2016   HGB 16.0 05/30/2017   HCT 47.0 05/30/2017   MCV 87.6 10/09/2016   PLT 219 10/09/2016    BMET    Component Value Date/Time   NA 140 05/30/2017 0715   K 3.8 05/30/2017 0715   CL 105 05/30/2017 0715   CO2 25 10/09/2016 1131   GLUCOSE 109 (H) 05/30/2017 0715   BUN 13 05/30/2017 0715   CREATININE 0.70 05/30/2017 0715   CREATININE 0.85 10/09/2016 1131   CALCIUM 9.1 10/09/2016 1131   COAG No results found for: INR, PROTIME   Disposition:  Discharge to :Home Discharge Instructions    Call MD for:  redness, tenderness, or signs of infection (pain, swelling, bleeding, redness, odor or  green/yellow discharge around incision site)   Complete by:  As directed    Call MD for:  severe or increased pain, loss or decreased feeling  in affected limb(s)   Complete by:  As directed    Call MD for:  temperature >100.5   Complete by:  As directed    Discharge instructions   Complete by:  As directed    Remove bandage and shower PRN    Driving Restrictions   Complete by:  As directed    No driving for 24 hours   Lifting restrictions   Complete by:  As directed    No heavy lifting for 2 weeks   Resume previous diet   Complete by:  As directed      Allergies as of 05/31/2017      Reactions   Onion    Bags under eyes, indigestion       Medication List    TAKE these medications   amLODipine 10 MG tablet Commonly known as:  NORVASC TAKE 1 TABLET BY MOUTH EVERY DAY Notes to patient:  Last dose 05/30/2017 @ 5:30pm   aspirin 325 MG EC tablet Take 325 mg by mouth daily. May take an additional 325 mgs as needed for sleep Notes to patient:  Last dose given 05/31/2017 @ 9:30am   aspirin-sod bicarb-citric acid 325 MG Tbef tablet Commonly known as:  ALKA-SELTZER Take 325 mg by mouth daily as needed (indigestion). Notes to patient:  No recent doses, please take as ordered.   atorvastatin 10 MG tablet Commonly known as:  LIPITOR Take 1 tablet (10 mg total) by mouth daily. Notes to patient:  Last dose 05/30/2017 @ 5:30pm   canagliflozin 100 MG Tabs tablet Commonly known as:  INVOKANA Take 1 tablet (100 mg total) by mouth daily before breakfast. Notes to patient:  Last dose 05/31/2017 @ 9:30am   hydrocortisone cream 1 % Apply 1 application topically daily as needed for itching. Notes to patient:  No recent doses, please take as ordered.   ICY HOT EX Apply 1 application topically daily as needed (muscle pain). Notes to patient:  No recent doses, please take as ordered.   losartan 100 MG tablet Commonly known as:  COZAAR Take 1 tablet (100 mg total) by mouth daily. Notes to patient:  Last dose 05/31/2017 @ 9:30am   metFORMIN 500 MG tablet Commonly known as:  GLUCOPHAGE Take 0.5 tablets (250 mg total) by mouth daily with breakfast. Notes to patient:  No recent doses, please take as ordered.   multivitamin with minerals Tabs tablet Take 1 tablet by mouth daily. Notes to patient:  Last dose 05/31/2017 @  9:30am   oxyCODONE 5 MG immediate release tablet Commonly known as:  Oxy IR/ROXICODONE Take 1-2 tablets (5-10 mg total) by mouth at bedtime as needed for moderate pain. Notes to patient:  Last dose 05/30/2017 @ 3:00pm   REDNESS RELIEF OP Place 1 drop into both eyes daily as needed (redness). Notes to patient:  No recent doses, please take as ordered.      Verbal and written Discharge instructions given to the patient. Wound care per Discharge AVS Follow-up Information    Sherren KernsFields, Charles E, MD Follow up in 2 week(s).   Specialties:  Vascular Surgery, Cardiology Why:  office will call with appt. Contact information: [address] [place] [address] [phone]           Signed: Mosetta Pigeonmma Maureen Hoa Deriso 06/03/2017, 9:30 AM

## 2017-06-04 ENCOUNTER — Other Ambulatory Visit: Payer: Self-pay

## 2017-06-04 DIAGNOSIS — I739 Peripheral vascular disease, unspecified: Secondary | ICD-10-CM

## 2017-06-16 ENCOUNTER — Other Ambulatory Visit: Payer: Self-pay

## 2017-06-16 ENCOUNTER — Telehealth: Payer: Self-pay | Admitting: Family Medicine

## 2017-06-16 NOTE — Telephone Encounter (Signed)
Patient states his feet are feeling better so he canceled his followup with vein and vascular specialist because he does not want surgery.  Cb# [phone]

## 2017-06-16 NOTE — Telephone Encounter (Signed)
noted 

## 2017-06-19 ENCOUNTER — Telehealth: Payer: Self-pay | Admitting: Family Medicine

## 2017-06-19 MED ORDER — BLOOD GLUCOSE MONITOR KIT: PACK | 0 refills | Status: DC

## 2017-06-19 NOTE — Telephone Encounter (Signed)
[employer] left message on nurse line requesting AccuCeck Aviva Plus meter, test strips and fast click lancets.

## 2017-06-19 NOTE — Telephone Encounter (Signed)
Done

## 2017-06-25 ENCOUNTER — Ambulatory Visit: Payer: Medicare HMO | Admitting: Internal Medicine

## 2017-06-25 ENCOUNTER — Other Ambulatory Visit: Payer: Self-pay

## 2017-07-04 ENCOUNTER — Encounter (HOSPITAL_COMMUNITY): Payer: Medicare HMO

## 2017-07-09 ENCOUNTER — Ambulatory Visit: Payer: Medicare HMO | Admitting: Family Medicine

## 2017-07-10 ENCOUNTER — Ambulatory Visit: Payer: Medicare HMO | Admitting: Vascular Surgery

## 2017-07-10 ENCOUNTER — Telehealth: Payer: Self-pay | Admitting: Family Medicine

## 2017-07-10 ENCOUNTER — Telehealth: Payer: Self-pay

## 2017-07-10 NOTE — Telephone Encounter (Signed)
Letter faxed to dentist.

## 2017-07-10 NOTE — Telephone Encounter (Signed)
Dr Katrinka BlazingSmith with [facility] is calling in to speak with Dr Delton SeeNelson regarding the patient, the Dr can be reached at [phone]

## 2017-07-10 NOTE — Telephone Encounter (Signed)
pts dental office called stating he need to have a tooth pulled. He takes asa 325 mg daily; they need to know 1- if he can stop this 2- for how many days prior...they are asking for a letter from you.

## 2017-07-10 NOTE — Telephone Encounter (Signed)
Do not need to stop ASA for one tooth

## 2017-07-11 DIAGNOSIS — I739 Peripheral vascular disease, unspecified: Secondary | ICD-10-CM | POA: Diagnosis not present

## 2017-07-11 DIAGNOSIS — L03031 Cellulitis of right toe: Secondary | ICD-10-CM | POA: Diagnosis not present

## 2017-07-11 DIAGNOSIS — L03032 Cellulitis of left toe: Secondary | ICD-10-CM | POA: Diagnosis not present

## 2017-07-11 DIAGNOSIS — L84 Corns and callosities: Secondary | ICD-10-CM | POA: Diagnosis not present

## 2017-07-11 DIAGNOSIS — L603 Nail dystrophy: Secondary | ICD-10-CM | POA: Diagnosis not present

## 2017-07-11 DIAGNOSIS — M7989 Other specified soft tissue disorders: Secondary | ICD-10-CM | POA: Diagnosis not present

## 2017-07-11 DIAGNOSIS — E1151 Type 2 diabetes mellitus with diabetic peripheral angiopathy without gangrene: Secondary | ICD-10-CM | POA: Diagnosis not present

## 2017-07-11 DIAGNOSIS — B351 Tinea unguium: Secondary | ICD-10-CM | POA: Diagnosis not present

## 2017-07-11 NOTE — Telephone Encounter (Signed)
Please fax Dr Katrinka BlazingSmith a snapshot sheet with med history and medications

## 2017-07-11 NOTE — Telephone Encounter (Signed)
Done

## 2017-07-15 DIAGNOSIS — B1920 Unspecified viral hepatitis C without hepatic coma: Secondary | ICD-10-CM | POA: Diagnosis not present

## 2017-08-15 ENCOUNTER — Other Ambulatory Visit: Payer: Self-pay | Admitting: Family Medicine

## 2017-09-08 ENCOUNTER — Encounter: Payer: Self-pay | Admitting: Family Medicine

## 2017-10-09 DIAGNOSIS — L603 Nail dystrophy: Secondary | ICD-10-CM | POA: Diagnosis not present

## 2017-10-09 DIAGNOSIS — E1151 Type 2 diabetes mellitus with diabetic peripheral angiopathy without gangrene: Secondary | ICD-10-CM | POA: Diagnosis not present

## 2017-10-09 DIAGNOSIS — B351 Tinea unguium: Secondary | ICD-10-CM | POA: Diagnosis not present

## 2017-10-09 DIAGNOSIS — L84 Corns and callosities: Secondary | ICD-10-CM | POA: Diagnosis not present

## 2017-10-09 DIAGNOSIS — I739 Peripheral vascular disease, unspecified: Secondary | ICD-10-CM | POA: Diagnosis not present

## 2017-10-09 DIAGNOSIS — L0889 Other specified local infections of the skin and subcutaneous tissue: Secondary | ICD-10-CM | POA: Diagnosis not present

## 2017-10-15 ENCOUNTER — Telehealth: Payer: Self-pay | Admitting: Family Medicine

## 2017-10-15 NOTE — Telephone Encounter (Signed)
Patient left a voicemail requesting all of his medications to be sent to [facility] from now on. Cb# [phone]

## 2017-10-16 ENCOUNTER — Other Ambulatory Visit: Payer: Self-pay | Admitting: Family Medicine

## 2017-10-16 ENCOUNTER — Other Ambulatory Visit: Payer: Self-pay

## 2017-10-16 ENCOUNTER — Telehealth: Payer: Self-pay | Admitting: Family Medicine

## 2017-10-16 MED ORDER — METFORMIN HCL 500 MG PO TABS: 250.0000 mg | ORAL_TABLET | Freq: Every day | ORAL | 0 refills | Status: DC

## 2017-10-16 NOTE — Telephone Encounter (Signed)
I spoke to you about him. I called a 30 day supply of Metformin into a local pharmacy. Please advise.

## 2017-10-16 NOTE — Telephone Encounter (Signed)
Pt is going to Dr Karilyn CotaGosrani, however the appts are booked out, they advised the pt to call back to Dr Delton SeeNelson, for a 3 mth supply of medicine as that will be the wait time to get him in.  Send to [facility]

## 2017-10-16 NOTE — Telephone Encounter (Signed)
Spoke with patient and advised him Dr.Nelson would only authorize a 30 day supply of Metformin. He had received a letter in March letting him know she would be leaving the practice and in that letter there was a list with PCP's accepting new patients. He needed to call and make an appt. He verbalized understanding.

## 2017-10-21 ENCOUNTER — Other Ambulatory Visit: Payer: Self-pay | Admitting: Family Medicine

## 2017-11-10 ENCOUNTER — Other Ambulatory Visit: Payer: Self-pay | Admitting: Family Medicine

## 2017-11-18 DIAGNOSIS — Z760 Encounter for issue of repeat prescription: Secondary | ICD-10-CM | POA: Diagnosis not present

## 2017-12-25 DIAGNOSIS — E119 Type 2 diabetes mellitus without complications: Secondary | ICD-10-CM | POA: Diagnosis not present

## 2017-12-25 DIAGNOSIS — Z125 Encounter for screening for malignant neoplasm of prostate: Secondary | ICD-10-CM | POA: Diagnosis not present

## 2017-12-25 DIAGNOSIS — I10 Essential (primary) hypertension: Secondary | ICD-10-CM | POA: Diagnosis not present

## 2017-12-25 DIAGNOSIS — E559 Vitamin D deficiency, unspecified: Secondary | ICD-10-CM | POA: Diagnosis not present

## 2017-12-25 DIAGNOSIS — E785 Hyperlipidemia, unspecified: Secondary | ICD-10-CM | POA: Diagnosis not present

## 2017-12-25 DIAGNOSIS — E669 Obesity, unspecified: Secondary | ICD-10-CM | POA: Diagnosis not present

## 2018-01-10 DIAGNOSIS — H01119 Allergic dermatitis of unspecified eye, unspecified eyelid: Secondary | ICD-10-CM | POA: Diagnosis not present

## 2018-01-18 ENCOUNTER — Other Ambulatory Visit: Payer: Self-pay | Admitting: Family Medicine

## 2018-02-07 ENCOUNTER — Other Ambulatory Visit: Payer: Self-pay | Admitting: Family Medicine

## 2018-02-07 DIAGNOSIS — E119 Type 2 diabetes mellitus without complications: Secondary | ICD-10-CM

## 2018-02-25 ENCOUNTER — Other Ambulatory Visit: Payer: Self-pay | Admitting: Family Medicine

## 2018-02-25 DIAGNOSIS — E119 Type 2 diabetes mellitus without complications: Secondary | ICD-10-CM

## 2018-03-04 DIAGNOSIS — E559 Vitamin D deficiency, unspecified: Secondary | ICD-10-CM | POA: Diagnosis not present

## 2018-03-04 DIAGNOSIS — I10 Essential (primary) hypertension: Secondary | ICD-10-CM | POA: Diagnosis not present

## 2018-03-04 DIAGNOSIS — E119 Type 2 diabetes mellitus without complications: Secondary | ICD-10-CM | POA: Diagnosis not present

## 2018-03-24 DIAGNOSIS — Z23 Encounter for immunization: Secondary | ICD-10-CM | POA: Diagnosis not present

## 2018-03-25 DIAGNOSIS — I739 Peripheral vascular disease, unspecified: Secondary | ICD-10-CM | POA: Diagnosis not present

## 2018-03-25 DIAGNOSIS — E1151 Type 2 diabetes mellitus with diabetic peripheral angiopathy without gangrene: Secondary | ICD-10-CM | POA: Diagnosis not present

## 2018-03-25 DIAGNOSIS — L84 Corns and callosities: Secondary | ICD-10-CM | POA: Diagnosis not present

## 2018-03-25 DIAGNOSIS — L603 Nail dystrophy: Secondary | ICD-10-CM | POA: Diagnosis not present

## 2018-04-14 DIAGNOSIS — E669 Obesity, unspecified: Secondary | ICD-10-CM | POA: Diagnosis not present

## 2018-04-14 DIAGNOSIS — E119 Type 2 diabetes mellitus without complications: Secondary | ICD-10-CM | POA: Diagnosis not present

## 2018-04-14 DIAGNOSIS — E785 Hyperlipidemia, unspecified: Secondary | ICD-10-CM | POA: Diagnosis not present

## 2018-04-14 DIAGNOSIS — E559 Vitamin D deficiency, unspecified: Secondary | ICD-10-CM | POA: Diagnosis not present

## 2018-04-14 DIAGNOSIS — I10 Essential (primary) hypertension: Secondary | ICD-10-CM | POA: Diagnosis not present

## 2018-05-12 DIAGNOSIS — E119 Type 2 diabetes mellitus without complications: Secondary | ICD-10-CM | POA: Diagnosis not present

## 2018-05-12 DIAGNOSIS — G479 Sleep disorder, unspecified: Secondary | ICD-10-CM | POA: Diagnosis not present

## 2018-05-12 DIAGNOSIS — E669 Obesity, unspecified: Secondary | ICD-10-CM | POA: Diagnosis not present

## 2018-05-12 DIAGNOSIS — E559 Vitamin D deficiency, unspecified: Secondary | ICD-10-CM | POA: Diagnosis not present

## 2018-05-19 ENCOUNTER — Institutional Professional Consult (permissible substitution): Payer: Self-pay | Admitting: Neurology

## 2018-06-04 DIAGNOSIS — I10 Essential (primary) hypertension: Secondary | ICD-10-CM | POA: Diagnosis not present

## 2018-06-04 DIAGNOSIS — F331 Major depressive disorder, recurrent, moderate: Secondary | ICD-10-CM | POA: Diagnosis not present

## 2018-06-04 DIAGNOSIS — Z23 Encounter for immunization: Secondary | ICD-10-CM | POA: Diagnosis not present

## 2018-06-04 DIAGNOSIS — Z113 Encounter for screening for infections with a predominantly sexual mode of transmission: Secondary | ICD-10-CM | POA: Diagnosis not present

## 2018-06-04 DIAGNOSIS — E119 Type 2 diabetes mellitus without complications: Secondary | ICD-10-CM | POA: Diagnosis not present

## 2018-06-04 DIAGNOSIS — Z1211 Encounter for screening for malignant neoplasm of colon: Secondary | ICD-10-CM | POA: Diagnosis not present

## 2018-06-08 ENCOUNTER — Other Ambulatory Visit (HOSPITAL_COMMUNITY): Payer: Self-pay | Admitting: Nurse Practitioner

## 2018-06-08 DIAGNOSIS — Z87891 Personal history of nicotine dependence: Secondary | ICD-10-CM

## 2018-06-29 ENCOUNTER — Ambulatory Visit (HOSPITAL_COMMUNITY): Payer: Medicare HMO

## 2018-07-15 ENCOUNTER — Institutional Professional Consult (permissible substitution): Payer: Self-pay | Admitting: Neurology

## 2018-07-21 DIAGNOSIS — I10 Essential (primary) hypertension: Secondary | ICD-10-CM | POA: Diagnosis not present

## 2018-07-21 DIAGNOSIS — E669 Obesity, unspecified: Secondary | ICD-10-CM | POA: Diagnosis not present

## 2018-07-21 DIAGNOSIS — E785 Hyperlipidemia, unspecified: Secondary | ICD-10-CM | POA: Diagnosis not present

## 2018-07-21 DIAGNOSIS — E119 Type 2 diabetes mellitus without complications: Secondary | ICD-10-CM | POA: Diagnosis not present

## 2018-07-21 DIAGNOSIS — F331 Major depressive disorder, recurrent, moderate: Secondary | ICD-10-CM | POA: Diagnosis not present

## 2018-07-21 DIAGNOSIS — E559 Vitamin D deficiency, unspecified: Secondary | ICD-10-CM | POA: Diagnosis not present

## 2018-07-23 DIAGNOSIS — Z23 Encounter for immunization: Secondary | ICD-10-CM | POA: Diagnosis not present

## 2018-07-27 DIAGNOSIS — L989 Disorder of the skin and subcutaneous tissue, unspecified: Secondary | ICD-10-CM | POA: Diagnosis not present

## 2018-08-17 DIAGNOSIS — L700 Acne vulgaris: Secondary | ICD-10-CM | POA: Diagnosis not present

## 2018-08-17 DIAGNOSIS — L989 Disorder of the skin and subcutaneous tissue, unspecified: Secondary | ICD-10-CM | POA: Diagnosis not present

## 2018-08-24 DIAGNOSIS — E1151 Type 2 diabetes mellitus with diabetic peripheral angiopathy without gangrene: Secondary | ICD-10-CM | POA: Diagnosis not present

## 2018-08-24 DIAGNOSIS — I739 Peripheral vascular disease, unspecified: Secondary | ICD-10-CM | POA: Diagnosis not present

## 2018-08-24 DIAGNOSIS — L603 Nail dystrophy: Secondary | ICD-10-CM | POA: Diagnosis not present

## 2018-08-24 DIAGNOSIS — L84 Corns and callosities: Secondary | ICD-10-CM | POA: Diagnosis not present

## 2018-10-22 ENCOUNTER — Ambulatory Visit (INDEPENDENT_AMBULATORY_CARE_PROVIDER_SITE_OTHER): Payer: Medicare HMO | Admitting: Nurse Practitioner

## 2018-10-29 DIAGNOSIS — L989 Disorder of the skin and subcutaneous tissue, unspecified: Secondary | ICD-10-CM | POA: Diagnosis not present

## 2018-10-29 DIAGNOSIS — E785 Hyperlipidemia, unspecified: Secondary | ICD-10-CM | POA: Diagnosis not present

## 2018-10-29 DIAGNOSIS — E119 Type 2 diabetes mellitus without complications: Secondary | ICD-10-CM | POA: Diagnosis not present

## 2018-10-29 DIAGNOSIS — I10 Essential (primary) hypertension: Secondary | ICD-10-CM | POA: Diagnosis not present

## 2018-11-26 IMAGING — DX DG FOOT COMPLETE 3+V*R*
3 series · 3 of 3 positions shown · non-contrast
Comparison: None.

CLINICAL DATA: Pain and numbness.  Diabetes mellitus.

EXAM:
RIGHT FOOT COMPLETE - 3+ VIEW

[foot ap]
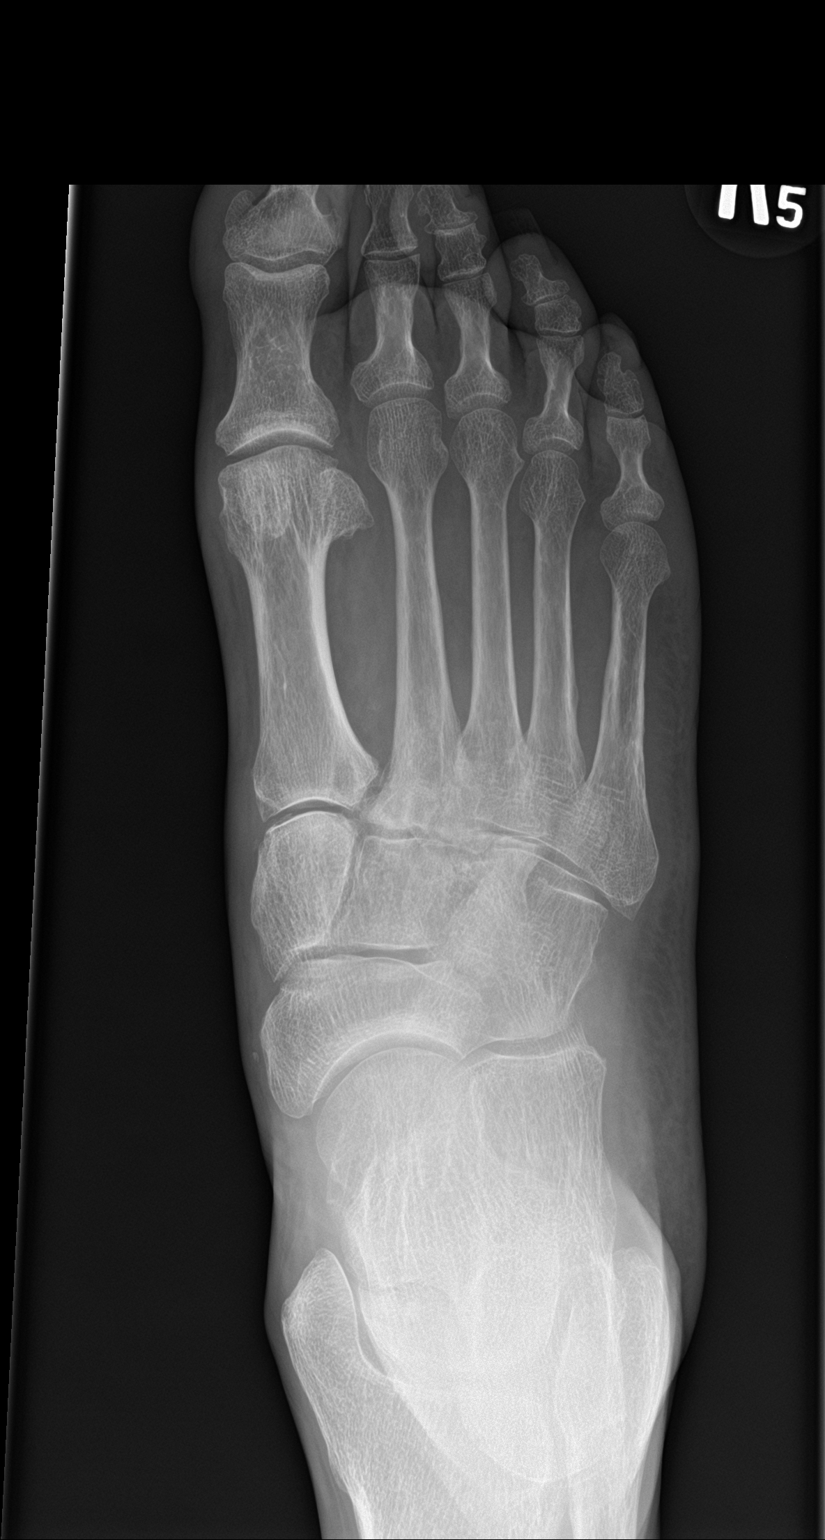

[foot obl]
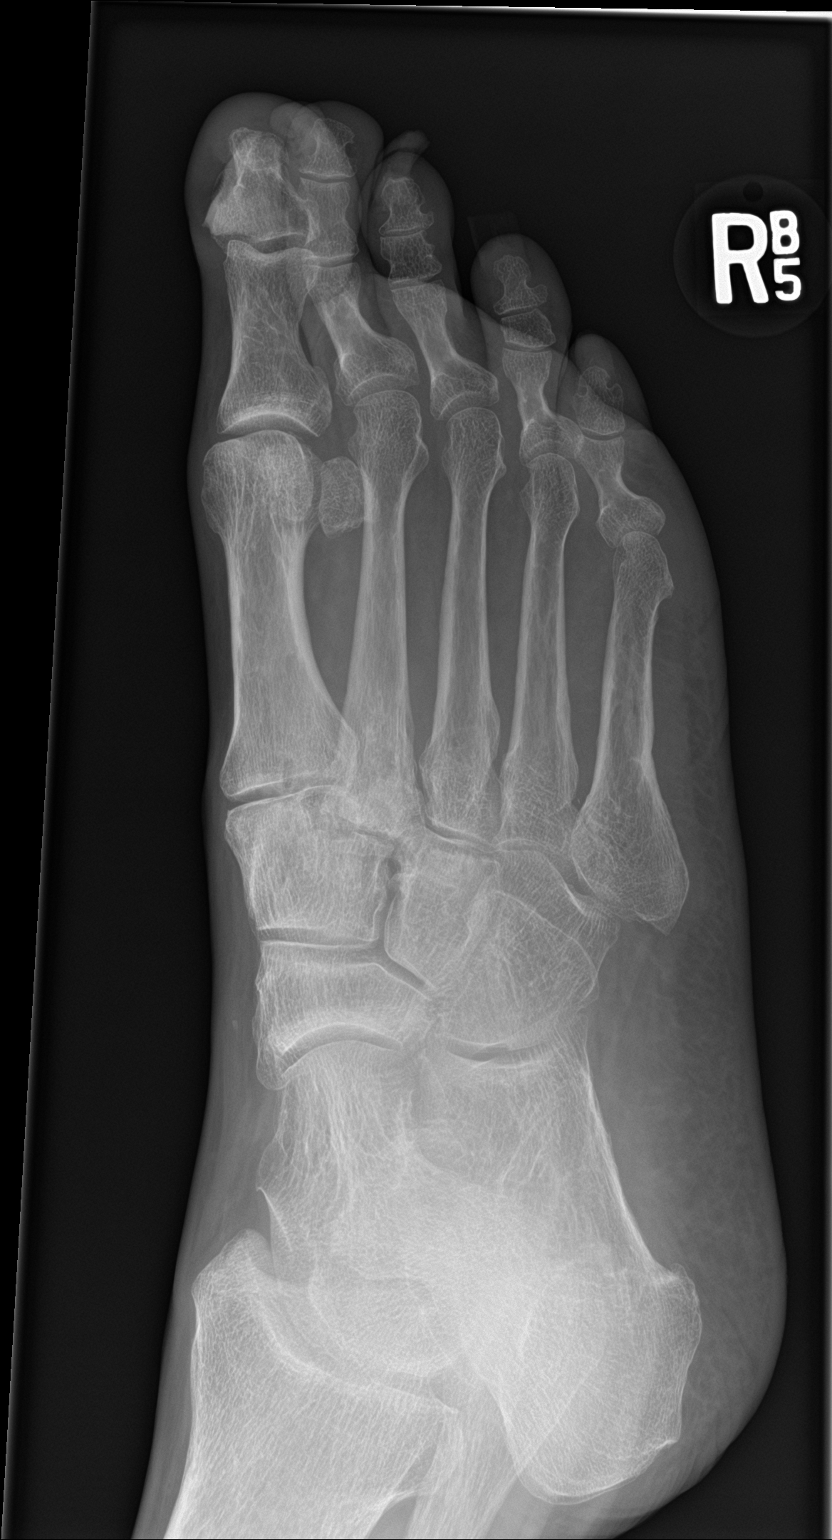

[foot lat]
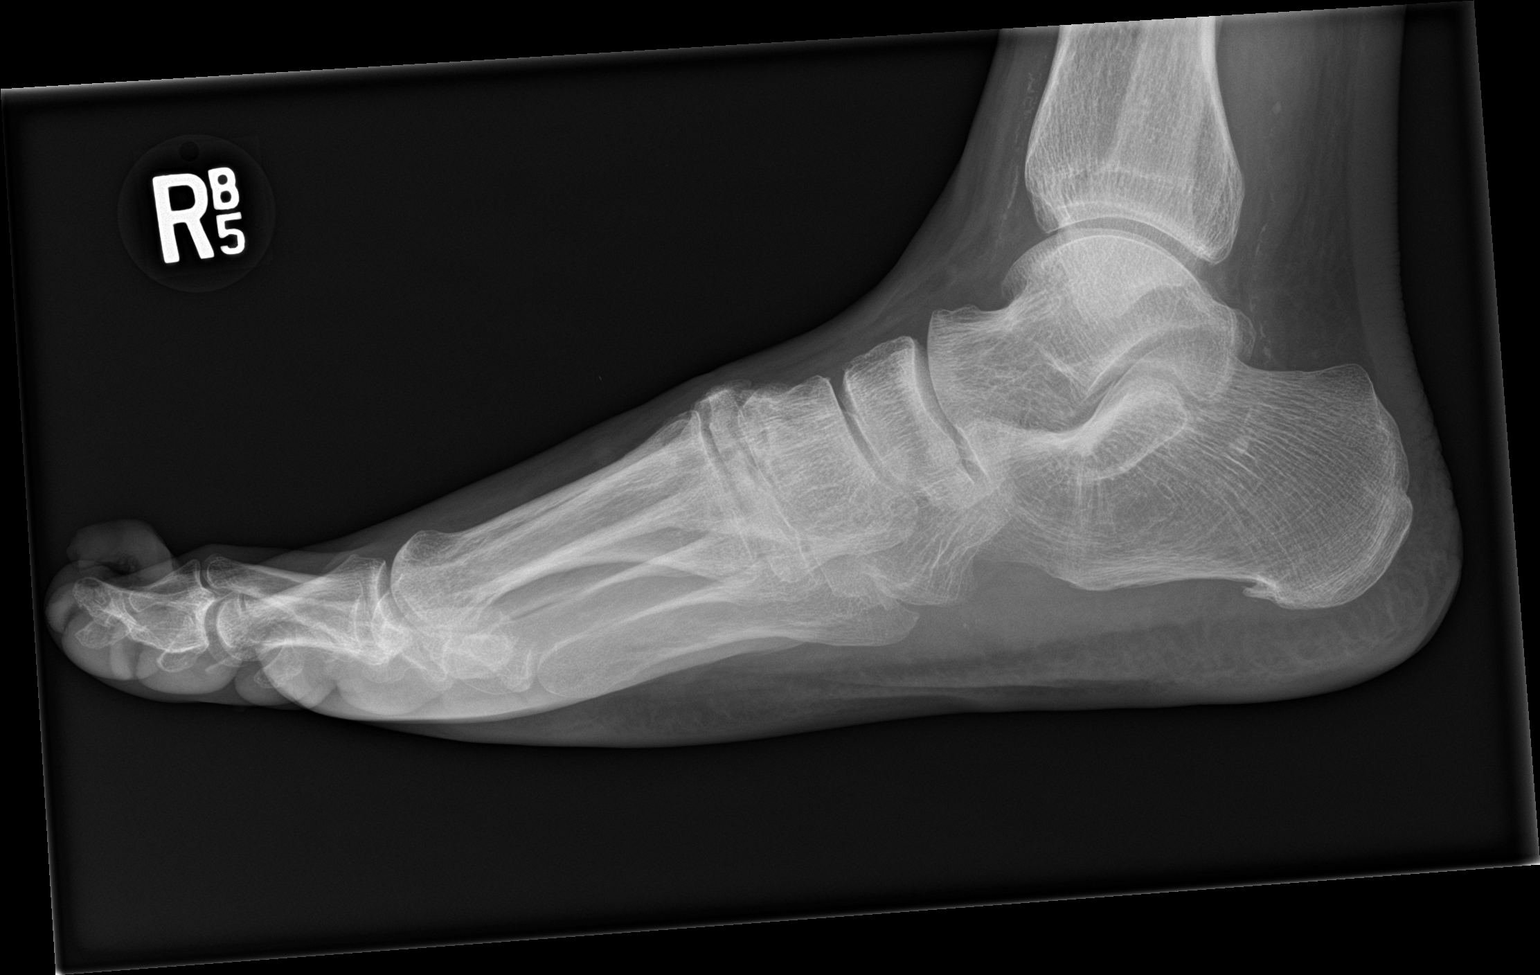

[3 of 3 positions shown; findings below may reference images not displayed]

FINDINGS: Frontal, oblique, and lateral views were obtained. No acute fracture
or dislocation. There is marked arthropathy at the medial cuneiform
-second metatarsal joint. There is bony remodeling along the
proximal second metatarsal. Other joint spaces appear unremarkable.
No erosive change. There is a small inferior calcaneal spur. There
are foci of arterial vascular calcification. There is pes spinous.
IMPRESSION: Marked osteoarthritic change in the medial cuneiform - second
metatarsal joint with remodeling of the proximal second metatarsal.
Question degree of neuropathic change in the midfoot region.

No acute fracture or dislocation. No bony destruction. Other joint
spaces appear unremarkable. Foci of arteriovascular calcification
are likely due to known diabetes mellitus.

There is pes planus.

## 2018-12-08 DIAGNOSIS — E559 Vitamin D deficiency, unspecified: Secondary | ICD-10-CM | POA: Diagnosis not present

## 2018-12-08 DIAGNOSIS — E119 Type 2 diabetes mellitus without complications: Secondary | ICD-10-CM | POA: Diagnosis not present

## 2018-12-08 DIAGNOSIS — F331 Major depressive disorder, recurrent, moderate: Secondary | ICD-10-CM | POA: Diagnosis not present

## 2018-12-08 DIAGNOSIS — I10 Essential (primary) hypertension: Secondary | ICD-10-CM | POA: Diagnosis not present

## 2019-03-05 ENCOUNTER — Other Ambulatory Visit: Payer: Self-pay

## 2019-03-05 DIAGNOSIS — Z20822 Contact with and (suspected) exposure to covid-19: Secondary | ICD-10-CM

## 2019-03-05 DIAGNOSIS — R6889 Other general symptoms and signs: Secondary | ICD-10-CM | POA: Diagnosis not present

## 2019-03-07 LAB — NOVEL CORONAVIRUS, NAA: SARS-CoV-2, NAA: NOT DETECTED

## 2019-03-15 ENCOUNTER — Other Ambulatory Visit (INDEPENDENT_AMBULATORY_CARE_PROVIDER_SITE_OTHER): Payer: Self-pay | Admitting: Internal Medicine

## 2019-03-15 ENCOUNTER — Other Ambulatory Visit: Payer: Self-pay

## 2019-03-15 ENCOUNTER — Encounter (INDEPENDENT_AMBULATORY_CARE_PROVIDER_SITE_OTHER): Payer: Self-pay | Admitting: Internal Medicine

## 2019-03-15 ENCOUNTER — Other Ambulatory Visit (INDEPENDENT_AMBULATORY_CARE_PROVIDER_SITE_OTHER): Payer: Self-pay

## 2019-03-15 ENCOUNTER — Ambulatory Visit (INDEPENDENT_AMBULATORY_CARE_PROVIDER_SITE_OTHER): Payer: Medicare HMO | Admitting: Internal Medicine

## 2019-03-15 VITALS — BP 120/70 | HR 60 | Ht 70.0 in | Wt 222.0 lb

## 2019-03-15 DIAGNOSIS — Z720 Tobacco use: Secondary | ICD-10-CM | POA: Diagnosis not present

## 2019-03-15 DIAGNOSIS — E782 Mixed hyperlipidemia: Secondary | ICD-10-CM

## 2019-03-15 DIAGNOSIS — E119 Type 2 diabetes mellitus without complications: Secondary | ICD-10-CM

## 2019-03-15 DIAGNOSIS — I10 Essential (primary) hypertension: Secondary | ICD-10-CM

## 2019-03-15 NOTE — Progress Notes (Signed)
Wellness Office Visit  Subjective:  Patient ID: Alex Marks, male    DOB: [birth year]  Age: 73 y.o. MRN: [id number]  CC: This man comes in for follow-up of his multiple medical problems including diabetes, hypertension, COPD, tobacco abuse and gastroesophageal reflux disease. HPI He seems quite fatigued and does not seem to have motivation to become healthier.  He continues to smoke cigarettes unfortunately.  His last hemoglobin A1c for his diabetes was around 8%.  He continues to take medications as before. He denies any chest pain, dyspnea, palpitations or limb weakness.  Past Medical History:  Diagnosis Date  . COPD (chronic obstructive pulmonary disease) ([place])   . Depression   . Frequency of urination   . GAD (generalized anxiety disorder)   . GERD (gastroesophageal reflux disease)   . Hepatitis C    "was treated; was cured after 3 month" (05/30/2017)  . Hypercholesterolemia   . Hypertension   . PAD (peripheral artery disease) ([place])   . Substance abuse ([place])   . Transfusion of blood product refused for religious reason   . Type II diabetes mellitus (HCC)       Family History  Problem Relation Age of Onset  . Alzheimer's disease Mother   . Mental illness Mother   . Cancer Father        bladder?  [name] Hypertension Father   . Alcohol abuse Father   . Heart disease Sister   . Hearing loss Sister        valve  . Alcohol abuse Brother   . Diabetes Paternal Aunt   . Alcohol abuse Brother   . Alcohol abuse Brother   . Alcohol abuse Brother   . Early death Brother        MVA  . Cancer Maternal Grandmother     Social History   Social History Narrative   Live in boarding house   Renting a room   looking for apartment   Never had license   Likes to read     Current Meds  Medication Sig  . ACCU-CHEK AVIVA PLUS test strip USE AS DIRECTED TO TEST TWICE DAILY AS NEEDED  . amLODipine (NORVASC) 10 MG tablet TAKE 1 TABLET BY MOUTH EVERY DAY  . aspirin 325 MG EC  tablet Take 325 mg by mouth daily. May take an additional 325 mgs as needed for sleep  . aspirin-sod bicarb-citric acid (ALKA-SELTZER) 325 MG TBEF tablet Take 325 mg by mouth daily as needed (indigestion).  [name] atorvastatin (LIPITOR) 10 MG tablet Take 1 tablet (10 mg total) by mouth daily.  . blood glucose meter kit and supplies KIT Dx   e11.9  . canagliflozin (INVOKANA) 100 MG TABS tablet Take 1 tablet (100 mg total) by mouth daily before breakfast.  . losartan (COZAAR) 100 MG tablet Take 1 tablet (100 mg total) by mouth daily.  . metFORMIN (GLUCOPHAGE) 500 MG tablet Take 0.5 tablets (250 mg total) by mouth daily with breakfast.  . Multiple Vitamin (MULTIVITAMIN WITH MINERALS) TABS tablet Take 1 tablet by mouth daily.  . Naphazoline-Glycerin (REDNESS RELIEF OP) Place 1 drop into both eyes daily as needed (redness).     Nutrition  He tends to have 3 meals a day but also seems to snack. Sleep  10 hours every night,interrupted  Exercise  40-50 PUSH [employer] EVERY NIGHT.Walks 15-20 minutes walking daily.   Objective:   Today's Vitals: BP 120/70   Pulse 60   Ht '5\' 10"'  (1.778 m)  Wt 222 lb (100.7 kg)   BMI 31.85 kg/m  Vitals with BMI 03/15/2019 05/31/2017 05/31/2017  Height '5\' 10"'  - -  Weight 222 lbs - -  BMI 93.10 - -  Systolic 914 560 278  Diastolic 70 66 70  Pulse 60 50 -     Physical Exam He is obese.  Blood pressure is reasonable.  He is alert and orientated without any focal neurological signs.      Assessment   1. Essential hypertension   2. Controlled type 2 diabetes mellitus without complication, without long-term current use of insulin ([place])   3. Tobacco abuse   4. Mixed hyperlipidemia      Plan: 1. He will continue with all medications for his chronic conditions above. 2. Bladder work is scheduled and taken as outlined below. 3. I encouraged him to try and quit smoking again because this is really important for his overall health.  He realizes he needs to but  I am not convinced he is going to make effort required to do so. 4. Further recommendations will depend on blood results and I will see him in 3 months time for follow-up.  Tests ordered Orders Placed This Encounter  Procedures  . CBC  . COMPLETE METABOLIC PANEL WITH GFR  . Hemoglobin A1c  . T3, free  . TSH      Luther Parody, MD

## 2019-03-15 NOTE — Patient Instructions (Addendum)
[facility] Dietary Recommendations for Weight Loss What to Avoid . Avoid added sugars o Often added sugar can be found in processed foods such as many condiments, dry cereals, cakes, cookies, chips, crisps, crackers, candies, sweetened drinks, etc.  o Read labels and AVOID/DECREASE use of foods with the following in their ingredient list: Sugar, fructose, high fructose corn syrup, sucrose, glucose, maltose, dextrose, molasses, cane sugar, brown sugar, any type of syrup, agave nectar, etc.   . Avoid snacking in between meals . Avoid foods made with flour o If you are going to eat food made with flour, choose those made with whole-grains; and, minimize your consumption as much as is tolerable . Avoid processed foods o These foods are generally stocked in the middle of the grocery store. Focus on shopping on the perimeter of the grocery.  What to Include . Vegetables o GREEN LEAFY VEGETABLES: Kale, spinach, mustard greens, collard greens, cabbage, broccoli, etc. o OTHER: Asparagus, cauliflower, eggplant, carrots, peas, Brussel sprouts, tomatoes, bell peppers, zucchini, beets, cucumbers, etc. . Grains, seeds, and legumes o Beans: kidney beans, black eyed peas, garbanzo beans, black beans, pinto beans, etc. o Whole, unrefined grains: brown rice, barley, bulgur, oatmeal, etc. . Healthy fats  o Avoid highly processed fats such as vegetable oil o Examples of healthy fats: avocado, olives, virgin olive oil, dark chocolate (?72% Cocoa), nuts (peanuts, almonds, walnuts, cashews, pecans, etc.) . Low - Moderate Intake of Animal Sources of Protein o Meat sources: chicken, turkey, salmon, tuna. Limit to 4 ounces of meat at one time. o Consider limiting dairy sources, but when choosing dairy focus on: PLAIN Greek yogurt, cottage cheese, high-protein milk . Fruit o Choose berries  When to Eat . Intermittent Fasting: o Choosing not to eat for a specific time period, but DO FOCUS ON HYDRATION  when fasting o Multiple Techniques: - Time Restricted Eating: eat 3 meals in a day, each meal lasting no more than 60 minutes, no snacks between meals - 16-18 hour fast: fast for 16 to 18 hours up to 7 days a week. Often suggested to start with 2-3 nonconsecutive days per week.  . Remember the time you sleep is counted as fasting.  . Examples of eating schedule: Fast from 7:00pm-11:00am. Eat between 11:00am-7:00pm.  - 24-hour fast: fast for 24 hours up to every other day. Often suggested to start with 1 day per week . Remember the time you sleep is counted as fasting . Examples of eating schedule:  o Eating day: eat 2-3 meals on your eating day. If doing 2 meals, each meal should last no more than 90 minutes. If doing 3 meals, each meal should last no more than 60 minutes. Finish last meal by 7:00pm. o Fasting day: Fast until 7:00pm.  o IF YOU FEEL UNWELL FOR ANY REASON/IN ANY WAY WHEN FASTING, STOP FASTING BY EATING A NUTRITIOUS SNACK OR LIGHT MEAL o ALWAYS FOCUS ON HYDRATION DURING FASTS - Acceptable Hydration sources: water, broths, tea/coffee (black tea/coffee is best but using a small amount of whole-fat dairy products in coffee/tea is acceptable).  - Poor Hydration Sources: anything with sugar or artificial sweeteners added to it  These recommendations have been developed for patients that are actively receiving medical care from either Dr. Logon Uttech or Sarah Gray, DNP, NP-C at [facility]. These recommendations are developed for patients with specific medical conditions and are not meant to be distributed or used by others that are not actively receiving care from either provider listed   above at [facility]. It is not appropriate to participate in the above eating plans without proper medical supervision.   Reference: Rexanne Mano. The obesity code. [place]; 2016.  TRY TO QUIT SMOKING.

## 2019-03-16 ENCOUNTER — Other Ambulatory Visit: Payer: Self-pay

## 2019-03-16 ENCOUNTER — Other Ambulatory Visit (INDEPENDENT_AMBULATORY_CARE_PROVIDER_SITE_OTHER): Payer: Self-pay | Admitting: Internal Medicine

## 2019-03-16 DIAGNOSIS — Z20822 Contact with and (suspected) exposure to covid-19: Secondary | ICD-10-CM

## 2019-03-16 DIAGNOSIS — R6889 Other general symptoms and signs: Secondary | ICD-10-CM | POA: Diagnosis not present

## 2019-03-16 LAB — COMPLETE METABOLIC PANEL WITH GFR
AG Ratio: 1.4 (calc) (ref 1.0–2.5)
ALT: 18 U/L (ref 9–46)
AST: 16 U/L (ref 10–35)
Albumin: 4.2 g/dL (ref 3.6–5.1)
Alkaline phosphatase (APISO): 117 U/L (ref 35–144)
BUN: 12 mg/dL (ref 7–25)
CO2: 23 mmol/L (ref 20–32)
Calcium: 9.4 mg/dL (ref 8.6–10.3)
Chloride: 102 mmol/L (ref 98–110)
Creat: 1.02 mg/dL (ref 0.70–1.18)
GFR, Est African American: 85 mL/min/{1.73_m2} (ref >=60)
GFR, Est Non African American: 73 mL/min/{1.73_m2} (ref >=60)
Globulin: 3.1 g/dL (calc) (ref 1.9–3.7)
Glucose, Bld: 330 mg/dL — ABNORMAL HIGH (ref 65–99)
Potassium: 4.4 mmol/L (ref 3.5–5.3)
Sodium: 138 mmol/L (ref 135–146)
Total Bilirubin: 0.7 mg/dL (ref 0.2–1.2)
Total Protein: 7.3 g/dL (ref 6.1–8.1)

## 2019-03-16 LAB — CBC
HCT: 51.9 % — ABNORMAL HIGH (ref 38.5–50.0)
Hemoglobin: 17.6 g/dL — ABNORMAL HIGH (ref 13.2–17.1)
MCH: 29.7 pg (ref 27.0–33.0)
MCHC: 33.9 g/dL (ref 32.0–36.0)
MCV: 87.7 fL (ref 80.0–100.0)
MPV: 12 fL (ref 7.5–12.5)
Platelets: 211 10*3/uL (ref 140–400)
RBC: 5.92 10*6/uL — ABNORMAL HIGH (ref 4.20–5.80)
RDW: 12.9 % (ref 11.0–15.0)
WBC: 11.2 10*3/uL — ABNORMAL HIGH (ref 3.8–10.8)

## 2019-03-16 LAB — HEMOGLOBIN A1C
Hgb A1c MFr Bld: 9.9 % of total Hgb — ABNORMAL HIGH (ref <5.7)
Mean Plasma Glucose: 237 (calc)
eAG (mmol/L): 13.2 (calc)

## 2019-03-16 LAB — TSH: TSH: 2.79 mIU/L (ref 0.40–4.50)

## 2019-03-16 LAB — T3, FREE: T3, Free: 3.8 pg/mL (ref 2.3–4.2)

## 2019-03-16 MED ORDER — METFORMIN HCL 500 MG PO TABS: 1000.0000 mg | ORAL_TABLET | Freq: Two times a day (BID) | ORAL | 3 refills | Status: DC

## 2019-03-16 NOTE — Progress Notes (Signed)
I spoke with the patient regarding his extremely uncontrolled diabetes with a hemoglobin A1c of 9.9%.  He will increase metformin to 1000 mg twice a day, as long as he tolerates it and I have sent a new prescription to reflect this change.  He will continue with [employer].  We also discussed his diet and I strongly recommended that he  eliminate drinking Coke from his diet.

## 2019-03-17 ENCOUNTER — Telehealth (INDEPENDENT_AMBULATORY_CARE_PROVIDER_SITE_OTHER): Payer: Self-pay

## 2019-03-17 NOTE — Telephone Encounter (Signed)
Pharmacy, [facility] called to see if the Metformin 500 mg dosage was correct. They notice from last order the increase.

## 2019-03-17 NOTE — Telephone Encounter (Signed)
Called pharmacy, to give verbal read back of order stating this is correct dose for Metformin 500 mg.

## 2019-03-17 NOTE — Telephone Encounter (Signed)
Yes ,that is correct.His Diabetes is terrible!

## 2019-03-17 NOTE — Telephone Encounter (Signed)
Return call to [facility]. No answer, left voice mail to Pharmacy that the order was correct

## 2019-03-18 LAB — NOVEL CORONAVIRUS, NAA: SARS-CoV-2, NAA: NOT DETECTED

## 2019-04-05 ENCOUNTER — Telehealth (INDEPENDENT_AMBULATORY_CARE_PROVIDER_SITE_OTHER): Payer: Self-pay

## 2019-04-05 NOTE — Telephone Encounter (Signed)
I do not believe I saw this.  He will need to make an appointment so I can examine before we make any further recommendations.

## 2019-04-08 ENCOUNTER — Telehealth (INDEPENDENT_AMBULATORY_CARE_PROVIDER_SITE_OTHER): Payer: Self-pay

## 2019-04-08 NOTE — Telephone Encounter (Signed)
Got fax today.

## 2019-04-12 ENCOUNTER — Other Ambulatory Visit: Payer: Self-pay

## 2019-04-12 ENCOUNTER — Encounter (INDEPENDENT_AMBULATORY_CARE_PROVIDER_SITE_OTHER): Payer: Self-pay | Admitting: Internal Medicine

## 2019-04-12 ENCOUNTER — Ambulatory Visit (INDEPENDENT_AMBULATORY_CARE_PROVIDER_SITE_OTHER): Payer: Medicare HMO | Admitting: Internal Medicine

## 2019-04-12 DIAGNOSIS — L989 Disorder of the skin and subcutaneous tissue, unspecified: Secondary | ICD-10-CM | POA: Insufficient documentation

## 2019-04-12 HISTORY — DX: Disorder of the skin and subcutaneous tissue, unspecified: L98.9

## 2019-04-12 NOTE — Progress Notes (Signed)
Wellness Office Visit  Subjective:  Patient ID: Alex Marks, male    DOB: Jun 29, 1946  Age: 73 y.o. MRN: [id number]  CC: This man comes in for an acute visit with a chief complaint of scalp lesion. HPI  He noticed this about a week or 2 ago and appeared to be more swollen with, from his description, something that was fluid-filled.  However, this is improved, the fluid has dissipated but he still left with a lesion. Past Medical History:  Diagnosis Date  . COPD (chronic obstructive pulmonary disease) ([place])   . Depression   . Frequency of urination   . GAD (generalized anxiety disorder)   . GERD (gastroesophageal reflux disease)   . Hepatitis C    "was treated; was cured after 3 month" (05/30/2017)  . Hypercholesterolemia   . Hypertension   . PAD (peripheral artery disease) ([place])   . Scalp lesion 04/12/2019  . Substance abuse ([place])   . Transfusion of blood product refused for religious reason   . Type II diabetes mellitus (HCC)       Family History  Problem Relation Age of Onset  . Alzheimer's disease Mother   . Mental illness Mother   . Cancer Father        bladder?  [name] Hypertension Father   . Alcohol abuse Father   . Heart disease Sister   . Hearing loss Sister        valve  . Alcohol abuse Brother   . Diabetes Paternal Aunt   . Alcohol abuse Brother   . Alcohol abuse Brother   . Alcohol abuse Brother   . Early death Brother        MVA  . Cancer Maternal Grandmother     Social History   Social History Narrative   Live in boarding house   Renting a room   looking for apartment   Never had license   Likes to read     Current Meds  Medication Sig  . ACCU-CHEK AVIVA PLUS test strip USE AS DIRECTED TO TEST TWICE DAILY AS NEEDED  . amLODipine (NORVASC) 10 MG tablet TAKE 1 TABLET BY MOUTH ONCE DAILY  . aspirin 325 MG EC tablet Take 325 mg by mouth daily. May take an additional 325 mgs as needed for sleep  . aspirin-sod bicarb-citric acid  (ALKA-SELTZER) 325 MG TBEF tablet Take 325 mg by mouth daily as needed (indigestion).  [name] atorvastatin (LIPITOR) 10 MG tablet TAKE 1 TABLET BY MOUTH ONCE DAILY  . blood glucose meter kit and supplies KIT Dx   e11.9  . hydrocortisone cream 1 % Apply 1 application topically daily as needed for itching.  . INVOKANA 100 MG TABS tablet TAKE 1 TABLET BY MOUTH ONCE DAILY BEFORE BREAKFAST  . losartan (COZAAR) 100 MG tablet TAKE 1 TABLET BY MOUTH ONCE DAILY  . Menthol, Topical Analgesic, (ICY HOT EX) Apply 1 application topically daily as needed (muscle pain).  . metFORMIN (GLUCOPHAGE) 500 MG tablet Take 2 tablets (1,000 mg total) by mouth 2 (two) times daily.  . Multiple Vitamin (MULTIVITAMIN WITH MINERALS) TABS tablet Take 1 tablet by mouth daily.  . Naphazoline-Glycerin (REDNESS RELIEF OP) Place 1 drop into both eyes daily as needed (redness).  . [DISCONTINUED] oxyCODONE (OXY IR/ROXICODONE) 5 MG immediate release tablet Take 1-2 tablets (5-10 mg total) by mouth at bedtime as needed for moderate pain.       Objective:   Today's Vitals: BP 122/68   Pulse 72  Ht '5\' 10"'  (1.778 m)   Wt 220 lb 6.4 oz (100 kg)   BMI 31.62 kg/m  Vitals with BMI 04/12/2019 03/15/2019 05/31/2017  Height '5\' 10"'  '5\' 10"'  -  Weight 220 lbs 6 oz 222 lbs -  BMI 77.93 90.30 -  Systolic 092 330 076  Diastolic 68 70 66  Pulse 72 60 50     Physical Exam   On physical exam of his scalp, I do see actinic keratosis and several areas but the area in question, there is no longer any kind of abscess.  The actinic keratosis in this area is approximately 1 cm x 0.5 cm in dimensions.    Assessment   1. Scalp lesion       Tests ordered Orders Placed This Encounter  Procedures  . Ambulatory referral to Dermatology     Plan: 1. I will refer him to dermatologist, Dr. Nevada Crane, in [place] for further evaluation as he appears to have other actinic keratosis on the scalp also.  This may be amenable to local treatment. 2.  He was also given influenza vaccination today.   No orders of the defined types were placed in this encounter.   Doree Albee, MD

## 2019-04-20 ENCOUNTER — Other Ambulatory Visit: Payer: Self-pay

## 2019-04-20 DIAGNOSIS — Z20822 Contact with and (suspected) exposure to covid-19: Secondary | ICD-10-CM

## 2019-04-21 LAB — NOVEL CORONAVIRUS, NAA: SARS-CoV-2, NAA: NOT DETECTED

## 2019-04-23 ENCOUNTER — Telehealth (INDEPENDENT_AMBULATORY_CARE_PROVIDER_SITE_OTHER): Payer: Self-pay | Admitting: Internal Medicine

## 2019-04-23 NOTE — Telephone Encounter (Signed)
Negative COVID results given. Patient results "NOT Detected." Caller expressed understanding. ° °

## 2019-05-03 DIAGNOSIS — L570 Actinic keratosis: Secondary | ICD-10-CM | POA: Diagnosis not present

## 2019-05-03 DIAGNOSIS — X32XXXA Exposure to sunlight, initial encounter: Secondary | ICD-10-CM | POA: Diagnosis not present

## 2019-05-03 DIAGNOSIS — L820 Inflamed seborrheic keratosis: Secondary | ICD-10-CM | POA: Diagnosis not present

## 2019-05-11 ENCOUNTER — Other Ambulatory Visit: Payer: Self-pay | Admitting: *Deleted

## 2019-05-11 DIAGNOSIS — Z20822 Contact with and (suspected) exposure to covid-19: Secondary | ICD-10-CM

## 2019-05-11 DIAGNOSIS — Z20828 Contact with and (suspected) exposure to other viral communicable diseases: Secondary | ICD-10-CM | POA: Diagnosis not present

## 2019-05-14 LAB — NOVEL CORONAVIRUS, NAA: SARS-CoV-2, NAA: NOT DETECTED

## 2019-05-17 ENCOUNTER — Other Ambulatory Visit: Payer: Self-pay

## 2019-05-17 ENCOUNTER — Encounter (INDEPENDENT_AMBULATORY_CARE_PROVIDER_SITE_OTHER): Payer: Self-pay | Admitting: Internal Medicine

## 2019-05-17 ENCOUNTER — Ambulatory Visit (INDEPENDENT_AMBULATORY_CARE_PROVIDER_SITE_OTHER): Payer: Medicare HMO | Admitting: Internal Medicine

## 2019-05-17 VITALS — BP 160/90 | HR 72 | Ht 70.0 in | Wt 221.4 lb

## 2019-05-17 DIAGNOSIS — R361 Hematospermia: Secondary | ICD-10-CM | POA: Diagnosis not present

## 2019-05-17 NOTE — Progress Notes (Signed)
Metrics: Intervention Frequency ACO  Documented Smoking Status Yearly  Screened one or more times in 24 months  Cessation Counseling or  Active cessation medication Past 24 months  Past 24 months   Guideline developer: UpToDate (See UpToDate for funding source) Date Released: 2014       Wellness Office Visit  Subjective:  Patient ID: Alex Marks, male    DOB: Aug 14, 1945  Age: 73 y.o. MRN: [id number]  CC: Hematospermia HPI This man comes in for an acute visit with chief complaint of hematospermia.  He told me that a few days ago, he noticed blood-tinged sperm on ejaculation.  It was painless.  He said this is occurred twice now.  He denies any dysuria or any scrotal discomfort.  Past Medical History:  Diagnosis Date  . COPD (chronic obstructive pulmonary disease) ([place])   . Depression   . Frequency of urination   . GAD (generalized anxiety disorder)   . GERD (gastroesophageal reflux disease)   . Hepatitis C    "was treated; was cured after 3 month" (05/30/2017)  . Hypercholesterolemia   . Hypertension   . PAD (peripheral artery disease) ([place])   . Scalp lesion 04/12/2019  . Substance abuse ([place])   . Transfusion of blood product refused for religious reason   . Type II diabetes mellitus ([facility])       Family History  Problem Relation Age of Onset  . Alzheimer's disease Mother   . Mental illness Mother   . Cancer Father        bladder?  [name] Hypertension Father   . Alcohol abuse Father   . Heart disease Sister   . Hearing loss Sister        valve  . Alcohol abuse Brother   . Diabetes Paternal Aunt   . Alcohol abuse Brother   . Alcohol abuse Brother   . Alcohol abuse Brother   . Early death Brother        MVA  . Cancer Maternal Grandmother     Social History   Social History Narrative   Single.Live in boarding house   Renting a room   looking for apartment   Never had license   Likes to read   Social History   Tobacco Use  . Smoking status: Current Every  Day Smoker    Packs/day: 1.00    Years: 55.00    Pack years: 55.00    Types: Cigarettes    Start date: 07/01/1961  . Smokeless tobacco: Never Used  Substance Use Topics  . Alcohol use: No    Comment: 05/30/2017 "nothing since ~ 2012"    Current Meds  Medication Sig  . ACCU-CHEK AVIVA PLUS test strip USE AS DIRECTED TO TEST TWICE DAILY AS NEEDED  . amLODipine (NORVASC) 10 MG tablet TAKE 1 TABLET BY MOUTH ONCE DAILY  . aspirin 325 MG EC tablet Take 325 mg by mouth daily. May take an additional 325 mgs as needed for sleep  . aspirin-sod bicarb-citric acid (ALKA-SELTZER) 325 MG TBEF tablet Take 325 mg by mouth daily as needed (indigestion).  [name] atorvastatin (LIPITOR) 10 MG tablet TAKE 1 TABLET BY MOUTH ONCE DAILY  . blood glucose meter kit and supplies KIT Dx   e11.9  . hydrocortisone cream 1 % Apply 1 application topically daily as needed for itching.  . INVOKANA 100 MG TABS tablet TAKE 1 TABLET BY MOUTH ONCE DAILY BEFORE BREAKFAST  . losartan (COZAAR) 100 MG tablet TAKE 1 TABLET BY MOUTH ONCE  DAILY  . Menthol, Topical Analgesic, (ICY HOT EX) Apply 1 application topically daily as needed (muscle pain).  . metFORMIN (GLUCOPHAGE) 500 MG tablet Take 2 tablets (1,000 mg total) by mouth 2 (two) times daily.  . Multiple Vitamin (MULTIVITAMIN WITH MINERALS) TABS tablet Take 1 tablet by mouth daily.  . Naphazoline-Glycerin (REDNESS RELIEF OP) Place 1 drop into both eyes daily as needed (redness).      Objective:   Today's Vitals: BP (!) 160/90   Pulse 72   Ht 5' 10" (1.778 m)   Wt 221 lb 6.4 oz (100.4 kg)   BMI 31.77 kg/m  Vitals with BMI 05/17/2019 04/12/2019 03/15/2019  Height 5' 10" 5' 10" 5' 10"  Weight 221 lbs 6 oz 220 lbs 6 oz 222 lbs  BMI 31.77 81.19 14.78  Systolic 295 621 308  Diastolic 90 68 70  Pulse 72 72 60     Physical Exam   He looks systemically well, I did not examine his scrotum today or his penis.    Assessment   1. Hematospermia       Tests ordered  Orders Placed This Encounter  Procedures  . Ambulatory referral to Urology     Plan: 1. I will refer him to urology for further evaluation. 2. Tdap and Prevnar 13 vaccination was given today as he was overdue.   No orders of the defined types were placed in this encounter.   Doree Albee, MD

## 2019-06-01 ENCOUNTER — Other Ambulatory Visit: Payer: Self-pay

## 2019-06-01 DIAGNOSIS — Z20822 Contact with and (suspected) exposure to covid-19: Secondary | ICD-10-CM

## 2019-06-02 LAB — NOVEL CORONAVIRUS, NAA: SARS-CoV-2, NAA: NOT DETECTED

## 2019-06-07 ENCOUNTER — Telehealth (INDEPENDENT_AMBULATORY_CARE_PROVIDER_SITE_OTHER): Payer: Medicare HMO | Admitting: Nurse Practitioner

## 2019-06-07 ENCOUNTER — Encounter (INDEPENDENT_AMBULATORY_CARE_PROVIDER_SITE_OTHER): Payer: Self-pay | Admitting: Nurse Practitioner

## 2019-06-07 DIAGNOSIS — K921 Melena: Secondary | ICD-10-CM

## 2019-06-07 DIAGNOSIS — E119 Type 2 diabetes mellitus without complications: Secondary | ICD-10-CM | POA: Diagnosis not present

## 2019-06-07 DIAGNOSIS — Z0000 Encounter for general adult medical examination without abnormal findings: Secondary | ICD-10-CM

## 2019-06-07 MED ORDER — METFORMIN HCL ER (MOD) 1000 MG PO TB24: 1000.0000 mg | ORAL_TABLET | Freq: Every day | ORAL | 1 refills | Status: DC

## 2019-06-07 NOTE — Progress Notes (Signed)
Subjective:  Patient ID: Alex Marks, male    DOB: 1946/02/04  Age: 73 y.o. MRN: [id number]  Due to national recommendations of social distancing related to the [place] pandemic, an audio/visual tele-health visit was felt to be the most appropriate encounter type for this patient today. I connected with  Alex Marks on 06/07/19 utilizing audio-only technology and verified that I am speaking with the correct person using two identifiers. The patient was located at their home, and I was located at the office of [facility] during the encounter. I discussed the limitations of evaluation and management by telemedicine. The patient expressed understanding and agreed to proceed.  Audio only technology was utilized as the patient tells me that his video would not work on his telephone.  CC: Alex Marks presents for a subsequent annual wellness visit. Alex Marks rates their health as good.     HPI Patient tells me he generally is feeling well.  He still does have depression that can be bothersome at times, but feels that his mood is fairly stable.  Patient did mention that he is experiencing diarrhea with current Metformin dose.  Otherwise patient does not have any acute complaints today.    Past Medical History:  Diagnosis Date  . COPD (chronic obstructive pulmonary disease) ([place])   . Depression   . Frequency of urination   . GAD (generalized anxiety disorder)   . GERD (gastroesophageal reflux disease)   . Hepatitis C    "was treated; was cured after 3 month" (05/30/2017)  . Hypercholesterolemia   . Hypertension   . PAD (peripheral artery disease) ([place])   . Scalp lesion 04/12/2019  . Substance abuse ([place])   . Transfusion of blood product refused for religious reason   . Type II diabetes mellitus (HCC)      Family History  Problem Relation Age of Onset  . Alzheimer's disease Mother   . Mental illness Mother   . Cancer Father        bladder?  [name] Hypertension  Father   . Alcohol abuse Father   . Heart disease Sister   . Hearing loss Sister        valve  . Alcohol abuse Brother   . Diabetes Paternal Aunt   . Alcohol abuse Brother   . Alcohol abuse Brother   . Alcohol abuse Brother   . Early death Brother        MVA  . Cancer Maternal Grandmother     Social History   Social History Narrative   Single.Live in boarding house   Renting a room   looking for apartment   Never had license   Likes to read   Social History   Tobacco Use  . Smoking status: Current Every Day Smoker    Packs/day: 1.00    Years: 55.00    Pack years: 55.00    Types: Cigarettes    Start date: 07/01/1961  . Smokeless tobacco: Never Used  Substance Use Topics  . Alcohol use: No    Comment: 05/30/2017 "nothing since ~ 2012"     No outpatient medications have been marked as taking for the 06/07/19 encounter (Appointment) with Ailene Ards, NP.     Health Maintenance  Topic Date Due  . COLONOSCOPY  01/19/1997  . OPHTHALMOLOGY EXAM  12/24/2017  . FOOT EXAM  07/11/2018  . HEMOGLOBIN A1C  09/12/2019  . TETANUS/TDAP  05/16/2029  . INFLUENZA VACCINE  Completed  .  Hepatitis C Screening  Completed  . PNA vac Low Risk Adult  Completed     Most Recent Screenings/Health maintenance: 1. Colon Cancer Screening: Most likely overdue. 2. Sexually Transmitted Infection Screening: Completed 3. Diabetic Screening: Not applicable patient is diabetic 4. Hepatitis C Screening: Consider completing at next office visit 5. Lung Cancer Screening: Completed in 2018 and was negative, Due: Now 6. Abdominal Aortic Aneurysm Screening: Consider ordering this at next office visit 7. Smoking Cessation: Patient is trying to quit currently.  He is about to get nicotine patches Safety Screening Do you always wear a seatbelt?: yes Sleep How many hours of sleep do you usually get each night? 10 hours  Do you snore or has anyone told you that you snore?: no In the past 7 days, how  often have you felt sleepy during the daytime?: none Fall Risk  05/14/2017 02/11/2017 10/09/2016  Falls in the past year? No No No    Patient tells me he has had about 2 falls out of the bed over the last year.  He denies any his head.  We did discuss ways to prevent falls such as removing throw rugs from the home, using railings on stairs, using walkers or other assistive devices as prescribed, using proper lighting at night, using grab bars in the bathroom.  Also discussed proceeding to the emergency department for evaluation of brain bleed if he were to fall and hit his head.  He denies hitting his head with any falls over the last year.  Diet: Typical diet includes: Bologna, spiced ham, sandwiches, eggs, oatmeal, bacon, beanie weenies, chicken Exercise: Frequency and Duration: walks daily and push-ups daily.   Advanced Care Planning Do you have an advanced care plan in place? no, Type: not applicable  Do you want to make changes to your advanced care plan today?: no he would like to come in to discuss ACP in more detail   List of Current Medical Providers that Alex PhillipsNorbert L Marks Regularly Follows-up With: PCP with Dr. Karilyn [facility], podiatry, optometry  Immunization History  Administered Date(s) Administered  . DTaP 09/01/2015  . Hep A / Hep B 02/21/2016, 04/25/2016  . Influenza Inj Mdck Quad Pf 04/12/2019  . Influenza-Unspecified 02/21/2016, 04/14/2017  . Pneumococcal Conjugate-13 05/17/2019  . Pneumococcal Polysaccharide-23 08/23/2015  . Tdap 05/17/2019  .   ROS: Patient does mention he does notice some black stools.  He has not completed colon cancer screening as well as recommended previously.  He is agreeable to coming in to have a rectal exam as well as to test his stool in the office for occult blood at next office visit.  Review of systems otherwise negative.   Objective:   Today's Vitals: There were no vitals taken for this visit.  Vital signs not taken today as patient tells me he  is not sure how to use his blood pressure cuff at home.  He has a thermometer but could not get it to work today. PHQ9 SCORE ONLY 05/14/2017 02/11/2017 10/09/2016  Score 0 0 0  PHQ-9: 7/27; 0 for item 9  Screening Tools Scores:   6CIT: 4/28 [place] IADL: 8/8 Katz Index of Independence in Activities of Daily Living: 6/6  Vitals with BMI 05/17/2019 04/12/2019 03/15/2019  Height 5\' 10"  5\' 10"  5\' 10"   Weight 221 lbs 6 oz 220 lbs 6 oz 222 lbs  BMI 31.77 31.62 31.85  Systolic 160 122 [id number]  Diastolic 90 68 70  Pulse 72 72 60  Height and weight  not collected today as office visit was conducted remotely. TUG: Not conducted today as office visit was conducted remotely   Physical Exam  Comprehensive physical exam not completed today as office visit was done remotely.  Patient did sound well over the phone.  He was alert and oriented, and answers questions appropriately.     Assessment   No diagnosis found.    Tests ordered No orders of the defined types were placed in this encounter.    Plan: I have changed his immediate release Metformin to extended release and reduced his dose from 2000 to 1000 mg daily to see if this will improve his diarrhea.  He will be due for annual Medicare wellness visit in 1 year.  He is still due for colon cancer screening, lung cancer screening follow-up, hepatitis C screening, and abdominal aortic aneurysm screening.  We will need to discuss this in further detail at next office visit.  He also would like to fill out a MOST form as part of his advanced care planning.  We will discuss this face-to-face at his next office visit.  I did go over the most form sections over the phone today, but will await face-to-face visit to fill out form.  He did mention that he sometimes has black stools.  We will need to check for occult bleeding at next office visit.  No orders of the defined types were placed in this encounter.   Patient to follow-up in 1 month.   We will also be due for annual Medicare wellness visit in approximately 1 year.  Patient was encouraged to call the office with any questions or concerns prior to their next appointment.  Today's telephone visit lasted for 1 hour and 2 minutes.  Elenore Paddy, NP

## 2019-06-17 DIAGNOSIS — L84 Corns and callosities: Secondary | ICD-10-CM | POA: Diagnosis not present

## 2019-06-17 DIAGNOSIS — E1151 Type 2 diabetes mellitus with diabetic peripheral angiopathy without gangrene: Secondary | ICD-10-CM | POA: Diagnosis not present

## 2019-06-17 DIAGNOSIS — L603 Nail dystrophy: Secondary | ICD-10-CM | POA: Diagnosis not present

## 2019-06-17 DIAGNOSIS — I739 Peripheral vascular disease, unspecified: Secondary | ICD-10-CM | POA: Diagnosis not present

## 2019-06-21 ENCOUNTER — Ambulatory Visit (INDEPENDENT_AMBULATORY_CARE_PROVIDER_SITE_OTHER): Payer: Medicare HMO | Admitting: Internal Medicine

## 2019-06-23 ENCOUNTER — Other Ambulatory Visit (INDEPENDENT_AMBULATORY_CARE_PROVIDER_SITE_OTHER): Payer: Self-pay | Admitting: Nurse Practitioner

## 2019-06-28 ENCOUNTER — Other Ambulatory Visit (INDEPENDENT_AMBULATORY_CARE_PROVIDER_SITE_OTHER): Payer: Self-pay | Admitting: Internal Medicine

## 2019-06-28 ENCOUNTER — Telehealth (INDEPENDENT_AMBULATORY_CARE_PROVIDER_SITE_OTHER): Payer: Self-pay | Admitting: Internal Medicine

## 2019-06-28 MED ORDER — GLIPIZIDE 5 MG PO TABS: 5.0000 mg | ORAL_TABLET | Freq: Two times a day (BID) | ORAL | 3 refills | Status: DC

## 2019-06-28 NOTE — Telephone Encounter (Signed)
Please let the patient know that Metformin extended release is not being approved by his insurance company so he needs to stop this medication. I have sent a new prescription for glipizide 5 mg twice a day for his diabetes in addition to the other medication he is taking for his diabetes.  He needs to watch his diet as we have discussed before. Follow-up as scheduled.  Thanks.

## 2019-07-07 DIAGNOSIS — Z0100 Encounter for examination of eyes and vision without abnormal findings: Secondary | ICD-10-CM | POA: Diagnosis not present

## 2019-07-07 DIAGNOSIS — H5200 Hypermetropia, unspecified eye: Secondary | ICD-10-CM | POA: Diagnosis not present

## 2019-07-07 LAB — HM DIABETES EYE EXAM

## 2019-07-08 ENCOUNTER — Ambulatory Visit (INDEPENDENT_AMBULATORY_CARE_PROVIDER_SITE_OTHER): Payer: Medicare HMO | Admitting: Nurse Practitioner

## 2019-07-08 ENCOUNTER — Encounter (INDEPENDENT_AMBULATORY_CARE_PROVIDER_SITE_OTHER): Payer: Self-pay | Admitting: Nurse Practitioner

## 2019-07-08 ENCOUNTER — Other Ambulatory Visit: Payer: Self-pay

## 2019-07-08 VITALS — BP 140/88 | HR 62 | Temp 98.2°F | Ht 70.0 in | Wt 230.0 lb

## 2019-07-08 DIAGNOSIS — Z122 Encounter for screening for malignant neoplasm of respiratory organs: Secondary | ICD-10-CM | POA: Insufficient documentation

## 2019-07-08 DIAGNOSIS — Z7189 Other specified counseling: Secondary | ICD-10-CM

## 2019-07-08 DIAGNOSIS — R195 Other fecal abnormalities: Secondary | ICD-10-CM | POA: Diagnosis not present

## 2019-07-08 DIAGNOSIS — Z1211 Encounter for screening for malignant neoplasm of colon: Secondary | ICD-10-CM | POA: Diagnosis not present

## 2019-07-08 DIAGNOSIS — E119 Type 2 diabetes mellitus without complications: Secondary | ICD-10-CM

## 2019-07-08 HISTORY — DX: Other fecal abnormalities: R19.5

## 2019-07-08 NOTE — Assessment & Plan Note (Signed)
I have supplied him with a fit kit for colon cancer screening and he tells me he will collect a sample and drop this off at the office sometime next week.

## 2019-07-08 NOTE — Assessment & Plan Note (Addendum)
I did discuss the risks and benefits associated with screening for lung cancer.  Especially the risk of exposure to radiation.  After this discussion, he opted to be screened for lung cancer and I will order low-dose CT scan of the lungs for lung cancer screening.

## 2019-07-08 NOTE — Assessment & Plan Note (Signed)
He will continue taking his medications as prescribed.  We will collect A1c at his next office visit once he has been on the medication for a little bit.  I also encouraged him to check his fasting blood sugar every day.  He tells me he currently has a glucometer and supplies at home.  I did give him a printout regarding his goals for fasting blood glucose.  His goal is fasting blood sugar of 80-1 30 we did discuss hypoglycemia and how to treat this.  Also we did discuss if his blood sugars are greater than 200 on 2 consecutive checks he should call this office.  He tells me he understands.

## 2019-07-08 NOTE — Patient Instructions (Signed)
Thank you for choosing [facility] as your medical provider! If you have any questions or concerns regarding your health care, please do not hesitate to call our office.  Blood sugars: Goal fasting blood sugar: 80-130 Call if your sugar is either <70 or >200 on 2 or more occasions between now and your next appointment. If your blood sugar is <80 and you have symptoms of low blood sugar which may include headache, lightheadedness, shaking, cold sweats, fatigue, irritability then drink either 4 ounces of soda or juice OR eat peanut butter crackers OR eat a piece of candy.  I have given you the card to get a stool sample for colon cancer screening.  Please bring this back to the office when able. I will place order for lung cancer screening via CT scan.  If you do not hear from someone to schedule this within the next 2 weeks please call our office. We will collect blood work at your next office visit in approximately 2 months. If you decide you want to fill out that pink most form, please let me know and we can do this at your next office visit.  Please follow-up as scheduled in 2 months. We look forward to seeing you again soon! Have a great Valentine's Day!!  At [facility] we value your feedback. You may receive a survey about your visit today. Please share your experience as we strive to create trusting relationships with our patients to provide genuine, compassionate, quality care.  We appreciate your understanding and patience as we review any laboratory studies, imaging, and other diagnostic tests that are ordered as we care for you. We do our best to address any and all results in a timely manner. If you do not hear about test results within 1 week, please do not hesitate to contact us. If we referred you to a specialist during your visit or ordered imaging testing, contact the office if you have not been contacted to be scheduled within 1 weeks.  We also encourage the  use of MyChart, which contains your medical information for your review as well. If you are not enrolled in this feature, an access code is on this after visit summary for your convenience. Thank you for allowing [place] to be involved in your care.

## 2019-07-08 NOTE — Assessment & Plan Note (Signed)
We did discuss the most form today in the office.  He would not like to fill 1 out right now, but I did provide him with a copy so that he can look over this at his leisure at home.

## 2019-07-08 NOTE — Progress Notes (Signed)
Subjective:  Patient ID: Alex Marks, male    DOB: 1945-09-16  Age: 74 y.o. MRN: [id number]  CC:  Chief Complaint  Patient presents with  . Follow-up      HPI  This patient arrives today for follow-up.  Type 2 diabetes: When I had talked to him at his last office visit we did discuss changing his Metformin from immediate release to extended release.  He tells me since then he is actually been started on glipizide which she has been taking for approximately 1 week.  He also tells me he continues taking his Invokana.  He does not check his blood sugars regularly at home.  He is not sure what the goal blood sugar range should be.  He denies any symptoms of hypoglycemia.  He did see his eye doctor yesterday and tells me that the eye doctor seemed happy with his exam.  Black stools: We talked last time he did report that he was having some black tarry stools.  Today, he tells me that his stools are only sometimes black and tarry, and usually just one portion of the stool.  He is not comfortable with undergoing rectal exam today and would rather take a part home to have his stool tested can screen for colon cancer.  Last blood work from September 2020 shows that he was not anemic.  Screenings: It appears that he is due for colon cancer, lung cancer screening.  I thought he was also due for hepatitis C screening, but the patient tells me he actually had a history of hepatitis C and was treated for this and has been cured of it.  I do see where this was noted upon reviewing his history in the chart.  Advance care planning: He was also interested last time we talked over the phone to discuss the most form and fill 1 of those out.  I plan on talking to him about this today.  Past Medical History:  Diagnosis Date  . COPD (chronic obstructive pulmonary disease) ([place])   . Depression   . Frequency of urination   . GAD (generalized anxiety disorder)   . GERD (gastroesophageal reflux disease)    . Hepatitis C    "was treated; was cured after 3 month" (05/30/2017)  . Hypercholesterolemia   . Hypertension   . PAD (peripheral artery disease) ([place])   . Scalp lesion 04/12/2019  . Substance abuse ([place])   . Transfusion of blood product refused for religious reason   . Type II diabetes mellitus ([facility])       Family History  Problem Relation Age of Onset  . Alzheimer's disease Mother   . Mental illness Mother        Depression  . Breast cancer Mother   . Cancer Father        bladder?  [name] Hypertension Father   . Alcohol abuse Father   . Heart disease Sister   . Hearing loss Sister        valve  . Alcohol abuse Brother   . Diabetes Paternal Aunt   . COPD Sister   . Alcohol abuse Brother   . Alcohol abuse Brother   . Alcohol abuse Brother   . Early death Brother        MVA  . Cancer Maternal Grandmother     Social History   Social History Narrative   Single.Live in boarding house   Renting a room   looking for apartment  Never had license   Likes to read   Social History   Tobacco Use  . Smoking status: Current Every Day Smoker    Packs/day: 1.00    Years: 57.00    Pack years: 57.00    Types: Cigarettes    Start date: 07/01/1961  . Smokeless tobacco: Never Used  . Tobacco comment: Has tried to quit, but his depressed mood makes it challenging to quit  Substance Use Topics  . Alcohol use: Not Currently    Comment: 06/07/19 hx:"nothing since ~ 2012"; patients states hasn't drank "in a while"     Current Meds  Medication Sig  . ACCU-CHEK AVIVA PLUS test strip USE AS DIRECTED TO TEST TWICE DAILY AS NEEDED  . amLODipine (NORVASC) 10 MG tablet TAKE 1 TABLET BY MOUTH ONCE DAILY  . aspirin 325 MG EC tablet Take 325 mg by mouth daily. May take an additional 325 mgs as needed for sleep  . aspirin-sod bicarb-citric acid (ALKA-SELTZER) 325 MG TBEF tablet Take 325 mg by mouth daily as needed (indigestion).  [name] atorvastatin (LIPITOR) 10 MG tablet TAKE 1 TABLET BY MOUTH  ONCE DAILY  . blood glucose meter kit and supplies KIT Dx   e11.9  . Cholecalciferol 1.25 MG (50000 UT) TABS Take 10,000 Units by mouth.  [name] glipiZIDE (GLUCOTROL) 5 MG tablet Take 1 tablet (5 mg total) by mouth 2 (two) times daily before a meal.  . INVOKANA 100 MG TABS tablet TAKE 1 TABLET BY MOUTH ONCE DAILY BEFORE BREAKFAST  . losartan (COZAAR) 100 MG tablet TAKE 1 TABLET BY MOUTH ONCE DAILY  . Menthol, Topical Analgesic, (ICY HOT EX) Apply 1 application topically daily as needed (muscle pain).  . Multiple Vitamin (MULTIVITAMIN WITH MINERALS) TABS tablet Take 1 tablet by mouth daily.  . Naphazoline-Glycerin (REDNESS RELIEF OP) Place 1 drop into both eyes daily as needed (redness).  . Omega-3 Fatty Acids (FISH OIL PEARLS) 300 MG CAPS Take 600 mg by mouth.  . vitamin C (ASCORBIC ACID) 500 MG tablet Take 500 mg by mouth daily.    ROS:  Review of Systems  Constitutional: Positive for malaise/fatigue. Negative for fever.  Eyes: Negative for blurred vision.  Respiratory: Negative.   Cardiovascular: Negative.   Neurological: Negative for dizziness, sensory change, weakness and headaches.     Objective:   Today's Vitals: BP 140/88 (BP Location: Left Arm, Patient Position: Sitting, Cuff Size: Normal)   Pulse 62   Temp 98.2 F (36.8 C) (Temporal)   Ht '5\' 10"'  (1.778 m)   Wt 230 lb (104.3 kg)   SpO2 97%   BMI 33.00 kg/m  Vitals with BMI 07/08/2019 05/17/2019 04/12/2019  Height '5\' 10"'  '5\' 10"'  '5\' 10"'   Weight 230 lbs 221 lbs 6 oz 220 lbs 6 oz  BMI 33 46.50 35.46  Systolic 568 127 517  Diastolic 88 90 68  Pulse 62 72 72     Physical Exam Vitals reviewed.  Constitutional:      Appearance: Normal appearance.  HENT:     Head: Normocephalic and atraumatic.  Cardiovascular:     Rate and Rhythm: Normal rate and regular rhythm.  Pulmonary:     Effort: Pulmonary effort is normal.     Breath sounds: Normal breath sounds.  Musculoskeletal:     Cervical back: Neck supple.  Skin:     General: Skin is warm and dry.  Neurological:     Mental Status: He is alert and oriented to person, place, and time.  Psychiatric:  Mood and Affect: Mood normal.        Behavior: Behavior normal.        Thought Content: Thought content normal.        Judgment: Judgment normal.          Assessment   No diagnosis found.    Tests ordered No orders of the defined types were placed in this encounter.    Plan: Please see assessment and plan per problem list below.   No orders of the defined types were placed in this encounter.   Patient to follow-up in approximately 2 months.  Ailene Ards, NP

## 2019-07-08 NOTE — Assessment & Plan Note (Signed)
He was not comfortable with having his rectum examined in office.  We will await FIT testing for further evaluation.  He was encouraged to call me if this worsens or if he starts to notice bright red blood with bowel movements.

## 2019-07-12 ENCOUNTER — Other Ambulatory Visit (INDEPENDENT_AMBULATORY_CARE_PROVIDER_SITE_OTHER): Payer: Self-pay

## 2019-07-12 MED ORDER — GLIPIZIDE 5 MG PO TABS: 5.0000 mg | ORAL_TABLET | Freq: Two times a day (BID) | ORAL | 3 refills | Status: DC

## 2019-07-14 ENCOUNTER — Other Ambulatory Visit (INDEPENDENT_AMBULATORY_CARE_PROVIDER_SITE_OTHER): Payer: Self-pay

## 2019-07-14 DIAGNOSIS — E119 Type 2 diabetes mellitus without complications: Secondary | ICD-10-CM

## 2019-07-14 MED ORDER — ATORVASTATIN CALCIUM 10 MG PO TABS: 10.0000 mg | ORAL_TABLET | Freq: Every day | ORAL | 0 refills | Status: DC

## 2019-07-14 MED ORDER — CANAGLIFLOZIN 100 MG PO TABS: 100.0000 mg | ORAL_TABLET | Freq: Every day | ORAL | 0 refills | Status: DC

## 2019-07-14 MED ORDER — AMLODIPINE BESYLATE 10 MG PO TABS: 10.0000 mg | ORAL_TABLET | Freq: Every day | ORAL | 0 refills | Status: DC

## 2019-07-15 MED ORDER — FISH OIL PEARLS 300 MG PO CAPS: 600.0000 mg | ORAL_CAPSULE | Freq: Two times a day (BID) | ORAL | 3 refills | Status: AC

## 2019-07-15 MED ORDER — CHOLECALCIFEROL 1.25 MG (50000 UT) PO TABS: 10000.0000 [IU] | ORAL_TABLET | ORAL | 3 refills | Status: DC

## 2019-07-15 MED ORDER — ASPIRIN 325 MG PO TBEC: 325.0000 mg | DELAYED_RELEASE_TABLET | Freq: Every day | ORAL | 0 refills | Status: DC

## 2019-07-15 MED ORDER — LOSARTAN POTASSIUM 100 MG PO TABS: 100.0000 mg | ORAL_TABLET | Freq: Every day | ORAL | 0 refills | Status: DC

## 2019-07-20 ENCOUNTER — Ambulatory Visit: Payer: Medicare HMO | Admitting: Urology

## 2019-07-26 ENCOUNTER — Ambulatory Visit (HOSPITAL_COMMUNITY)
Admission: RE | Admit: 2019-07-26 | Discharge: 2019-07-26 | Disposition: A | Discharge status: Home / Self Care | Payer: Medicare HMO | Source: Ambulatory Visit | Attending: Nurse Practitioner | Admitting: Nurse Practitioner

## 2019-07-26 ENCOUNTER — Other Ambulatory Visit: Payer: Self-pay

## 2019-07-26 DIAGNOSIS — F17210 Nicotine dependence, cigarettes, uncomplicated: Secondary | ICD-10-CM | POA: Diagnosis not present

## 2019-07-26 DIAGNOSIS — Z122 Encounter for screening for malignant neoplasm of respiratory organs: Secondary | ICD-10-CM

## 2019-07-30 ENCOUNTER — Ambulatory Visit: Payer: Medicare HMO

## 2019-08-05 ENCOUNTER — Ambulatory Visit: Payer: Medicare HMO | Attending: Internal Medicine

## 2019-08-05 DIAGNOSIS — Z23 Encounter for immunization: Secondary | ICD-10-CM | POA: Insufficient documentation

## 2019-08-05 NOTE — Progress Notes (Signed)
   Covid-19 Vaccination Clinic  Name:  Alex Marks    MRN: [id number] DOB: 1946-06-15  08/05/2019  Alex Marks was observed post Covid-19 immunization for 15 minutes without incidence. He was provided with Vaccine Information Sheet and instruction to access the V-Safe system.   Alex Marks was instructed to call 911 with any severe reactions post vaccine: [name] Difficulty breathing  . Swelling of your face and throat  . A fast heartbeat  . A bad rash all over your body  . Dizziness and weakness    Immunizations Administered    Name Date Dose VIS Date Route   Pfizer COVID-19 Vaccine 08/05/2019  8:56 AM 0.3 mL 06/11/2019 Intramuscular   Manufacturer: [employer], [employer]   Lot: [id number]   NDC: [address]-0

## 2019-08-10 ENCOUNTER — Ambulatory Visit: Payer: Medicare HMO

## 2019-08-30 ENCOUNTER — Ambulatory Visit: Payer: Medicare HMO | Attending: Internal Medicine

## 2019-08-30 DIAGNOSIS — Z23 Encounter for immunization: Secondary | ICD-10-CM | POA: Insufficient documentation

## 2019-08-30 NOTE — Progress Notes (Signed)
   Covid-19 Vaccination Clinic  Name:  Alex Marks    MRN: [id number] DOB: [birth year]  08/30/2019  [name] was observed post Covid-19 immunization for 15 minutes without incidence. He was provided with Vaccine Information Sheet and instruction to access the V-Safe system.   [name] was instructed to call 911 with any severe reactions post vaccine: [name] Difficulty breathing  . Swelling of your face and throat  . A fast heartbeat  . A bad rash all over your body  . Dizziness and weakness    Immunizations Administered    Name Date Dose VIS Date Route   Pfizer COVID-19 Vaccine 08/30/2019 12:45 PM 0.3 mL 06/11/2019 Intramuscular   Manufacturer: [employer], [employer]   Lot: [id number]   NDC: [address]-5

## 2019-08-31 ENCOUNTER — Other Ambulatory Visit: Payer: Self-pay | Admitting: Urology

## 2019-08-31 ENCOUNTER — Ambulatory Visit (INDEPENDENT_AMBULATORY_CARE_PROVIDER_SITE_OTHER): Payer: Medicare HMO | Admitting: Urology

## 2019-08-31 ENCOUNTER — Encounter: Payer: Self-pay | Admitting: Urology

## 2019-08-31 ENCOUNTER — Other Ambulatory Visit: Payer: Self-pay

## 2019-08-31 VITALS — BP 150/75 | HR 67 | Temp 97.3°F | Ht 70.0 in | Wt 230.0 lb

## 2019-08-31 DIAGNOSIS — R350 Frequency of micturition: Secondary | ICD-10-CM

## 2019-08-31 DIAGNOSIS — N521 Erectile dysfunction due to diseases classified elsewhere: Secondary | ICD-10-CM | POA: Diagnosis not present

## 2019-08-31 LAB — POCT URINALYSIS DIPSTICK
Bilirubin, UA: NEGATIVE
Glucose, UA: POSITIVE — AB
Ketones, UA: NEGATIVE
Leukocytes, UA: NEGATIVE
Nitrite, UA: NEGATIVE
Protein, UA: POSITIVE — AB
Spec Grav, UA: <=1.005 — AB (ref 1.010–1.025)
Urobilinogen, UA: NEGATIVE E.U./dL — AB
pH, UA: 6.5 (ref 5.0–8.0)

## 2019-08-31 MED ORDER — SILDENAFIL CITRATE 100 MG PO TABS: 100.0000 mg | ORAL_TABLET | Freq: Every day | ORAL | 11 refills | Status: DC | PRN

## 2019-08-31 NOTE — Progress Notes (Addendum)
H&P  Chief Complaint: Hematospermia  History of Present Illness:   3.2.2021: He presents today referred for evaluation of recent episode of hematospermia (only twice this last December, no recurrences since). He describes these episodes as having had dark brown/pink flecks in his semen. He denies having had any blood per urine nor having had any urinary sx's/complaints. He also denies any blood per stool. This man is diabetic and hypertensive -- both of these conditions are being treated w/ medical therapies. He denies any family hx of prostate cancer.   He also c/o intermittent issues with ED -- he has used cilias for this and reports that this was quite effective. He is interested in resuming use of oral medications for his ED.  Past Medical History:  Diagnosis Date  . Anxiety   . COPD (chronic obstructive pulmonary disease) ([place])   . Depression   . Depression   . Frequency of urination   . GAD (generalized anxiety disorder)   . GERD (gastroesophageal reflux disease)   . Hepatitis C    "was treated; was cured after 3 month" (05/30/2017)  . Hypercholesterolemia   . Hypertension   . PAD (peripheral artery disease) ([place])   . Scalp lesion 04/12/2019  . Substance abuse ([place])   . Transfusion of blood product refused for religious reason   . Type II diabetes mellitus ([place])     Past Surgical History:  Procedure Laterality Date  . ABDOMINAL AORTOGRAM W/LOWER EXTREMITY Bilateral 05/30/2017  . ABDOMINAL AORTOGRAM W/LOWER EXTREMITY N/A 05/30/2017   Procedure: ABDOMINAL AORTOGRAM W/LOWER EXTREMITY;  Surgeon: Elam Dutch, MD;  Location: [facility];  Service: Cardiovascular;  Laterality: N/A;  Bilateral    Home Medications:  Allergies as of 08/31/2019      Reactions   Onion    Bags under eyes, indigestion    Other    Hot sauce -- causes eye swelling      Medication List       Accurate as of August 31, 2019 10:38 AM. If you have any questions, ask your nurse or doctor.        Accu-Chek Aviva Plus test strip Generic drug: glucose blood USE AS DIRECTED TO TEST TWICE DAILY AS NEEDED   amLODipine 10 MG tablet Commonly known as: NORVASC Take 1 tablet (10 mg total) by mouth daily.   aspirin 325 MG EC tablet Take 1 tablet (325 mg total) by mouth daily. May take an additional 325 mgs as needed for sleep   aspirin-sod bicarb-citric acid 325 MG Tbef tablet Commonly known as: ALKA-SELTZER Take 325 mg by mouth daily as needed (indigestion).   atorvastatin 10 MG tablet Commonly known as: LIPITOR Take 1 tablet (10 mg total) by mouth daily.   blood glucose meter kit and supplies Kit Dx   e11.9   canagliflozin 100 MG Tabs tablet Commonly known as: Invokana Take 1 tablet (100 mg total) by mouth daily before breakfast.   Cholecalciferol 1.25 MG (50000 UT) Tabs Take 10,000 Units by mouth once a week.   Fish Oil Pearls 300 MG Caps Take 600 mg by mouth 2 (two) times daily.   glipiZIDE 5 MG tablet Commonly known as: GLUCOTROL Take 1 tablet (5 mg total) by mouth 2 (two) times daily before a meal.   ICY HOT EX Apply 1 application topically daily as needed (muscle pain).   losartan 100 MG tablet Commonly known as: COZAAR Take 1 tablet (100 mg total) by mouth daily.   multivitamin with minerals  Tabs tablet Take 1 tablet by mouth daily.   REDNESS RELIEF OP Place 1 drop into both eyes daily as needed (redness).   vitamin C 500 MG tablet Commonly known as: ASCORBIC ACID Take 500 mg by mouth daily.       Allergies:  Allergies  Allergen Reactions  . Onion     Bags under eyes, indigestion   . Other     Hot sauce -- causes eye swelling    Family History  Problem Relation Age of Onset  . Alzheimer's disease Mother   . Mental illness Mother        Depression  . Breast cancer Mother   . Cancer Father        bladder?  [name] Hypertension Father   . Alcohol abuse Father   . Heart disease Sister   . Hearing loss Sister        valve  . Alcohol  abuse Brother   . Diabetes Paternal Aunt   . COPD Sister   . Alcohol abuse Brother   . Alcohol abuse Brother   . Alcohol abuse Brother   . Early death Brother        MVA  . Cancer Maternal Grandmother     Social History:  reports that he has been smoking cigarettes. He started smoking about 58 years ago. He has a 57.00 pack-year smoking history. He has never used smokeless tobacco. He reports previous alcohol use. He reports previous drug use. Drugs: Marijuana, Cocaine, Heroin, and "Crack" cocaine.  ROS: A complete review of systems was performed.  All systems are negative except for pertinent findings as noted.  Physical Exam:  Vital signs in last 24 hours: BP (!) 150/75   Pulse 67   Temp (!) 97.3 F (36.3 C)   Ht 5' 10" (1.778 m)   Wt 230 lb (104.3 kg)   BMI 33.00 kg/m  Constitutional:  Alert and oriented, No acute distress Cardiovascular: Regular rate  Respiratory: Normal respiratory effort GI: Abdomen is soft, nontender, nondistended, no abdominal masses. No CVAT. No hernias. Genitourinary: Normal male phallus, testes are descended bilaterally and non-tender and without masses, scrotum is normal in appearance without lesions or masses, perineum is normal on inspection. Prostate feels around 40 grams in size. Lymphatic: No lymphadenopathy Neurologic: Grossly intact, no focal deficits Psychiatric: Normal mood and affect  Laboratory Data:  No results for input(s): WBC, HGB, HCT, PLT in the last 72 hours.  No results for input(s): NA, K, CL, GLUCOSE, BUN, CALCIUM, CREATININE in the last 72 hours.  Invalid input(s): CO3   Results for orders placed or performed in visit on 08/31/19 (from the past 24 hour(s))  POCT urinalysis dipstick     Status: Abnormal   Collection Time: 08/31/19 10:08 AM  Result Value Ref Range   Color, UA yellow    Clarity, UA clear    Glucose, UA Positive (A) Negative   Bilirubin, UA neg    Ketones, UA neg    Spec Grav, UA <=1.005 (A) 1.010 -  1.025   Blood, UA hemolyzed    pH, UA 6.5 5.0 - 8.0   Protein, UA Positive (A) Negative   Urobilinogen, UA negative (A) 0.2 or 1.0 E.U./dL   Nitrite, UA neg    Leukocytes, UA Negative Negative   Appearance clear    Odor     No results found for this or any previous visit (from the past 240 hour(s)).  Renal Function: No results for input(s): CREATININE in  the last 168 hours. CrCl cannot be calculated (Patient's most recent lab result is older than the maximum 21 days allowed.).  Radiologic Imaging: No results found.  Impression/Assessment:  It is likely that these two fleeting episodes of possible hematospermia were secondary to some benign inflammation process. Seeing as he has had no recurrences since this last December, I am not too concerned. DRE today is normal.  He is fine to resume oral medications w/ sildenafil for management of his ED.  He has not had regular PSA checks -- will check this today.  Plan:  1. He was assured that these recent episodes of hematospermia are no cause for concern and that I am not at all suspicious for cancer or any other disease process.   2. I have written him a new prescription for sildenafil -- we discussed proper use and potential side effects.   3. PSA today -- will forward results.   4. Return PRN

## 2019-08-31 NOTE — Progress Notes (Signed)
Urological Symptom Review  Patient is experiencing the following symptoms: Frequency Get up at night Weak stream Erection problems   Review of Systems  Gastrointestinal (upper)  : Negative for upper GI symptoms  Gastrointestinal (lower) : Negative for lower GI symptoms  Constitutional : Negative for symptoms  Skin: Negative for skin symptoms  Eyes: Negative for eye symptoms  Ear/Nose/Throat : Negative for Ear/Nose/Throat symptoms  Hematologic/Lymphatic: Negative for Hematologic/Lymphatic symptoms  Cardiovascular : Negative for cardiovascular symptoms  Respiratory : Negative for respiratory symptoms  Endocrine: Negative for endocrine symptoms  Musculoskeletal: Negative for musculoskeletal symptoms  Neurological: Negative for neurological symptoms  Psychologic: Negative for psychiatric symptoms

## 2019-09-01 LAB — PSA: PSA: 1.7 ng/mL (ref <=4.0)

## 2019-09-02 ENCOUNTER — Ambulatory Visit (INDEPENDENT_AMBULATORY_CARE_PROVIDER_SITE_OTHER): Payer: Medicare HMO | Admitting: Nurse Practitioner

## 2019-09-02 ENCOUNTER — Other Ambulatory Visit: Payer: Self-pay

## 2019-09-02 ENCOUNTER — Encounter (INDEPENDENT_AMBULATORY_CARE_PROVIDER_SITE_OTHER): Payer: Self-pay | Admitting: Nurse Practitioner

## 2019-09-02 ENCOUNTER — Telehealth: Payer: Self-pay

## 2019-09-02 VITALS — BP 140/80 | HR 64 | Temp 98.1°F | Ht 70.0 in | Wt 231.0 lb

## 2019-09-02 DIAGNOSIS — E119 Type 2 diabetes mellitus without complications: Secondary | ICD-10-CM

## 2019-09-02 DIAGNOSIS — E782 Mixed hyperlipidemia: Secondary | ICD-10-CM | POA: Diagnosis not present

## 2019-09-02 DIAGNOSIS — Z1211 Encounter for screening for malignant neoplasm of colon: Secondary | ICD-10-CM

## 2019-09-02 DIAGNOSIS — F320 Major depressive disorder, single episode, mild: Secondary | ICD-10-CM

## 2019-09-02 DIAGNOSIS — I10 Essential (primary) hypertension: Secondary | ICD-10-CM | POA: Diagnosis not present

## 2019-09-02 DIAGNOSIS — F32A Depression, unspecified: Secondary | ICD-10-CM | POA: Insufficient documentation

## 2019-09-02 DIAGNOSIS — Z720 Tobacco use: Secondary | ICD-10-CM | POA: Diagnosis not present

## 2019-09-02 DIAGNOSIS — F329 Major depressive disorder, single episode, unspecified: Secondary | ICD-10-CM | POA: Insufficient documentation

## 2019-09-02 DIAGNOSIS — F330 Major depressive disorder, recurrent, mild: Secondary | ICD-10-CM | POA: Insufficient documentation

## 2019-09-02 MED ORDER — METFORMIN HCL 500 MG PO TABS: 500.0000 mg | ORAL_TABLET | Freq: Every day | ORAL | 3 refills | Status: DC

## 2019-09-02 NOTE — Telephone Encounter (Signed)
Pt notified of results

## 2019-09-02 NOTE — Progress Notes (Signed)
Subjective:  Patient ID: Alex Marks, male    DOB: 09/15/1945  Age: 74 y.o. MRN: [id number]  CC:  Chief Complaint  Patient presents with  . Diabetes  . Other    Hypertension, hyperlipidemia, lung cancer screening, colon cancer screening      HPI  This patient comes in today for the above.  Type 2 diabetes: Patient continues on glipizide 5 mg twice a day and Invokana.  He is not on Metformin due to negative GI side effects specifically diarrhea.  Eye exam has been completed within the last year and was negative for diabetic retinopathy.  Last A1c was 9.9 in September 2020.  He does admit to not following a diet and low processed carbohydrates.  He tells me he drinks Coke frequently.  He tells me that he feels quite depressed, and feels this is what causes his lack of motivation to change his diet.  He denies suicidal ideation, but does not want to start any medication.  He tells me he feels that the news cycle has a impact on his mood.  Hypertension: He continues on his losartan and amlodipine.  Hyperlipidemia: He continues on his atorvastatin, last lipid panel was collected in January 2020.  LDL at that time was 59.  Lung cancer screening: Screening CT scan completed in January 2021, it was stable and appeared benign.  Recommendations is to repeat in January 2022.  Colon cancer screening: He has a FIT kit at home for colon cancer screening, he has not provided [place] with a sample as of yet.   Past Medical History:  Diagnosis Date  . Anxiety   . COPD (chronic obstructive pulmonary disease) ([place])   . Depression   . Depression   . Frequency of urination   . GAD (generalized anxiety disorder)   . GERD (gastroesophageal reflux disease)   . Hepatitis C    "was treated; was cured after 3 month" (05/30/2017)  . Hypercholesterolemia   . Hypertension   . PAD (peripheral artery disease) ([place])   . Scalp lesion 04/12/2019  . Substance abuse ([place])   . Transfusion of blood product  refused for religious reason   . Type II diabetes mellitus ([facility])       Family History  Problem Relation Age of Onset  . Alzheimer's disease Mother   . Mental illness Mother        Depression  . Breast cancer Mother   . Cancer Father        bladder?  [name] Hypertension Father   . Alcohol abuse Father   . Heart disease Sister   . Hearing loss Sister        valve  . Alcohol abuse Brother   . Diabetes Paternal Aunt   . COPD Sister   . Alcohol abuse Brother   . Alcohol abuse Brother   . Alcohol abuse Brother   . Early death Brother        MVA  . Cancer Maternal Grandmother     Social History   Social History Narrative   Single.Live in boarding house   Renting a room   looking for apartment   Never had license   Likes to read   Social History   Tobacco Use  . Smoking status: Current Every Day Smoker    Packs/day: 1.00    Years: 57.00    Pack years: 57.00    Types: Cigarettes    Start date: 07/01/1961  . Smokeless tobacco: Never  Used  . Tobacco comment: Has tried to quit, but his depressed mood makes it challenging to quit  Substance Use Topics  . Alcohol use: Not Currently    Comment: 06/07/19 hx:"nothing since ~ 2012"; patients states hasn't drank "in a while"     Current Meds  Medication Sig  . ACCU-CHEK AVIVA PLUS test strip USE AS DIRECTED TO TEST TWICE DAILY AS NEEDED  . amLODipine (NORVASC) 10 MG tablet Take 1 tablet (10 mg total) by mouth daily.  [name] aspirin 325 MG EC tablet Take 1 tablet (325 mg total) by mouth daily. May take an additional 325 mgs as needed for sleep  . aspirin-sod bicarb-citric acid (ALKA-SELTZER) 325 MG TBEF tablet Take 325 mg by mouth daily as needed (indigestion).  [name] atorvastatin (LIPITOR) 10 MG tablet Take 1 tablet (10 mg total) by mouth daily.  . blood glucose meter kit and supplies KIT Dx   e11.9  . canagliflozin (INVOKANA) 100 MG TABS tablet Take 1 tablet (100 mg total) by mouth daily before breakfast.  . Cholecalciferol 1.25 MG  (50000 UT) TABS Take 10,000 Units by mouth once a week.  [name] glipiZIDE (GLUCOTROL) 5 MG tablet Take 1 tablet (5 mg total) by mouth 2 (two) times daily before a meal.  . losartan (COZAAR) 100 MG tablet Take 1 tablet (100 mg total) by mouth daily.  . Menthol, Topical Analgesic, (ICY HOT EX) Apply 1 application topically daily as needed (muscle pain).  . Multiple Vitamin (MULTIVITAMIN WITH MINERALS) TABS tablet Take 1 tablet by mouth daily.  . Naphazoline-Glycerin (REDNESS RELIEF OP) Place 1 drop into both eyes daily as needed (redness).  . Omega-3 Fatty Acids (FISH OIL PEARLS) 300 MG CAPS Take 600 mg by mouth 2 (two) times daily.  . sildenafil (VIAGRA) 100 MG tablet Take 1 tablet (100 mg total) by mouth daily as needed for erectile dysfunction.  . vitamin C (ASCORBIC ACID) 500 MG tablet Take 500 mg by mouth daily.    ROS:  Negative unless otherwise stated in HPI.  Objective:   Today's Vitals: BP 140/80 (BP Location: Left Arm, Patient Position: Sitting, Cuff Size: Normal)   Pulse 64   Temp 98.1 F (36.7 C) (Temporal)   Ht '5\' 10"'  (1.778 m)   Wt 231 lb (104.8 kg)   SpO2 96%   BMI 33.15 kg/m  Vitals with BMI 09/02/2019 08/31/2019 07/08/2019  Height '5\' 10"'  '5\' 10"'  '5\' 10"'   Weight 231 lbs 230 lbs 230 lbs  BMI 04.54 33 33  Systolic 098 119 147  Diastolic 80 75 88  Pulse 64 67 62     Physical Exam Vitals reviewed.  Constitutional:      Appearance: Normal appearance.  HENT:     Head: Normocephalic and atraumatic.  Cardiovascular:     Rate and Rhythm: Normal rate and regular rhythm.  Pulmonary:     Effort: Pulmonary effort is normal.     Breath sounds: Normal breath sounds.  Musculoskeletal:     Cervical back: Neck supple.  Skin:    General: Skin is warm and dry.  Neurological:     Mental Status: He is alert and oriented to person, place, and time.  Psychiatric:        Mood and Affect: Mood normal.        Behavior: Behavior normal.        Thought Content: Thought content normal.         Judgment: Judgment normal.  Assessment   1. Essential hypertension   2. Type 2 diabetes mellitus without complication, without long-term current use of insulin ([place])   3. Mixed hyperlipidemia   4. Screen for colon cancer   5. Tobacco abuse   6. Current mild episode of major depressive disorder, unspecified whether recurrent ([place])      Tests ordered Orders Placed This Encounter  Procedures  . Hemoglobin A1c  . Lipid Panel  . CMP with eGFR([facility])  . Microalbumin/Creatinine Ratio, Urine     Plan: 1.  He will continue on his current regimen for now  2.  With shared decision making we decided that he will trial low-dose of Metformin to see if he tolerates a lower dose.  I encouraged him to start 500 mg once daily.  We will collect P5T, metabolic panel, and check for microalbuminuria today.  3.  He will continue on his current medication regimen at this time.  We will collect lipid panel for further evaluation.  4.-5.  I encouraged him to collect a stool sample on the FIT kit and bring back to the office for colon cancer screening.  He will be due for lung cancer screening again in January 2022.  I encouraged him to try and quit smoking.  6.  I encouraged him to turn off the news, and only check it sparingly.  I encouraged him to let us know if he feels that his mood worsens and if he would like to trial medication to treat this, he tells me he will let me know.  Meds ordered this encounter  Medications  . metFORMIN (GLUCOPHAGE) 500 MG tablet    Sig: Take 1 tablet (500 mg total) by mouth daily with breakfast.    Dispense:  180 tablet    Refill:  3    Order Specific Question:   Supervising Provider    Answer:   Doree Albee [[id number]]    Patient to follow-up in 4 to 6 weeks for close follow-up regarding his diabetes and to determine if he can tolerate the low-dose of Metformin.  Ailene Ards, NP

## 2019-09-02 NOTE — Telephone Encounter (Signed)
-----   Message from Marcine Matar, MD sent at 09/02/2019 11:33 AM EST ----- Notify pt--good news psa 1.7--nml ----- Message ----- From: Ferdinand Lango, RN Sent: 09/01/2019   9:31 AM EST To: Marcine Matar, MD  Please review

## 2019-09-03 ENCOUNTER — Encounter (INDEPENDENT_AMBULATORY_CARE_PROVIDER_SITE_OTHER): Payer: Self-pay | Admitting: Nurse Practitioner

## 2019-09-03 LAB — LIPID PANEL
Cholesterol: 108 mg/dL (ref <200)
HDL: 27 mg/dL — ABNORMAL LOW (ref >=40)
LDL Cholesterol (Calc): 58 mg/dL (calc)
Non-HDL Cholesterol (Calc): 81 mg/dL (calc) (ref <130)
Total CHOL/HDL Ratio: 4 (calc) (ref <5.0)
Triglycerides: 148 mg/dL (ref <150)

## 2019-09-03 LAB — HEMOGLOBIN A1C
Hgb A1c MFr Bld: 7.8 % of total Hgb — ABNORMAL HIGH (ref <5.7)
Mean Plasma Glucose: 177 (calc)
eAG (mmol/L): 9.8 (calc)

## 2019-09-03 LAB — COMPLETE METABOLIC PANEL WITH GFR
AG Ratio: 1.4 (calc) (ref 1.0–2.5)
ALT: 20 U/L (ref 9–46)
AST: 18 U/L (ref 10–35)
Albumin: 3.9 g/dL (ref 3.6–5.1)
Alkaline phosphatase (APISO): 111 U/L (ref 35–144)
BUN: 10 mg/dL (ref 7–25)
CO2: 23 mmol/L (ref 20–32)
Calcium: 8.8 mg/dL (ref 8.6–10.3)
Chloride: 109 mmol/L (ref 98–110)
Creat: 0.81 mg/dL (ref 0.70–1.18)
GFR, Est African American: 103 mL/min/{1.73_m2} (ref >=60)
GFR, Est Non African American: 89 mL/min/{1.73_m2} (ref >=60)
Globulin: 2.7 g/dL (calc) (ref 1.9–3.7)
Glucose, Bld: 199 mg/dL — ABNORMAL HIGH (ref 65–99)
Potassium: 3.9 mmol/L (ref 3.5–5.3)
Sodium: 140 mmol/L (ref 135–146)
Total Bilirubin: 0.5 mg/dL (ref 0.2–1.2)
Total Protein: 6.6 g/dL (ref 6.1–8.1)

## 2019-09-03 LAB — MICROALBUMIN / CREATININE URINE RATIO
Creatinine, Urine: 32 mg/dL (ref 20–320)
Microalb Creat Ratio: 194 mcg/mg creat — ABNORMAL HIGH (ref <30)
Microalb, Ur: 6.2 mg/dL

## 2019-09-08 DIAGNOSIS — Z1211 Encounter for screening for malignant neoplasm of colon: Secondary | ICD-10-CM | POA: Diagnosis not present

## 2019-09-09 LAB — FECAL GLOBIN BY IMMUNOCHEMISTRY: FECAL GLOBIN RESULT:: DETECTED — AB

## 2019-09-14 ENCOUNTER — Telehealth (INDEPENDENT_AMBULATORY_CARE_PROVIDER_SITE_OTHER): Payer: Self-pay

## 2019-09-14 NOTE — Telephone Encounter (Signed)
Opened in error

## 2019-09-15 ENCOUNTER — Encounter (INDEPENDENT_AMBULATORY_CARE_PROVIDER_SITE_OTHER): Payer: Self-pay | Admitting: *Deleted

## 2019-09-15 NOTE — Addendum Note (Signed)
Addended by: Elenore Paddy on: 09/15/2019 01:33 PM   Modules accepted: Orders

## 2019-09-15 NOTE — Progress Notes (Signed)
Update: Patient's fecal immunoglobulin testing results have shown abnormalities.  He was notified, and told that he should be seen by gastroenterology.  He would like to be referred, he does not have a preference for specific provider in the area.  Referral ordered.

## 2019-09-20 DIAGNOSIS — L84 Corns and callosities: Secondary | ICD-10-CM | POA: Diagnosis not present

## 2019-09-20 DIAGNOSIS — E1151 Type 2 diabetes mellitus with diabetic peripheral angiopathy without gangrene: Secondary | ICD-10-CM | POA: Diagnosis not present

## 2019-09-20 DIAGNOSIS — I739 Peripheral vascular disease, unspecified: Secondary | ICD-10-CM | POA: Diagnosis not present

## 2019-09-20 DIAGNOSIS — L603 Nail dystrophy: Secondary | ICD-10-CM | POA: Diagnosis not present

## 2019-09-21 ENCOUNTER — Encounter: Payer: Self-pay | Admitting: Internal Medicine

## 2019-10-06 ENCOUNTER — Encounter (INDEPENDENT_AMBULATORY_CARE_PROVIDER_SITE_OTHER): Payer: Self-pay | Admitting: Nurse Practitioner

## 2019-10-06 ENCOUNTER — Ambulatory Visit (INDEPENDENT_AMBULATORY_CARE_PROVIDER_SITE_OTHER): Payer: Medicare HMO | Admitting: Nurse Practitioner

## 2019-10-06 ENCOUNTER — Other Ambulatory Visit: Payer: Self-pay

## 2019-10-06 VITALS — BP 148/82 | HR 67 | Temp 97.3°F | Ht 70.0 in | Wt 232.8 lb

## 2019-10-06 DIAGNOSIS — E119 Type 2 diabetes mellitus without complications: Secondary | ICD-10-CM | POA: Diagnosis not present

## 2019-10-06 DIAGNOSIS — N529 Male erectile dysfunction, unspecified: Secondary | ICD-10-CM

## 2019-10-06 DIAGNOSIS — I10 Essential (primary) hypertension: Secondary | ICD-10-CM

## 2019-10-06 MED ORDER — TADALAFIL 20 MG PO TABS: 10.0000 mg | ORAL_TABLET | ORAL | 2 refills | Status: DC | PRN

## 2019-10-06 NOTE — Progress Notes (Signed)
Referring Provider: Ailene Ards, NP Primary Care Physician:  Doree Albee, MD Primary Gastroenterologist:  Dr. Gala Romney  Chief Complaint  Patient presents with  . Blood In Stools    HPI:   Alex Marks is a 74 y.o. male presenting today at the request of Ailene Ards, NP for heme positive stool.   Patient completed FIT testing with PCP for colon cancer screening which came back positive on 09/08/19. CMP at that time with glucose 199, otherwise within normal limits. Last hemoglobin on file from September 2020 17.6.   Today:  Never had a colonoscopy. No abdominal pain. No constipation. Occasional diarrhea with metformin. This has improved since decreasing dosing. No blood in the stool or black stool. No unintentional weight loss. No nausea, vomiting, GERD symptoms, or dysphagia. No family history of colon cancer.   No alcohol for about 10 years. No drugs in the last 10 years.   History of hepatitis C. Treated by Dr. Paulita Fujita for this in 2017. HCV RNA 1,160,000 in May 2017. HCV RNA not detected in September 2017.  Ultrasound abdomen complete with elastography 12/07/2015.  Liver appeared within normal limits and parenchymal echogenicity, no focal liver lesion. Metavir fibrosis score: F2/F3.  With this fibrosis score, he should be receiving serial ultrasounds. Patient states he was cured.   Denies swelling of lower extremities or abdomen, yellowing of the eyes, dark urine, confusion, easy bruising or bleeding.  Has had COVID-19 vaccine series.   Taking 325 mg aspirin and alka seltzer with aspirin at night for many years.  Aspirin for history of PAD.  Alka-Seltzer for prevention of cold.  Used to take [employer] but stopped many years ago.   Past Medical History:  Diagnosis Date  . Anxiety   . COPD (chronic obstructive pulmonary disease) ([place])   . Depression   . Depression   . Frequency of urination   . GAD (generalized anxiety disorder)   . GERD (gastroesophageal reflux  disease)   . Hepatitis C    "was treated; was cured after 3 month" (05/30/2017)  . Hypercholesterolemia   . Hypertension   . PAD (peripheral artery disease) ([place])   . Scalp lesion 04/12/2019  . Substance abuse ([place])   . Transfusion of blood product refused for religious reason   . Type II diabetes mellitus ([place])     Past Surgical History:  Procedure Laterality Date  . ABDOMINAL AORTOGRAM W/LOWER EXTREMITY Bilateral 05/30/2017  . ABDOMINAL AORTOGRAM W/LOWER EXTREMITY N/A 05/30/2017   Procedure: ABDOMINAL AORTOGRAM W/LOWER EXTREMITY;  Surgeon: Elam Dutch, MD;  Location: [facility];  Service: Cardiovascular;  Laterality: N/A;  Bilateral    Current Outpatient Medications  Medication Sig Dispense Refill  . ACCU-CHEK AVIVA PLUS test strip USE AS DIRECTED TO TEST TWICE DAILY AS NEEDED 100 each 0  . amLODipine (NORVASC) 10 MG tablet Take 1 tablet (10 mg total) by mouth daily. 90 tablet 0  . aspirin 325 MG EC tablet Take 1 tablet (325 mg total) by mouth daily. May take an additional 325 mgs as needed for sleep 90 tablet 0  . aspirin-sod bicarb-citric acid (ALKA-SELTZER) 325 MG TBEF tablet Take 325 mg by mouth daily as needed (indigestion).    [name] atorvastatin (LIPITOR) 10 MG tablet Take 1 tablet (10 mg total) by mouth daily. 90 tablet 0  . blood glucose meter kit and supplies KIT Dx   e11.9 1 each 0  . canagliflozin (INVOKANA) 100 MG TABS tablet Take 1  tablet (100 mg total) by mouth daily before breakfast. 90 tablet 0  . Cholecalciferol 1.25 MG (50000 UT) TABS Take 10,000 Units by mouth once a week. (Patient taking differently: Take by mouth once a week. Pt takes 50,000 units a week) 4 tablet 3  . glipiZIDE (GLUCOTROL) 5 MG tablet Take 1 tablet (5 mg total) by mouth 2 (two) times daily before a meal. 60 tablet 3  . losartan (COZAAR) 100 MG tablet Take 1 tablet (100 mg total) by mouth daily. 90 tablet 0  . metFORMIN (GLUCOPHAGE) 500 MG tablet Take 1 tablet (500 mg total) by mouth  daily with breakfast. 180 tablet 3  . Multiple Vitamin (MULTIVITAMIN WITH MINERALS) TABS tablet Take 1 tablet by mouth daily.    . Naphazoline-Glycerin (REDNESS RELIEF OP) Place 1 drop into both eyes daily as needed (redness).    . Omega-3 Fatty Acids (FISH OIL PEARLS) 300 MG CAPS Take 600 mg by mouth 2 (two) times daily. 60 capsule 3  . tadalafil (CIALIS) 20 MG tablet Take 0.5 tablets (10 mg total) by mouth every other day as needed for erectile dysfunction. (Patient taking differently: Take 20 mg by mouth as needed for erectile dysfunction. ) 5 tablet 2  . vitamin C (ASCORBIC ACID) 500 MG tablet Take 500 mg by mouth daily.    [name] omeprazole (PRILOSEC) 20 MG capsule Take 1 capsule (20 mg total) by mouth daily. 30 capsule 5   No current facility-administered medications for this visit.    Allergies as of 10/07/2019 - Review Complete 10/07/2019  Allergen Reaction Noted  . Onion  05/27/2017  . Other  06/07/2019    Family History  Problem Relation Age of Onset  . Alzheimer's disease Mother   . Mental illness Mother        Depression  . Breast cancer Mother   . Cancer Father        bladder?  [name] Hypertension Father   . Alcohol abuse Father   . Heart disease Sister   . Hearing loss Sister        valve  . Alcohol abuse Brother   . Diabetes Paternal Aunt   . COPD Sister   . Alcohol abuse Brother   . Alcohol abuse Brother   . Alcohol abuse Brother   . Early death Brother        MVA  . Cancer Maternal Grandmother   . Colon cancer Neg Hx     Social History   Socioeconomic History  . Marital status: Single    Spouse name: Not on file  . Number of children: 0  . Years of education: 78  . Highest education level: Not on file  Occupational History  . Occupation: retired    Comment: labor  Tobacco Use  . Smoking status: Current Every Day Smoker    Packs/day: 1.00    Years: 57.00    Pack years: 57.00    Types: Cigarettes    Start date: 07/01/1961  . Smokeless tobacco: Never Used   . Tobacco comment: Has tried to quit, but his depressed mood makes it challenging to quit  Substance and Sexual Activity  . Alcohol use: Not Currently    Comment: 06/07/19 hx:"nothing since ~ 2012"; patients states hasn't drank "in a while"  . Drug use: Not Currently    Types: Marijuana, Cocaine, Heroin, "Crack" cocaine    Comment: 05/30/2017 "nothing since ~ 2012"  . Sexual activity: Not Currently  Other Topics Concern  . Not on  file  Social History Narrative   Single.Live in boarding house   Renting a room   looking for apartment   Never had license   Likes to read   Social Determinants of Health   Financial Resource Strain:   . Difficulty of Paying Living Expenses:   Food Insecurity:   . Worried About Charity fundraiser in the Last Year:   . Arboriculturist in the Last Year:   Transportation Needs:   . Film/video editor (Medical):   [name] Lack of Transportation (Non-Medical):   Physical Activity:   . Days of Exercise per Week:   . Minutes of Exercise per Session:   Stress:   . Feeling of Stress :   Social Connections:   . Frequency of Communication with Friends and Family:   . Frequency of Social Gatherings with Friends and Family:   . Attends Religious Services:   . Active Member of Clubs or Organizations:   . Attends Archivist Meetings:   [name] Marital Status:   Intimate Partner Violence:   . Fear of Current or Ex-Partner:   . Emotionally Abused:   [name] Physically Abused:   . Sexually Abused:     Review of Systems: Gen: Denies any fever, chills, lightheadedness, dizziness, presyncope, syncope. Reports feeling tired all the time.  CV: Denies chest pain or heart palpitations Resp: Denies shortness of breath. Occasional cough.   GI: See HPI GU : Denies urinary burning, urinary frequency, urinary hesitancy MS: Denies joint pain Derm: Denies rash Psych: Admits to depression and anxiety Heme: See HPI  Physical Exam: BP 139/75   Pulse (!) 58   Temp (!)  96.8 F (36 C) (Temporal)   Ht '5\' 10"'  (1.778 m)   Wt 233 lb (105.7 kg)   BMI 33.43 kg/m  General:   Alert and oriented. Pleasant and cooperative. Well-nourished and well-developed. Uses a cain.  Head:  Normocephalic and atraumatic. Eyes:  Without icterus, sclera clear Ears:  Normal auditory acuity. Lungs:  Clear to auscultation bilaterally. No wheezes, rales, or rhonchi. No distress.  Heart:  S1, S2 present without murmurs appreciated.  Abdomen:  +BS, soft, non-tender and non-distended. No HSM noted. No guarding or rebound. No masses appreciated.  Rectal:  Deferred  Msk:  Symmetrical without gross deformities. Normal posture. Extremities:  Without edema. Neurologic:  Alert and  oriented x4;  grossly normal neurologically. Skin:  Intact without significant lesions or rashes. Psych: Normal mood and affect.

## 2019-10-06 NOTE — Progress Notes (Signed)
Subjective:  Patient ID: Alex Marks, male    DOB: 10-04-1945  Age: 74 y.o. MRN: [id number]  CC:  Chief Complaint  Patient presents with  . Hypertension  . foot exam  . Diabetes  . Other    Erectile Dysfunction      HPI  This patient comes in today for the above.  Hypertension: He continues on his amlodipine and losartan daily.  Type 2 diabetes: He is due for foot exam today.  We restarted him on Metformin 500 mg daily as last office visit.  He tells me he has been tolerating this fairly well.  He does have diarrhea about every other day.  He is also on glipizide twice a day as well as Invokana daily.  As stated above he is on ARB.  Last A1c was 7.8 on September 02, 2019.  He does have microalbuminuria.  Erectile dysfunction: He also mentions to me that he does have erectile dysfunction and is taking Viagra as needed for this.  He tells me that the Viagra has not been very effective with treating his symptoms.  He tells me he has been on Cialis in the past with more substantial effect.  He denies any history of heart attack or stroke.  He is not taking any nitrates.   Past Medical History:  Diagnosis Date  . Anxiety   . COPD (chronic obstructive pulmonary disease) ([place])   . Depression   . Depression   . Frequency of urination   . GAD (generalized anxiety disorder)   . GERD (gastroesophageal reflux disease)   . Hepatitis C    "was treated; was cured after 3 month" (05/30/2017)  . Hypercholesterolemia   . Hypertension   . PAD (peripheral artery disease) ([place])   . Scalp lesion 04/12/2019  . Substance abuse ([place])   . Transfusion of blood product refused for religious reason   . Type II diabetes mellitus ([facility])       Family History  Problem Relation Age of Onset  . Alzheimer's disease Mother   . Mental illness Mother        Depression  . Breast cancer Mother   . Cancer Father        bladder?  [name] Hypertension Father   . Alcohol abuse Father   . Heart disease  Sister   . Hearing loss Sister        valve  . Alcohol abuse Brother   . Diabetes Paternal Aunt   . COPD Sister   . Alcohol abuse Brother   . Alcohol abuse Brother   . Alcohol abuse Brother   . Early death Brother        MVA  . Cancer Maternal Grandmother     Social History   Social History Narrative   Single.Live in boarding house   Renting a room   looking for apartment   Never had license   Likes to read   Social History   Tobacco Use  . Smoking status: Current Every Day Smoker    Packs/day: 1.00    Years: 57.00    Pack years: 57.00    Types: Cigarettes    Start date: 07/01/1961  . Smokeless tobacco: Never Used  . Tobacco comment: Has tried to quit, but his depressed mood makes it challenging to quit  Substance Use Topics  . Alcohol use: Not Currently    Comment: 06/07/19 hx:"nothing since ~ 2012"; patients states hasn't drank "in a while"  Current Meds  Medication Sig  . ACCU-CHEK AVIVA PLUS test strip USE AS DIRECTED TO TEST TWICE DAILY AS NEEDED  . amLODipine (NORVASC) 10 MG tablet Take 1 tablet (10 mg total) by mouth daily.  [name] aspirin 325 MG EC tablet Take 1 tablet (325 mg total) by mouth daily. May take an additional 325 mgs as needed for sleep  . aspirin-sod bicarb-citric acid (ALKA-SELTZER) 325 MG TBEF tablet Take 325 mg by mouth daily as needed (indigestion).  [name] atorvastatin (LIPITOR) 10 MG tablet Take 1 tablet (10 mg total) by mouth daily.  . blood glucose meter kit and supplies KIT Dx   e11.9  . canagliflozin (INVOKANA) 100 MG TABS tablet Take 1 tablet (100 mg total) by mouth daily before breakfast.  . Cholecalciferol 1.25 MG (50000 UT) TABS Take 10,000 Units by mouth once a week.  [name] glipiZIDE (GLUCOTROL) 5 MG tablet Take 1 tablet (5 mg total) by mouth 2 (two) times daily before a meal.  . losartan (COZAAR) 100 MG tablet Take 1 tablet (100 mg total) by mouth daily.  . Menthol, Topical Analgesic, (ICY HOT EX) Apply 1 application topically daily as  needed (muscle pain).  . metFORMIN (GLUCOPHAGE) 500 MG tablet Take 1 tablet (500 mg total) by mouth daily with breakfast.  . Multiple Vitamin (MULTIVITAMIN WITH MINERALS) TABS tablet Take 1 tablet by mouth daily.  . Naphazoline-Glycerin (REDNESS RELIEF OP) Place 1 drop into both eyes daily as needed (redness).  . Omega-3 Fatty Acids (FISH OIL PEARLS) 300 MG CAPS Take 600 mg by mouth 2 (two) times daily.  . vitamin C (ASCORBIC ACID) 500 MG tablet Take 500 mg by mouth daily.  . [DISCONTINUED] sildenafil (VIAGRA) 100 MG tablet Take 1 tablet (100 mg total) by mouth daily as needed for erectile dysfunction.    ROS:  Review of Systems  Constitutional: Positive for malaise/fatigue. Negative for fever and weight loss.  Eyes: Negative for blurred vision.  Respiratory: Negative for cough, shortness of breath and wheezing.   Cardiovascular: Negative for chest pain and palpitations.       (+) reduced exercise tolerance  Gastrointestinal: Positive for diarrhea.  Genitourinary:       (+) erectile dysfunction  Neurological: Negative for headaches.     Objective:   Today's Vitals: BP (!) 148/82   Pulse 67   Temp (!) 97.3 F (36.3 C) (Temporal)   Ht '5\' 10"'  (1.778 m)   Wt 232 lb 12.8 oz (105.6 kg)   SpO2 97%   BMI 33.40 kg/m  Vitals with BMI 10/06/2019 10/06/2019 09/02/2019  Height - '5\' 10"'  '5\' 10"'   Weight - 232 lbs 13 oz 231 lbs  BMI - 65.6 81.27  Systolic 517 001 749  Diastolic 82 80 80  Pulse - 67 64     Physical Exam Vitals reviewed.  Constitutional:      Appearance: Normal appearance.  HENT:     Head: Normocephalic and atraumatic.  Cardiovascular:     Rate and Rhythm: Normal rate and regular rhythm.     Pulses:          Dorsalis pedis pulses are 2+ on the right side and 2+ on the left side.  Pulmonary:     Effort: Pulmonary effort is normal.     Breath sounds: Normal breath sounds.  Musculoskeletal:     Cervical back: Neck supple.     Right foot: No deformity.     Left foot: No  deformity.  Feet:  Right foot:     Protective Sensation: 10 sites tested. 5 sites sensed.     Skin integrity: Skin integrity normal.     Toenail Condition: Right toenails are normal.     Left foot:     Protective Sensation: 10 sites tested. 7 sites sensed.     Skin integrity: Skin integrity normal.     Toenail Condition: Left toenails are normal.  Skin:    General: Skin is warm and dry.  Neurological:     Mental Status: He is alert and oriented to person, place, and time.  Psychiatric:        Mood and Affect: Mood normal.        Behavior: Behavior normal.        Thought Content: Thought content normal.        Judgment: Judgment normal.          Assessment and Plan   1. Type 2 diabetes mellitus without complication, without long-term current use of insulin ([place])   2. Erectile dysfunction, unspecified erectile dysfunction type   3. Essential hypertension      Plan: 1.  He will continue on his current medications, he will be due for A1c check in approximately 2 months.  He is concerned with weight loss as well, I have given him a sheet with dietary recommendations.  We did briefly discuss the philosophy of intermittent fasting, but I recommended that he hold off on trying any fasting due to his current diabetic medication regimen until he can discuss this further with Dr. Anastasio Champion in approximately 1 month.  He tells me he understands.  For now he will focus on making healthier food choices and increasing his intake of whole foods.  2.  I have told him to stop his Viagra instead I will prescribe Cialis.  I did tell him to take this every other day only as needed.  I did warn him not to combine the 2 medications, he tells me he understands.  3.  Although his blood pressure was a bit elevated initially when he came into the office today it did improve upon recheck.  Per chart review his blood pressure was better at a previous visit, thus he will continue on his current medication  regimen for now.  May need to consider adding additional agent if blood pressure remains elevated.  He was instructed to call 911 if she experiences any chest pain, worsening fatigue, difficulty breathing, weakness or sensation changes, and tells me he understands.   Tests ordered No orders of the defined types were placed in this encounter.     Meds ordered this encounter  Medications  . tadalafil (CIALIS) 20 MG tablet    Sig: Take 0.5 tablets (10 mg total) by mouth every other day as needed for erectile dysfunction.    Dispense:  5 tablet    Refill:  2    Order Specific Question:   Supervising Provider    Answer:   Doree Albee [[id number]]    Patient to follow-up in 1 month.  Ailene Ards, NP

## 2019-10-06 NOTE — Patient Instructions (Signed)
[facility] Dietary Recommendations for Weight Loss What to Avoid . Avoid added sugars o Often added sugar can be found in processed foods such as many condiments, dry cereals, cakes, cookies, chips, crisps, crackers, candies, sweetened drinks, etc.  o Read labels and AVOID/DECREASE use of foods with the following in their ingredient list: Sugar, fructose, high fructose corn syrup, sucrose, glucose, maltose, dextrose, molasses, cane sugar, brown sugar, any type of syrup, agave nectar, etc.   . Avoid snacking in between meals . Avoid foods made with flour o If you are going to eat food made with flour, choose those made with whole-grains; and, minimize your consumption as much as is tolerable . Avoid processed foods o These foods are generally stocked in the middle of the grocery store. Focus on shopping on the perimeter of the grocery.  . Avoid Meat  o We recommend following a plant-based diet at [facility]. Thus, we recommend avoiding meat as a general rule. Consider eating beans, legumes, eggs, and/or dairy products for regular protein sources o If you plan on eating meat limit to 4 ounces of meat at a time and choose lean options such as Fish, chicken, turkey. Avoid red meat intake such as pork and/or steak What to Include . Vegetables o GREEN LEAFY VEGETABLES: Kale, spinach, mustard greens, collard greens, cabbage, broccoli, etc. o OTHER: Asparagus, cauliflower, eggplant, carrots, peas, Brussel sprouts, tomatoes, bell peppers, zucchini, beets, cucumbers, etc. . Grains, seeds, and legumes o Beans: kidney beans, black eyed peas, garbanzo beans, black beans, pinto beans, etc. o Whole, unrefined grains: brown rice, barley, bulgur, oatmeal, etc. . Healthy fats  o Avoid highly processed fats such as vegetable oil o Examples of healthy fats: avocado, olives, virgin olive oil, dark chocolate (?72% Cocoa), nuts (peanuts, almonds, walnuts, cashews, pecans, etc.) . None to Low  Intake of Animal Sources of Protein o Meat sources: chicken, turkey, salmon, tuna. Limit to 4 ounces of meat at one time. o Consider limiting dairy sources, but when choosing dairy focus on: PLAIN Greek yogurt, cottage cheese, high-protein milk . Fruit o Choose berries  When to Eat . Intermittent Fasting: o Choosing not to eat for a specific time period, but DO FOCUS ON HYDRATION when fasting o Multiple Techniques: - Time Restricted Eating: eat 3 meals in a day, each meal lasting no more than 60 minutes, no snacks between meals - 16-18 hour fast: fast for 16 to 18 hours up to 7 days a week. Often suggested to start with 2-3 nonconsecutive days per week.  . Remember the time you sleep is counted as fasting.  . Examples of eating schedule: Fast from 7:00pm-11:00am. Eat between 11:00am-7:00pm.  - 24-hour fast: fast for 24 hours up to every other day. Often suggested to start with 1 day per week . Remember the time you sleep is counted as fasting . Examples of eating schedule:  o Eating day: eat 2-3 meals on your eating day. If doing 2 meals, each meal should last no more than 90 minutes. If doing 3 meals, each meal should last no more than 60 minutes. Finish last meal by 7:00pm. o Fasting day: Fast until 7:00pm.  o IF YOU FEEL UNWELL FOR ANY REASON/IN ANY WAY WHEN FASTING, STOP FASTING BY EATING A NUTRITIOUS SNACK OR LIGHT MEAL o ALWAYS FOCUS ON HYDRATION DURING FASTS - Acceptable Hydration sources: water, broths, tea/coffee (black tea/coffee is best but using a small amount of whole-fat dairy products in coffee/tea is acceptable).  -   Poor Hydration Sources: anything with sugar or artificial sweeteners added to it  These recommendations have been developed for patients that are actively receiving medical care from either Dr. [facility] or Viren Lebeau, DNP, NP-C at [facility]. These recommendations are developed for patients with specific medical conditions and are not meant to be  distributed or used by others that are not actively receiving care from either provider listed above at [facility]. It is not appropriate to participate in the above eating plans without proper medical supervision.   Reference: Fung, J. The obesity code. [place]: [place]; 2016.   

## 2019-10-07 ENCOUNTER — Telehealth: Payer: Self-pay | Admitting: Gastroenterology

## 2019-10-07 ENCOUNTER — Encounter: Payer: Self-pay | Admitting: *Deleted

## 2019-10-07 ENCOUNTER — Encounter: Payer: Self-pay | Admitting: Gastroenterology

## 2019-10-07 ENCOUNTER — Telehealth: Payer: Self-pay | Admitting: *Deleted

## 2019-10-07 ENCOUNTER — Ambulatory Visit (INDEPENDENT_AMBULATORY_CARE_PROVIDER_SITE_OTHER): Payer: Medicare HMO | Admitting: Gastroenterology

## 2019-10-07 VITALS — BP 139/75 | HR 58 | Temp 96.8°F | Ht 70.0 in | Wt 233.0 lb

## 2019-10-07 DIAGNOSIS — R195 Other fecal abnormalities: Secondary | ICD-10-CM | POA: Insufficient documentation

## 2019-10-07 DIAGNOSIS — K7400 Hepatic fibrosis, unspecified: Secondary | ICD-10-CM

## 2019-10-07 DIAGNOSIS — R5383 Other fatigue: Secondary | ICD-10-CM | POA: Diagnosis not present

## 2019-10-07 DIAGNOSIS — Z8619 Personal history of other infectious and parasitic diseases: Secondary | ICD-10-CM | POA: Diagnosis not present

## 2019-10-07 DIAGNOSIS — Z791 Long term (current) use of non-steroidal anti-inflammatories (NSAID): Secondary | ICD-10-CM

## 2019-10-07 LAB — CBC WITH DIFFERENTIAL/PLATELET
Absolute Monocytes: 956 cells/uL — ABNORMAL HIGH (ref 200–950)
Basophils Absolute: 35 cells/uL (ref 0–200)
Basophils Relative: 0.3 %
Eosinophils Absolute: 307 cells/uL (ref 15–500)
Eosinophils Relative: 2.6 %
HCT: 48.4 % (ref 38.5–50.0)
Hemoglobin: 16.4 g/dL (ref 13.2–17.1)
Lymphs Abs: 3528 cells/uL (ref 850–3900)
MCH: 29.5 pg (ref 27.0–33.0)
MCHC: 33.9 g/dL (ref 32.0–36.0)
MCV: 87.2 fL (ref 80.0–100.0)
MPV: 11.1 fL (ref 7.5–12.5)
Monocytes Relative: 8.1 %
Neutro Abs: 6974 cells/uL (ref 1500–7800)
Neutrophils Relative %: 59.1 %
Platelets: 241 10*3/uL (ref 140–400)
RBC: 5.55 10*6/uL (ref 4.20–5.80)
RDW: 13.2 % (ref 11.0–15.0)
Total Lymphocyte: 29.9 %
WBC: 11.8 10*3/uL — ABNORMAL HIGH (ref 3.8–10.8)

## 2019-10-07 MED ORDER — OMEPRAZOLE 20 MG PO CPDR: 20.0000 mg | DELAYED_RELEASE_CAPSULE | Freq: Every day | ORAL | 5 refills | Status: DC

## 2019-10-07 NOTE — Assessment & Plan Note (Addendum)
74 year old male with history significant for diabetes, PAD, anxiety, depression, COPD, Hep C s/p treatment in 2017, and substance abuse presenting due to positive fit test on 09/08/2019.  No prior colonoscopy.   No other significant upper or lower GI symptoms. Denies bright red blood per rectum, melena, or unintentional weight loss.  He does report increased fatigue and feeling tired all the time. He has been taking 325 mg aspirin daily and Alka-Seltzer nightly for many years.  No PPI.  Last hemoglobin 17.6 and September 2020.  No family history of colon cancer.  Patient needs colonoscopy for further evaluation of positive fit test.  Additionally, with report of increased fatigue and in light of daily NSAID use, will update CBC to ensure he is not developing any anemia.  Also planing to start low-dose PPI due to long-term NSAID use as discussed above.   Proceed with TCS with propofol with Dr. Benard Rink in the near future. The risks, benefits, and alternatives have been discussed in detail with patient. They have stated understanding and desire to proceed.  UDS at preop due to history of drug use. See separate instructions for diabetes medication adjustments.  Update CBC. Follow-up after colonoscopy.

## 2019-10-07 NOTE — Assessment & Plan Note (Signed)
74 y.o. male with history of Hepatitis C s/p treatment in 2017 by Dr. Dulce Sellar. HCV RNA 1,160,000 in May 2017. HCV RNA not detected in September 2017. Ultrasound abdomen complete with elastography 12/07/2015.  Liver appeared within normal limits and parenchymal echogenicity, no focal liver lesion. Metavir fibrosis score: F2/F3.  With this fibrosis score, he should be receiving serial ultrasounds. Denies any alcohol or drug use in the last 10 years. No signs or symptoms of advanced liver disease.   Update right upper quadrant ultrasound. Based on review of labs, it is unclear to me that patient ever had HCVRNA checked 12 weeks post treatment to ensure SVR. Will have nursing staff arrange for patient to complete HCV RNA quantitative as I reviewed labs after patient left the office.  Follow-up after colonoscopy.

## 2019-10-07 NOTE — Progress Notes (Signed)
Hemoglobin within normal limits at 16.4. This is down from 17.6, 6 months ago, but within his baseline fluctuations. He does have mild elevation of his WBC at 11.8. this seems to be a chronic findings. He was without signs or symptoms of infection when I saw him. Recommend he follow-up with PCP on this.

## 2019-10-07 NOTE — Telephone Encounter (Signed)
Alex Marks, please let patient know I have reviewed his Hep C labs from 2017. It doesn't appear he ever had the RNA level checked 12 weeks post treatment. I would like for him to have this completed so we can have it on file and ensure Hep C is completely eradicated.   Please arrange Hep C RNA Quantitative. Dx: History of Hep C.

## 2019-10-07 NOTE — Assessment & Plan Note (Addendum)
Patient reports taking 325 mg aspirin daily and Alka-Seltzer with aspirin every night for many years.  Aspirin is for PAD.  He reports taking Alka-Seltzer to prevent a cold.  No significant upper GI symptoms.  Denies bright red blood per rectum or melena.  Recently had positive fit test as discussed below.  Considering long-term NSAID use with high dosage of aspirin, I will start him on omeprazole 20 mg daily to protect his stomach from gastritis and PUD.  He was advised to discontinue nightly Alka-Seltzer.

## 2019-10-07 NOTE — Assessment & Plan Note (Addendum)
Patient has history of hepatitis C in 2017 s/p treatment by Dr. Dulce Sellar.  Ultrasound at that time with liver appearing within normal limits without focal lesions but Meravir fibrosis score was F2/F3.  With this fibrosis score, he should be receiving serial ultrasounds.  He denies any alcohol or drug use in the last 10 years.  No signs or symptoms of advanced liver disease.  LFTs with LFTs within normal limits in March 2021.  Update right upper quadrant ultrasound.  He will likely need these every 6 months.

## 2019-10-07 NOTE — Patient Instructions (Signed)
We will get you scheduled for colonoscopy in the near future. You have a urine drug screen at your preop labs. 1 day prior to procedure: Take 1/2 tablet Invokana (50 mg), 1/2 tablet glipizide (2.5 mg) 2 times daily, 1/2 tablet Metformin (250 mg) daily. Day of procedure: Do not take your morning diabetes medications.  Please have ultrasound completed.  This is to check your liver.  Please have labs completed.  This is to check your hemoglobin and ensure you are not becoming anemic.  You may continue your daily aspirin. Stop using Alka-Seltzer in the evenings.  I am sending in omeprazole 20 mg daily.  This is to protect her stomach from her daily aspirin use.  Take this every day 30 minutes before breakfast.  We will follow up with you after your procedure.  Call with questions or concerns prior.  Ermalinda Memos, PA-C [facility]

## 2019-10-07 NOTE — Telephone Encounter (Signed)
Called pt. Procedure appointment scheduled for 6/1 at 7:30am. Patient aware will mail pre-op/covd test/prep instructions to him. Confirmed mailing address is correct in chart.   PA approved via [employer] for TCS. Auth# [id number] dates 11/30/2019-12/30/2019

## 2019-10-08 ENCOUNTER — Other Ambulatory Visit: Payer: Self-pay | Admitting: Emergency Medicine

## 2019-10-08 ENCOUNTER — Other Ambulatory Visit (INDEPENDENT_AMBULATORY_CARE_PROVIDER_SITE_OTHER): Payer: Self-pay | Admitting: Internal Medicine

## 2019-10-08 DIAGNOSIS — Z8619 Personal history of other infectious and parasitic diseases: Secondary | ICD-10-CM | POA: Diagnosis not present

## 2019-10-08 NOTE — Telephone Encounter (Signed)
Notified pt of labs he stated he will do it on Monday morning at [facility]

## 2019-10-08 NOTE — Progress Notes (Signed)
Cc'ed to pcp °

## 2019-10-08 NOTE — Telephone Encounter (Signed)
Pt and verified name and dob let patient know I have reviewed his Hep C labs from 2017. It doesn't appear he ever had the RNA level checked 12 weeks post treatment. I would like for him to have this completed so we can have it on file and ensure Hep C is completely eradicated. Pt stated he understood and that he would. He also stated he will follow up with his pcp about lab work, who is a [facility] of [facility]     arranged  Hep C RNA Quantitative. Dx: History of Hep C

## 2019-10-11 ENCOUNTER — Telehealth: Payer: Self-pay | Admitting: Internal Medicine

## 2019-10-11 ENCOUNTER — Other Ambulatory Visit (INDEPENDENT_AMBULATORY_CARE_PROVIDER_SITE_OTHER): Payer: Self-pay | Admitting: Internal Medicine

## 2019-10-11 DIAGNOSIS — E119 Type 2 diabetes mellitus without complications: Secondary | ICD-10-CM

## 2019-10-11 NOTE — Telephone Encounter (Signed)
Spoke with pt. Pt was notified of his results.

## 2019-10-11 NOTE — Telephone Encounter (Signed)
Pt was calling to let us know he had his labs done on 4/8 and he was having his [place] tomorrow. I told him after the results get here someone will call him with results and it'll take 5 business days. [phone]

## 2019-10-12 ENCOUNTER — Ambulatory Visit (HOSPITAL_COMMUNITY)
Admission: RE | Admit: 2019-10-12 | Discharge: 2019-10-12 | Disposition: A | Discharge status: Home / Self Care | Payer: Medicare HMO | Source: Ambulatory Visit | Attending: Gastroenterology | Admitting: Gastroenterology

## 2019-10-12 ENCOUNTER — Other Ambulatory Visit: Payer: Self-pay

## 2019-10-12 DIAGNOSIS — K7400 Hepatic fibrosis, unspecified: Secondary | ICD-10-CM | POA: Insufficient documentation

## 2019-10-12 DIAGNOSIS — K7689 Other specified diseases of liver: Secondary | ICD-10-CM | POA: Diagnosis not present

## 2019-10-12 NOTE — Progress Notes (Signed)
[place] with new nodularity of the liver. No focal liver lesions. Findings are suggestive of cirrhosis which I suspect is likely secondary to history of Hep C.   We are waiting on Hep C RNA to result. I do not see that it is in process although patient reports having this completed on Monday.   In addition, as patient likely has cirrhosis, he needs to have INR and AFP checked.   He should continue to avoid alcohol, limit tylenol to no more than 2 g daily, and follow a low sodium diet (no more than 2 g daily). We will discuss ongoing cirrhosis care at his follow-up.

## 2019-10-13 ENCOUNTER — Other Ambulatory Visit: Payer: Self-pay

## 2019-10-13 DIAGNOSIS — Z8619 Personal history of other infectious and parasitic diseases: Secondary | ICD-10-CM

## 2019-10-13 DIAGNOSIS — Z79899 Other long term (current) drug therapy: Secondary | ICD-10-CM

## 2019-10-13 LAB — HEPATITIS C RNA QUANTITATIVE
HCV Quantitative Log: <1.18 Log IU/mL
HCV RNA, PCR, QN: <15 IU/mL

## 2019-10-13 NOTE — Progress Notes (Signed)
HCV RNA not detected.   See prior US result note. Findings suggestive of cirrhosis. He needs INR and AFP completed. Please arrange. Otherwise, we will follow-up in office after TCS.

## 2019-10-14 ENCOUNTER — Telehealth: Payer: Self-pay | Admitting: Internal Medicine

## 2019-10-14 DIAGNOSIS — Z79899 Other long term (current) drug therapy: Secondary | ICD-10-CM | POA: Diagnosis not present

## 2019-10-14 DIAGNOSIS — B1920 Unspecified viral hepatitis C without hepatic coma: Secondary | ICD-10-CM | POA: Diagnosis not present

## 2019-10-14 DIAGNOSIS — Z8619 Personal history of other infectious and parasitic diseases: Secondary | ICD-10-CM | POA: Diagnosis not present

## 2019-10-14 DIAGNOSIS — K7400 Hepatic fibrosis, unspecified: Secondary | ICD-10-CM | POA: Diagnosis not present

## 2019-10-14 NOTE — Telephone Encounter (Signed)
[phone]  Please call patient, he does not understand his instructions and does not know if he will be able to do all that is listed

## 2019-10-14 NOTE — Telephone Encounter (Signed)
Called pt, he didn't understand why he had to do TCS because he done "test at home". Reminded him the test was positive. Explained reason for doing TCS. Pt better understood. He doesn't have anyone to take him to procedure. Informed him he will not be able to have TCS if he doesn't have a driver. He will see what he can do and call office later if he needs to cancel.

## 2019-10-15 LAB — PROTIME-INR
INR: 1
Prothrombin Time: 10.3 s (ref 9.0–11.5)

## 2019-10-15 LAB — AFP TUMOR MARKER: AFP-Tumor Marker: 5.7 ng/mL (ref <6.1)

## 2019-10-19 ENCOUNTER — Telehealth (INDEPENDENT_AMBULATORY_CARE_PROVIDER_SITE_OTHER): Payer: Self-pay | Admitting: Nurse Practitioner

## 2019-10-19 DIAGNOSIS — Z1211 Encounter for screening for malignant neoplasm of colon: Secondary | ICD-10-CM

## 2019-10-21 ENCOUNTER — Telehealth (INDEPENDENT_AMBULATORY_CARE_PROVIDER_SITE_OTHER): Payer: Self-pay | Admitting: Internal Medicine

## 2019-10-21 NOTE — Telephone Encounter (Signed)
Please call the patient and tell him that in addition to the eyedrops and nasal spray, he should be taking over-the-counter allergy medicine such as Claritin, Allegra or Zyrtec.  If he decides to take Zyrtec, this can cause drowsiness so be sure to take that at night. If these things do not help, he needs to come in the office next week to be seen.

## 2019-10-21 NOTE — Telephone Encounter (Signed)
Mr. Alex Marks came in the office and Nellie gave him the instructions

## 2019-10-22 NOTE — Telephone Encounter (Signed)
I think that is a question to ask a gastroenterologist. I would recommend speaking to them about whether he needs the colonoscopy or not. I will order referral to GI to discuss.

## 2019-11-10 ENCOUNTER — Ambulatory Visit (INDEPENDENT_AMBULATORY_CARE_PROVIDER_SITE_OTHER): Payer: Medicare HMO | Admitting: Internal Medicine

## 2019-11-10 ENCOUNTER — Other Ambulatory Visit: Payer: Self-pay

## 2019-11-10 ENCOUNTER — Other Ambulatory Visit (INDEPENDENT_AMBULATORY_CARE_PROVIDER_SITE_OTHER): Payer: Self-pay | Admitting: Internal Medicine

## 2019-11-10 ENCOUNTER — Encounter (INDEPENDENT_AMBULATORY_CARE_PROVIDER_SITE_OTHER): Payer: Self-pay | Admitting: Internal Medicine

## 2019-11-10 VITALS — BP 132/80 | HR 71 | Temp 97.4°F | Resp 19 | Ht 70.0 in | Wt 230.0 lb

## 2019-11-10 DIAGNOSIS — I10 Essential (primary) hypertension: Secondary | ICD-10-CM | POA: Diagnosis not present

## 2019-11-10 DIAGNOSIS — E119 Type 2 diabetes mellitus without complications: Secondary | ICD-10-CM

## 2019-11-10 DIAGNOSIS — N529 Male erectile dysfunction, unspecified: Secondary | ICD-10-CM | POA: Diagnosis not present

## 2019-11-10 NOTE — Progress Notes (Signed)
Metrics: Intervention Frequency ACO  Documented Smoking Status Yearly  Screened one or more times in 24 months  Cessation Counseling or  Active cessation medication Past 24 months  Past 24 months   Guideline developer: UpToDate (See UpToDate for funding source) Date Released: 2014       Wellness Office Visit  Subjective:  Patient ID: Alex Marks, male    DOB: [birth year]  Age: 74 y.o. MRN: [id number]  CC: This man comes in for follow-up of diabetes, hypertension, hyperlipidemia and erectile dysfunction. HPI  He is concerned about his diabetes and erectile dysfunction.  He is on several medications for his diabetes.  He tells me that he drinks 1 bottle of Coca-Cola which is 2 L every single day. Past Medical History:  Diagnosis Date  . Anxiety   . COPD (chronic obstructive pulmonary disease) ([place])   . Depression   . Depression   . Frequency of urination   . GAD (generalized anxiety disorder)   . GERD (gastroesophageal reflux disease)   . Hepatitis C    "was treated; was cured after 3 month" (05/30/2017)  . Hypercholesterolemia   . Hypertension   . PAD (peripheral artery disease) ([place])   . Scalp lesion 04/12/2019  . Substance abuse ([place])   . Transfusion of blood product refused for religious reason   . Type II diabetes mellitus ([facility])       Family History  Problem Relation Age of Onset  . Alzheimer's disease Mother   . Mental illness Mother        Depression  . Breast cancer Mother   . Cancer Father        bladder?  [name] Hypertension Father   . Alcohol abuse Father   . Heart disease Sister   . Hearing loss Sister        valve  . Alcohol abuse Brother   . Diabetes Paternal Aunt   . COPD Sister   . Alcohol abuse Brother   . Alcohol abuse Brother   . Alcohol abuse Brother   . Early death Brother        MVA  . Cancer Maternal Grandmother   . Colon cancer Neg Hx     Social History   Social History Narrative   Single.Live in boarding house   Renting a room    looking for apartment   Never had license   Likes to read   Social History   Tobacco Use  . Smoking status: Current Every Day Smoker    Packs/day: 1.00    Years: 57.00    Pack years: 57.00    Types: Cigarettes    Start date: 07/01/1961  . Smokeless tobacco: Never Used  . Tobacco comment: Has tried to quit, but his depressed mood makes it challenging to quit  Substance Use Topics  . Alcohol use: Not Currently    Comment: 06/07/19 hx:"nothing since ~ 2012"; patients states hasn't drank "in a while"    Current Meds  Medication Sig  . ACCU-CHEK AVIVA PLUS test strip CHECK BLOOD SUGAR EVERY DAY  . amLODipine (NORVASC) 10 MG tablet TAKE 1 TABLET(10 MG) BY MOUTH DAILY  . aspirin 325 MG EC tablet Take 1 tablet (325 mg total) by mouth daily. May take an additional 325 mgs as needed for sleep  . atorvastatin (LIPITOR) 10 MG tablet TAKE 1 TABLET(10 MG) BY MOUTH DAILY  . blood glucose meter kit and supplies KIT Dx   e11.9  . Cholecalciferol 1.25 MG (50000 UT)  TABS Take 10,000 Units by mouth once a week. (Patient taking differently: Take by mouth once a week. Pt takes 50,000 units a week)  . glipiZIDE (GLUCOTROL) 5 MG tablet Take 1 tablet (5 mg total) by mouth 2 (two) times daily before a meal.  . INVOKANA 100 MG TABS tablet TAKE 1 TABLET(100 MG) BY MOUTH DAILY BEFORE BREAKFAST  . losartan (COZAAR) 100 MG tablet TAKE 1 TABLET(100 MG) BY MOUTH DAILY  . metFORMIN (GLUCOPHAGE) 500 MG tablet Take 1 tablet (500 mg total) by mouth daily with breakfast.  . Multiple Vitamin (MULTIVITAMIN WITH MINERALS) TABS tablet Take 1 tablet by mouth daily.  . Naphazoline-Glycerin (REDNESS RELIEF OP) Place 1 drop into both eyes daily as needed (redness).  . Omega-3 Fatty Acids (FISH OIL PEARLS) 300 MG CAPS Take 600 mg by mouth 2 (two) times daily.  [name] omeprazole (PRILOSEC) 20 MG capsule Take 1 capsule (20 mg total) by mouth daily.  . tadalafil (CIALIS) 20 MG tablet Take 0.5 tablets (10 mg total) by mouth every other  day as needed for erectile dysfunction. (Patient taking differently: Take 20 mg by mouth as needed for erectile dysfunction. )  . vitamin C (ASCORBIC ACID) 500 MG tablet Take 500 mg by mouth daily.       Objective:   Today's Vitals: BP 132/80 (BP Location: Left Arm, Patient Position: Sitting, Cuff Size: Normal)   Pulse 71   Temp (!) 97.4 F (36.3 C) (Temporal)   Resp 19   Ht '5\' 10"'  (1.778 m)   Wt 230 lb (104.3 kg)   SpO2 94%   BMI 33.00 kg/m  Vitals with BMI 11/10/2019 10/07/2019 10/06/2019  Height '5\' 10"'  '5\' 10"'  -  Weight 230 lbs 233 lbs -  BMI 33 87.86 -  Systolic 767 209 470  Diastolic 80 75 82  Pulse 71 58 -     Physical Exam   He remains obese.  He has lost about 3 pounds in weight since the last time.  Blood pressure is reasonable for his age.    Assessment   1. Type 2 diabetes mellitus without complication, without long-term current use of insulin ([place])   2. Essential hypertension   3. Erectile dysfunction, unspecified erectile dysfunction type       Tests ordered No orders of the defined types were placed in this encounter.    Plan: 1. We discussed his diabetes in more detail today and I told him about intermittent fasting and if he can do this for 16 hours every day that would be beneficial for him which would mean delaying his breakfast till later.  Also, I stressed to him the importance of eliminating 2 L Coca-Cola every day completely.  He can replace this with water and coffee. 2. I will see him in 1 month for close monitoring and see how he is doing with eliminating Coca-Cola from his diet. 3. I spent 30 minutes with this patient face-to-face as well as reviewing his previous records regarding his control of diabetes.   No orders of the defined types were placed in this encounter.   Doree Albee, MD

## 2019-11-13 ENCOUNTER — Other Ambulatory Visit (INDEPENDENT_AMBULATORY_CARE_PROVIDER_SITE_OTHER): Payer: Self-pay | Admitting: Internal Medicine

## 2019-11-16 ENCOUNTER — Other Ambulatory Visit: Payer: Self-pay

## 2019-11-16 ENCOUNTER — Emergency Department (HOSPITAL_COMMUNITY): Payer: Medicare HMO

## 2019-11-16 ENCOUNTER — Emergency Department (HOSPITAL_COMMUNITY)
Admission: EM | Admit: 2019-11-16 | Discharge: 2019-11-16 | Disposition: A | Discharge status: Home / Self Care | Payer: Medicare HMO | Attending: Emergency Medicine | Admitting: Emergency Medicine

## 2019-11-16 ENCOUNTER — Encounter (HOSPITAL_COMMUNITY): Payer: Self-pay | Admitting: Emergency Medicine

## 2019-11-16 DIAGNOSIS — Z7982 Long term (current) use of aspirin: Secondary | ICD-10-CM | POA: Diagnosis not present

## 2019-11-16 DIAGNOSIS — J449 Chronic obstructive pulmonary disease, unspecified: Secondary | ICD-10-CM | POA: Diagnosis not present

## 2019-11-16 DIAGNOSIS — I10 Essential (primary) hypertension: Secondary | ICD-10-CM | POA: Insufficient documentation

## 2019-11-16 DIAGNOSIS — R1031 Right lower quadrant pain: Secondary | ICD-10-CM | POA: Diagnosis present

## 2019-11-16 DIAGNOSIS — N2889 Other specified disorders of kidney and ureter: Secondary | ICD-10-CM | POA: Diagnosis not present

## 2019-11-16 DIAGNOSIS — R319 Hematuria, unspecified: Secondary | ICD-10-CM | POA: Insufficient documentation

## 2019-11-16 DIAGNOSIS — Z7984 Long term (current) use of oral hypoglycemic drugs: Secondary | ICD-10-CM | POA: Diagnosis not present

## 2019-11-16 DIAGNOSIS — F17210 Nicotine dependence, cigarettes, uncomplicated: Secondary | ICD-10-CM | POA: Diagnosis not present

## 2019-11-16 DIAGNOSIS — R1084 Generalized abdominal pain: Secondary | ICD-10-CM | POA: Diagnosis not present

## 2019-11-16 DIAGNOSIS — R52 Pain, unspecified: Secondary | ICD-10-CM | POA: Diagnosis not present

## 2019-11-16 DIAGNOSIS — E119 Type 2 diabetes mellitus without complications: Secondary | ICD-10-CM | POA: Insufficient documentation

## 2019-11-16 DIAGNOSIS — N200 Calculus of kidney: Secondary | ICD-10-CM | POA: Diagnosis not present

## 2019-11-16 DIAGNOSIS — R109 Unspecified abdominal pain: Secondary | ICD-10-CM | POA: Diagnosis not present

## 2019-11-16 LAB — CBC WITH DIFFERENTIAL/PLATELET
Abs Immature Granulocytes: 0.09 10*3/uL — ABNORMAL HIGH (ref 0.00–0.07)
Abs Immature Granulocytes: 0.07 10*3/uL (ref 0.00–0.07)
Basophils Absolute: 0 10*3/uL (ref 0.0–0.1)
Basophils Absolute: 0 10*3/uL (ref 0.0–0.1)
Basophils Relative: 0 %
Basophils Relative: 0 %
Eosinophils Absolute: 0.1 10*3/uL (ref 0.0–0.5)
Eosinophils Absolute: 0 10*3/uL (ref 0.0–0.5)
Eosinophils Relative: 1 %
Eosinophils Relative: 0 %
HCT: 46.3 % (ref 39.0–52.0)
HCT: 44.5 % (ref 39.0–52.0)
Hemoglobin: 15.5 g/dL (ref 13.0–17.0)
Hemoglobin: 14.9 g/dL (ref 13.0–17.0)
Immature Granulocytes: 1 %
Immature Granulocytes: 0 %
Lymphocytes Relative: 11 %
Lymphocytes Relative: 8 %
Lymphs Abs: 1.9 10*3/uL (ref 0.7–4.0)
Lymphs Abs: 1.3 10*3/uL (ref 0.7–4.0)
MCH: 29.3 pg (ref 26.0–34.0)
MCH: 29.6 pg (ref 26.0–34.0)
MCHC: 33.5 g/dL (ref 30.0–36.0)
MCHC: 33.5 g/dL (ref 30.0–36.0)
MCV: 87.5 fL (ref 80.0–100.0)
MCV: 88.3 fL (ref 80.0–100.0)
Monocytes Absolute: 1.3 10*3/uL — ABNORMAL HIGH (ref 0.1–1.0)
Monocytes Absolute: 1 10*3/uL (ref 0.1–1.0)
Monocytes Relative: 7 %
Monocytes Relative: 6 %
Neutro Abs: 14 10*3/uL — ABNORMAL HIGH (ref 1.7–7.7)
Neutro Abs: 13.2 10*3/uL — ABNORMAL HIGH (ref 1.7–7.7)
Neutrophils Relative %: 80 %
Neutrophils Relative %: 86 %
Platelets: 264 10*3/uL (ref 150–400)
Platelets: 251 10*3/uL (ref 150–400)
RBC: 5.29 MIL/uL (ref 4.22–5.81)
RBC: 5.04 MIL/uL (ref 4.22–5.81)
RDW: 13.3 % (ref 11.5–15.5)
RDW: 13.6 % (ref 11.5–15.5)
WBC: 17.5 10*3/uL — ABNORMAL HIGH (ref 4.0–10.5)
WBC: 15.6 10*3/uL — ABNORMAL HIGH (ref 4.0–10.5)
nRBC: 0 % (ref 0.0–0.2)
nRBC: 0 % (ref 0.0–0.2)

## 2019-11-16 LAB — URINALYSIS, ROUTINE W REFLEX MICROSCOPIC
Bacteria, UA: NONE SEEN
Bilirubin Urine: NEGATIVE
Glucose, UA: >=500 mg/dL — AB
Ketones, ur: NEGATIVE mg/dL
Leukocytes,Ua: NEGATIVE
Nitrite: NEGATIVE
Protein, ur: 100 mg/dL — AB
Specific Gravity, Urine: 1.016 (ref 1.005–1.030)
pH: 5 (ref 5.0–8.0)

## 2019-11-16 LAB — COMPREHENSIVE METABOLIC PANEL
ALT: 22 U/L (ref 0–44)
AST: 23 U/L (ref 15–41)
Albumin: 3.9 g/dL (ref 3.5–5.0)
Alkaline Phosphatase: 112 U/L (ref 38–126)
Anion gap: 11 (ref 5–15)
BUN: 10 mg/dL (ref 8–23)
CO2: 22 mmol/L (ref 22–32)
Calcium: 9 mg/dL (ref 8.9–10.3)
Chloride: 106 mmol/L (ref 98–111)
Creatinine, Ser: 0.98 mg/dL (ref 0.61–1.24)
GFR calc Af Amer: >60 mL/min (ref >60)
GFR calc non Af Amer: >60 mL/min (ref >60)
Glucose, Bld: 173 mg/dL — ABNORMAL HIGH (ref 70–99)
Potassium: 3.3 mmol/L — ABNORMAL LOW (ref 3.5–5.1)
Sodium: 139 mmol/L (ref 135–145)
Total Bilirubin: 0.6 mg/dL (ref 0.3–1.2)
Total Protein: 7.7 g/dL (ref 6.5–8.1)

## 2019-11-16 LAB — LIPASE, BLOOD: Lipase: 21 U/L (ref 11–51)

## 2019-11-16 MED ORDER — KETOROLAC TROMETHAMINE 30 MG/ML IJ SOLN
15.0000 mg | Freq: Once | INTRAMUSCULAR | Status: AC
  Administered 2019-11-16: 15 mg via INTRAVENOUS
  Filled 2019-11-16: qty 1

## 2019-11-16 MED ORDER — ONDANSETRON HCL 4 MG/2ML IJ SOLN
4.0000 mg | Freq: Once | INTRAMUSCULAR | Status: AC
  Administered 2019-11-16: 4 mg via INTRAVENOUS
  Filled 2019-11-16: qty 2

## 2019-11-16 MED ORDER — IOHEXOL 300 MG/ML  SOLN
100.0000 mL | Freq: Once | INTRAMUSCULAR | Status: AC | PRN
  Administered 2019-11-16: 100 mL via INTRAVENOUS

## 2019-11-16 MED ORDER — SODIUM CHLORIDE 0.9 % IV BOLUS (SEPSIS)
1000.0000 mL | Freq: Once | INTRAVENOUS | Status: AC
  Administered 2019-11-16: 1000 mL via INTRAVENOUS

## 2019-11-16 MED ORDER — HYDROCODONE-ACETAMINOPHEN 5-325 MG PO TABS
1.0000 | ORAL_TABLET | Freq: Once | ORAL | Status: AC
  Administered 2019-11-16: 1 via ORAL
  Filled 2019-11-16: qty 1

## 2019-11-16 NOTE — ED Provider Notes (Signed)
[facility] EMERGENCY DEPARTMENT Provider Note   CSN: [id number] Arrival date & time: 11/16/19  1849     History Chief Complaint  Patient presents with  . Abdominal Pain    Alex Marks is a 74 y.o. male.  Patient is a 74 year old gentleman past medical history of anxiety, hypertension, diabetes, substance abuse, COPD presenting to the emergency department for acute onset of right flank and abdominal pain which started about 2 hours prior to arrival.  Reports that he was just sitting and watching TV when the pain came on suddenly and was associated with nausea and vomiting.  No exacerbating or relieving factors.  Reports that he has not been urinating as he usually is the last couple of days.  Denies pain with urination, hematuria.  Denies any fever, chills, diarrhea.  No history of kidney stones.        Past Medical History:  Diagnosis Date  . Anxiety   . COPD (chronic obstructive pulmonary disease) ([place])   . Depression   . Depression   . Frequency of urination   . GAD (generalized anxiety disorder)   . GERD (gastroesophageal reflux disease)   . Hepatitis C    "was treated; was cured after 3 month" (05/30/2017)  . Hypercholesterolemia   . Hypertension   . PAD (peripheral artery disease) ([place])   . Scalp lesion 04/12/2019  . Substance abuse ([place])   . Transfusion of blood product refused for religious reason   . Type II diabetes mellitus [facility])     Patient Active Problem List   Diagnosis Date Noted  . NSAID long-term use 10/07/2019  . Positive FIT (fecal immunochemical test) 10/07/2019  . Hepatic fibrosis 10/07/2019  . Depression 09/02/2019  . Type 2 diabetes mellitus ([place]) 07/08/2019  . Screen for colon cancer 07/08/2019  . Encounter for screening for lung cancer 07/08/2019  . Dark stools 07/08/2019  . Advanced care planning/counseling discussion 07/08/2019  . Scalp lesion 04/12/2019  . PAD (peripheral artery disease) ([place]) 05/30/2017  . PVD (peripheral  vascular disease) ([place]) 05/30/2017  . History of hepatitis C 11/11/2016  . History of smoking 30 or more pack years 11/11/2016  . Alcohol abuse 10/08/2016  . Substance abuse ([place]) 10/08/2016  . Tobacco abuse 10/08/2016  . GAD (generalized anxiety disorder) 10/08/2016  . HLD (hyperlipidemia) 10/08/2016  . Essential hypertension 10/08/2016  . ED (erectile dysfunction) 10/08/2016    Past Surgical History:  Procedure Laterality Date  . ABDOMINAL AORTOGRAM W/LOWER EXTREMITY Bilateral 05/30/2017  . ABDOMINAL AORTOGRAM W/LOWER EXTREMITY N/A 05/30/2017   Procedure: ABDOMINAL AORTOGRAM W/LOWER EXTREMITY;  Surgeon: Elam Dutch, MD;  Location: [facility];  Service: Cardiovascular;  Laterality: N/A;  Bilateral       Family History  Problem Relation Age of Onset  . Alzheimer's disease Mother   . Mental illness Mother        Depression  . Breast cancer Mother   . Cancer Father        bladder?  [name] Hypertension Father   . Alcohol abuse Father   . Heart disease Sister   . Hearing loss Sister        valve  . Alcohol abuse Brother   . Diabetes Paternal Aunt   . COPD Sister   . Alcohol abuse Brother   . Alcohol abuse Brother   . Alcohol abuse Brother   . Early death Brother        MVA  . Cancer Maternal Grandmother   .  Colon cancer Neg Hx     Social History   Tobacco Use  . Smoking status: Current Every Day Smoker    Packs/day: 1.00    Years: 57.00    Pack years: 57.00    Types: Cigarettes    Start date: 07/01/1961  . Smokeless tobacco: Never Used  . Tobacco comment: Has tried to quit, but his depressed mood makes it challenging to quit  Substance Use Topics  . Alcohol use: Not Currently    Comment: 06/07/19 hx:"nothing since ~ 2012"; patients states hasn't drank "in a while"  . Drug use: Not Currently    Types: Marijuana, Cocaine, Heroin, "Crack" cocaine    Comment: 05/30/2017 "nothing since ~ 2012"    Home Medications Prior to Admission medications     Medication Sig Start Date End Date Taking? Authorizing Provider  ACCU-CHEK AVIVA PLUS test strip CHECK BLOOD SUGAR EVERY DAY 11/10/19 02/08/20  Hurshel Party C, MD  amLODipine (NORVASC) 10 MG tablet TAKE 1 TABLET(10 MG) BY MOUTH DAILY Patient taking differently: Take 10 mg by mouth daily.  10/10/19   Ailene Ards, NP  aspirin 325 MG EC tablet Take 1 tablet (325 mg total) by mouth daily. May take an additional 325 mgs as needed for sleep Patient taking differently: Take 325 mg by mouth daily.  07/15/19   Doree Albee, MD  atorvastatin (LIPITOR) 10 MG tablet TAKE 1 TABLET(10 MG) BY MOUTH DAILY Patient taking differently: Take 10 mg by mouth daily.  10/12/19   Doree Albee, MD  blood glucose meter kit and supplies KIT Dx   e11.9 06/19/17   Raylene Everts, MD  Cholecalciferol 1.25 MG (50000 UT) TABS Take 10,000 Units by mouth once a week. Patient taking differently: Take 5,000 Units by mouth once a week.  07/15/19   Gosrani, Nimish C, MD  glipiZIDE (GLUCOTROL) 5 MG tablet TAKE 1 TABLET(5 MG) BY MOUTH TWICE DAILY BEFORE A MEAL Patient taking differently: Take 5 mg by mouth 2 (two) times daily before a meal.  11/14/19   Gosrani, Nimish C, MD  INVOKANA 100 MG TABS tablet TAKE 1 TABLET(100 MG) BY MOUTH DAILY BEFORE BREAKFAST Patient taking differently: Take 100 mg by mouth daily before breakfast.  10/12/19   Hurshel Party C, MD  loratadine (CLARITIN) 10 MG tablet Take 10 mg by mouth daily.    [provider]  losartan (COZAAR) 100 MG tablet TAKE 1 TABLET(100 MG) BY MOUTH DAILY Patient taking differently: Take 100 mg by mouth daily.  10/12/19   Doree Albee, MD  metFORMIN (GLUCOPHAGE) 500 MG tablet Take 1 tablet (500 mg total) by mouth daily with breakfast. 09/02/19   Ailene Ards, NP  Multiple Vitamin (MULTIVITAMIN WITH MINERALS) TABS tablet Take 1 tablet by mouth daily.    [provider]  Omega-3 Fatty Acids (FISH OIL PEARLS) 300 MG CAPS Take 600 mg by mouth 2 (two)  times daily. Patient taking differently: Take 300 mg by mouth daily.  07/15/19   Doree Albee, MD  omeprazole (PRILOSEC) 20 MG capsule Take 1 capsule (20 mg total) by mouth daily. 10/07/19   Erenest Rasher, PA-C  tadalafil (CIALIS) 20 MG tablet Take 0.5 tablets (10 mg total) by mouth every other day as needed for erectile dysfunction. Patient not taking: Reported on 11/16/2019 10/06/19   Ailene Ards, NP  vitamin C (ASCORBIC ACID) 500 MG tablet Take 500 mg by mouth daily.    [provider]  Selinda Michaels  PLUS test strip USE AS DIRECTED TO TEST TWICE DAILY AS NEEDED 08/19/17   Raylene Everts, MD  atorvastatin (LIPITOR) 10 MG tablet Take 1 tablet (10 mg total) by mouth daily. 07/14/19   Doree Albee, MD  canagliflozin (INVOKANA) 100 MG TABS tablet Take 1 tablet (100 mg total) by mouth daily before breakfast. 07/14/19   Anastasio Champion, Nimish C, MD  glipiZIDE (GLUCOTROL) 5 MG tablet Take 1 tablet (5 mg total) by mouth 2 (two) times daily before a meal. 07/12/19   Gosrani, Nimish C, MD  losartan (COZAAR) 100 MG tablet Take 1 tablet (100 mg total) by mouth daily. 07/15/19   Doree Albee, MD    Allergies    Onion and Other  Review of Systems   Review of Systems  Constitutional: Negative for appetite change, chills, fatigue and fever.  HENT: Negative for congestion and sore throat.   Respiratory: Negative for cough and shortness of breath.   Cardiovascular: Negative for chest pain.  Gastrointestinal: Positive for abdominal pain, nausea and vomiting. Negative for diarrhea.  Genitourinary: Positive for flank pain. Negative for decreased urine volume, dysuria, frequency and penile pain.  Musculoskeletal: Negative for back pain.  Skin: Negative for rash.    Physical Exam Updated Vital Signs BP (!) 162/68   Pulse 87   Temp 99.6 F (37.6 C) (Oral)   Resp 17   SpO2 97%   Physical Exam Vitals and nursing note reviewed.  Constitutional:      General: He is not in acute  distress.    Appearance: Normal appearance. He is well-developed. He is not ill-appearing, toxic-appearing or diaphoretic.  HENT:     Head: Normocephalic.  Eyes:     Conjunctiva/sclera: Conjunctivae normal.  Cardiovascular:     Rate and Rhythm: Normal rate and regular rhythm.  Pulmonary:     Effort: Pulmonary effort is normal.  Abdominal:     General: Abdomen is flat. Bowel sounds are normal. There is no distension.     Palpations: Abdomen is soft.     Tenderness: There is abdominal tenderness in the right lower quadrant. There is guarding. There is no right CVA tenderness, left CVA tenderness or rebound. Negative signs include Murphy's sign and McBurney's sign.  Skin:    General: Skin is dry.  Neurological:     Mental Status: He is alert.  Psychiatric:        Mood and Affect: Mood normal.     ED Results / Procedures / Treatments   Labs (all labs ordered are listed, but only abnormal results are displayed) Labs Reviewed  CBC WITH DIFFERENTIAL/PLATELET - Abnormal; Notable for the following components:      Result Value   WBC 17.5 (*)    Neutro Abs 14.0 (*)    Monocytes Absolute 1.3 (*)    Abs Immature Granulocytes 0.09 (*)    All other components within normal limits  COMPREHENSIVE METABOLIC PANEL - Abnormal; Notable for the following components:   Potassium 3.3 (*)    Glucose, Bld 173 (*)    All other components within normal limits  LIPASE, BLOOD  URINALYSIS, ROUTINE W REFLEX MICROSCOPIC    EKG None  Radiology CT Renal Stone Study  Result Date: 11/16/2019 CLINICAL DATA:  Acute onset of right flank pain 3 hours ago. Nausea and vomiting. EXAM: CT ABDOMEN AND PELVIS WITHOUT CONTRAST TECHNIQUE: Multidetector CT imaging of the abdomen and pelvis was performed following the standard protocol without IV contrast. COMPARISON:  None. FINDINGS: Lower chest:  No acute findings. Hepatobiliary: No mass visualized on this unenhanced exam. Gallbladder is unremarkable. No evidence of  biliary ductal dilatation. Pancreas: No mass or inflammatory process visualized on this unenhanced exam. Spleen:  Within normal limits in size. Adrenals/Urinary tract: A poorly defined area of high attenuation hemorrhage is seen within the posterior right kidney, with mild stranding throughout the right perinephric fat. Underlying renal mass cannot be excluded. No evidence of ureteral calculi or hydronephrosis. Stomach/Bowel: No evidence of obstruction, inflammatory process, or abnormal fluid collections. Diverticulosis is seen mainly involving the sigmoid colon, however there is no evidence of diverticulitis. Vascular/Lymphatic: No pathologically enlarged lymph nodes identified. No evidence of abdominal aortic aneurysm. Aortic atherosclerosis noted. Reproductive:  No mass or other significant abnormality. Other:  None. Musculoskeletal:  No suspicious bone lesions identified. IMPRESSION: 1. Poorly defined area of hemorrhage in the posterior right kidney. Underlying renal neoplasm cannot be excluded. Recommend abdomen CT with contrast for further evaluation. 2. No evidence of ureteral calculi or hydronephrosis. 3. Colonic diverticulosis, without radiographic evidence of diverticulitis. Aortic Atherosclerosis (ICD10-I70.0). Electronically Signed   By: Marlaine Hind M.D.   On: 11/16/2019 20:22    Procedures Procedures (including critical care time)  Medications Ordered in ED Medications  sodium chloride 0.9 % bolus 1,000 mL (has no administration in time range)  ketorolac (TORADOL) 30 MG/ML injection 15 mg (15 mg Intravenous Given 11/16/19 1935)  ondansetron (ZOFRAN) injection 4 mg (4 mg Intravenous Given 11/16/19 1935)    ED Course  I have reviewed the triage vital signs and the nursing notes.  Pertinent labs & imaging results that were available during my care of the patient were reviewed by me and considered in my medical decision making (see chart for details).  Clinical Course as of Nov 21 1557    Tue Nov 16, 2019  2050 Patient with acute right flank and belly pain. Ct showing ? Of renal hemorrhage on the right. Will repeat Ct with contrast. Elevated WBC count and mild hypokalemia but otherwise reassuring labs. UA pending. Pain controlled with toradol. Awaiting CT with contrast results and will need f/u with urolgoy.    [KM]  2159 Ct scan with contrast shows acute subcapsular perirenal hematoma with no active bleeding but does cause mass effect on the right kidney. Will consult with urology.    [KM]  2203 Spoke with Dr. Gloriann Loan about this patient. He advised to recheck CBC in one hour. If stable, he may go home and see PMD in 1-2 days for another repeat CBC to make sure he is stable. If stable, he may f/u with him in office in 1 month for repeat scan.   [KM]    Clinical Course User Index [KM] Kristine Royal   MDM Rules/Calculators/A&P                      Based on review of vitals, medical screening exam, lab work and/or imaging, there does not appear to be an acute, emergent etiology for the patient's symptoms. Counseled pt on good return precautions and encouraged both PCP and ED follow-up as needed.  Prior to discharge, I also discussed incidental imaging findings with patient in detail and advised appropriate, recommended follow-up in detail.  Clinical Impression: 1. Renal hemorrhage, right     Disposition: Discharge  Prior to providing a prescription for a controlled substance, I independently reviewed the patient's recent prescription history on the [place]. The patient had no recent  or regular prescriptions and was deemed appropriate for a brief, less than 3 day prescription of narcotic for acute analgesia.  This note was prepared with assistance of Systems analyst. Occasional wrong-word or sound-a-like substitutions may have occurred due to the inherent limitations of voice recognition software.  Final  Clinical Impression(s) / ED Diagnoses Final diagnoses:  None    Rx / DC Orders ED Discharge Orders    None       Kristine Royal 11/22/19 1559    Margette Fast, MD 11/24/19 [phone]

## 2019-11-16 NOTE — Discharge Instructions (Addendum)
Thank you for allowing me to care for you today. Please return to the emergency department if you have new or worsening symptoms. Take your medications as instructed.  You need to follow-up with the urologist in the next month for repeat scan.  You need to follow-up with your primary care doctor in the next week for repeat blood work.

## 2019-11-16 NOTE — ED Notes (Signed)
Pt took out his iv and states he was ready to go. I informed him we were waiting on blood test to come back.

## 2019-11-16 NOTE — ED Triage Notes (Signed)
EMS reports pt c/o abd and r flank pain for the past 2 1/2 hours.  Reports n/v.  Vomited x 1 with ems.  Denies diarrhea.  Reports pain is intermittent.  Denies history of kidney stone.

## 2019-11-16 NOTE — ED Provider Notes (Signed)
CBC reviewed,  Hemoglobin stable. Pt given discharge instructions by Dr. Nedra Hai, Lonia Skinner, PA-C 11/16/19 2345    Long, Arlyss Repress, MD 11/17/19 1240

## 2019-11-18 ENCOUNTER — Ambulatory Visit (INDEPENDENT_AMBULATORY_CARE_PROVIDER_SITE_OTHER): Payer: Medicare HMO | Admitting: Internal Medicine

## 2019-11-18 ENCOUNTER — Encounter (INDEPENDENT_AMBULATORY_CARE_PROVIDER_SITE_OTHER): Payer: Self-pay | Admitting: Internal Medicine

## 2019-11-18 ENCOUNTER — Other Ambulatory Visit: Payer: Self-pay

## 2019-11-18 VITALS — BP 160/85 | HR 86 | Temp 98.1°F | Ht 70.0 in | Wt 228.2 lb

## 2019-11-18 DIAGNOSIS — F320 Major depressive disorder, single episode, mild: Secondary | ICD-10-CM

## 2019-11-18 DIAGNOSIS — S37011A Minor contusion of right kidney, initial encounter: Secondary | ICD-10-CM | POA: Diagnosis not present

## 2019-11-18 NOTE — Progress Notes (Signed)
Metrics: Intervention Frequency ACO  Documented Smoking Status Yearly  Screened one or more times in 24 months  Cessation Counseling or  Active cessation medication Past 24 months  Past 24 months   Guideline developer: UpToDate (See UpToDate for funding source) Date Released: 2014       Wellness Office Visit  Subjective:  Patient ID: Alex Marks, male    DOB: 06/23/1946  Age: 74 y.o. MRN: [id number]  CC: This man comes in for follow-up after a visit to the emergency room a few days ago.  He presented with right flank pain. HPI CT scan of the abdomen was done in the emergency room which showed the following:  Moderate to large intrinsically dense subcapsular collection involving the right kidney from superior to inferior pole, consistent with acute subcapsular perirenal hematoma There is concern for possible kidney mass.  MRI was recommended for follow-up. He is also complaining of quite significant depression and PHQ-9 done today shows a score of 22. Past Medical History:  Diagnosis Date  . Anxiety   . COPD (chronic obstructive pulmonary disease) ([place])   . Depression   . Depression   . Frequency of urination   . GAD (generalized anxiety disorder)   . GERD (gastroesophageal reflux disease)   . Hepatitis C    "was treated; was cured after 3 month" (05/30/2017)  . Hypercholesterolemia   . Hypertension   . PAD (peripheral artery disease) ([place])   . Scalp lesion 04/12/2019  . Substance abuse ([place])   . Transfusion of blood product refused for religious reason   . Type II diabetes mellitus ([facility])       Family History  Problem Relation Age of Onset  . Alzheimer's disease Mother   . Mental illness Mother        Depression  . Breast cancer Mother   . Cancer Father        bladder?  [name] Hypertension Father   . Alcohol abuse Father   . Heart disease Sister   . Hearing loss Sister        valve  . Alcohol abuse Brother   . Diabetes Paternal Aunt   . COPD Sister   .  Alcohol abuse Brother   . Alcohol abuse Brother   . Alcohol abuse Brother   . Early death Brother        MVA  . Cancer Maternal Grandmother   . Colon cancer Neg Hx     Social History   Social History Narrative   Single.Live in boarding house   Renting a room   looking for apartment   Never had license   Likes to read   Social History   Tobacco Use  . Smoking status: Current Every Day Smoker    Packs/day: 1.00    Years: 57.00    Pack years: 57.00    Types: Cigarettes    Start date: 07/01/1961  . Smokeless tobacco: Never Used  . Tobacco comment: Has tried to quit, but his depressed mood makes it challenging to quit  Substance Use Topics  . Alcohol use: Not Currently    Comment: 06/07/19 hx:"nothing since ~ 2012"; patients states hasn't drank "in a while"    Current Meds  Medication Sig  . ACCU-CHEK AVIVA PLUS test strip CHECK BLOOD SUGAR EVERY DAY  . amLODipine (NORVASC) 10 MG tablet TAKE 1 TABLET(10 MG) BY MOUTH DAILY (Patient taking differently: Take 10 mg by mouth daily. )  . aspirin 325 MG EC tablet Take  1 tablet (325 mg total) by mouth daily. May take an additional 325 mgs as needed for sleep (Patient taking differently: Take 325 mg by mouth daily. )  . atorvastatin (LIPITOR) 10 MG tablet TAKE 1 TABLET(10 MG) BY MOUTH DAILY (Patient taking differently: Take 10 mg by mouth daily. )  . blood glucose meter kit and supplies KIT Dx   e11.9  . glipiZIDE (GLUCOTROL) 5 MG tablet TAKE 1 TABLET(5 MG) BY MOUTH TWICE DAILY BEFORE A MEAL (Patient taking differently: Take 5 mg by mouth 2 (two) times daily before a meal. )  . INVOKANA 100 MG TABS tablet TAKE 1 TABLET(100 MG) BY MOUTH DAILY BEFORE BREAKFAST (Patient taking differently: Take 100 mg by mouth daily before breakfast. )  . loratadine (CLARITIN) 10 MG tablet Take 10 mg by mouth daily.  [name] losartan (COZAAR) 100 MG tablet TAKE 1 TABLET(100 MG) BY MOUTH DAILY (Patient taking differently: Take 100 mg by mouth daily. )  . metFORMIN  (GLUCOPHAGE) 500 MG tablet Take 1 tablet (500 mg total) by mouth daily with breakfast.  . Multiple Vitamin (MULTIVITAMIN WITH MINERALS) TABS tablet Take 1 tablet by mouth daily.  . Omega-3 Fatty Acids (FISH OIL PEARLS) 300 MG CAPS Take 600 mg by mouth 2 (two) times daily. (Patient taking differently: Take 300 mg by mouth daily. )  . omeprazole (PRILOSEC) 20 MG capsule Take 1 capsule (20 mg total) by mouth daily.  . tadalafil (CIALIS) 20 MG tablet Take 0.5 tablets (10 mg total) by mouth every other day as needed for erectile dysfunction.  . vitamin C (ASCORBIC ACID) 500 MG tablet Take 500 mg by mouth daily.     Depression screen [facility] 2/9 11/18/2019 10/06/2019 07/08/2019 05/14/2017 02/11/2017  Decreased Interest 3 0 0 0 0  Down, Depressed, Hopeless 3 0 0 0 0  PHQ - 2 Score 6 0 0 0 0  Altered sleeping 3 - - - -  Tired, decreased energy 3 - - - -  Change in appetite 3 - - - -  Feeling bad or failure about yourself  3 - - - -  Trouble concentrating 3 - - - -  Moving slowly or fidgety/restless 1 - - - -  Suicidal thoughts 0 - - - -  PHQ-9 Score 22 - - - -     Objective:   Today's Vitals: BP (!) 160/85 (BP Location: Left Arm, Patient Position: Sitting, Cuff Size: Normal)   Pulse 86   Temp 98.1 F (36.7 C) (Temporal)   Ht '5\' 10"'  (1.778 m)   Wt 228 lb 3.2 oz (103.5 kg)   SpO2 95%   BMI 32.74 kg/m  Vitals with BMI 11/18/2019 11/16/2019 11/16/2019  Height '5\' 10"'  - -  Weight 228 lbs 3 oz - -  BMI 41.96 - -  Systolic 222 979 892  Diastolic 85 77 68  Pulse 86 92 -     Physical Exam   He does look systemically well and hemodynamically stable.  No new physical findings.    Assessment   1. Hematoma of right kidney, initial encounter   2. Current mild episode of major depressive disorder, unspecified whether recurrent ([place]) Chronic      Tests ordered Orders Placed This Encounter  Procedures  . MR ABDOMEN WO CONTRAST  . Ambulatory referral to Psychiatry     Plan: 1. We will try  to arrange MRI of the abdomen to look at the kidney in more detail. 2. I will also send him to  psychiatry for his depression. 3. Follow-up as scheduled.   No orders of the defined types were placed in this encounter.   Doree Albee, MD

## 2019-11-22 NOTE — Patient Instructions (Signed)
BOLUWATIFE FLIGHT  11/22/2019     @PREFPERIOPPHARMACY @   Your procedure is scheduled on  11/30/2019 .  Report to 01/30/2020 at  [phone]  A.M.  Call this number if you have problems the morning of surgery:  [phone]   Remember:  Follow the diet and prep instructions given to you by Dr [phone] office.                        Take these medicines the morning of surgery with A SIP OF WATER  Amlodipine, claritin, priolosec.    Do not wear jewelry, make-up or nail polish.  Do not wear lotions, powders, or perfumes. Please wear deodorant and brush your teeth.  Do not shave 48 hours prior to surgery.  Men may shave face and neck.  Do not bring valuables to the hospital.  [facility] is not responsible for any belongings or valuables.  Contacts, dentures or bridgework may not be worn into surgery.  Leave your suitcase in the car.  After surgery it may be brought to your room.  For patients admitted to the hospital, discharge time will be determined by your treatment team.  Patients discharged the day of surgery will not be allowed to drive home.   Name and phone number of your driver:   family Special instructions:  DO NOT smoke the morning of your procedure.  Please read over the following fact sheets that you were given. Anesthesia Post-op Instructions and Care and Recovery After Surgery       Colonoscopy, Adult, Care After This sheet gives you information about how to care for yourself after your procedure. Your health care provider may also give you more specific instructions. If you have problems or questions, contact your health care provider. What can I expect after the procedure? After the procedure, it is common to have:  A small amount of blood in your stool for 24 hours after the procedure.  Some gas.  Mild cramping or bloating of your abdomen. Follow these instructions at home: Eating and drinking   Drink enough fluid to keep your urine pale  yellow.  Follow instructions from your health care provider about eating or drinking restrictions.  Resume your normal diet as instructed by your health care provider. Avoid heavy or fried foods that are hard to digest. Activity  Rest as told by your health care provider.  Avoid sitting for a long time without moving. Get up to take short walks every 1-2 hours. This is important to improve blood flow and breathing. Ask for help if you feel weak or unsteady.  Return to your normal activities as told by your health care provider. Ask your health care provider what activities are safe for you. Managing cramping and bloating   Try walking around when you have cramps or feel bloated.  Apply heat to your abdomen as told by your health care provider. Use the heat source that your health care provider recommends, such as a moist heat pack or a heating pad. ? Place a towel between your skin and the heat source. ? Leave the heat on for 20-30 minutes. ? Remove the heat if your skin turns bright red. This is especially important if you are unable to feel pain, heat, or cold. You may have a greater risk of getting burned. General instructions  For the first 24 hours after the procedure: ? Do not drive or use machinery. ?  Do not sign important documents. ? Do not drink alcohol. ? Do your regular daily activities at a slower pace than normal. ? Eat soft foods that are easy to digest.  Take over-the-counter and prescription medicines only as told by your health care provider.  Keep all follow-up visits as told by your health care provider. This is important. Contact a health care provider if:  You have blood in your stool 2-3 days after the procedure. Get help right away if you have:  More than a small spotting of blood in your stool.  Large blood clots in your stool.  Swelling of your abdomen.  Nausea or vomiting.  A fever.  Increasing pain in your abdomen that is not relieved with  medicine. Summary  After the procedure, it is common to have a small amount of blood in your stool. You may also have mild cramping and bloating of your abdomen.  For the first 24 hours after the procedure, do not drive or use machinery, sign important documents, or drink alcohol.  Get help right away if you have a lot of blood in your stool, nausea or vomiting, a fever, or increased pain in your abdomen. This information is not intended to replace advice given to you by your health care provider. Make sure you discuss any questions you have with your health care provider. Document Revised: 01/11/2019 Document Reviewed: 01/11/2019 Elsevier Patient Education  [place] After These instructions provide you with information about caring for yourself after your procedure. Your health care provider may also give you more specific instructions. Your treatment has been planned according to current medical practices, but problems sometimes occur. Call your health care provider if you have any problems or questions after your procedure. What can I expect after the procedure? After your procedure, you may:  Feel sleepy for several hours.  Feel clumsy and have poor balance for several hours.  Feel forgetful about what happened after the procedure.  Have poor judgment for several hours.  Feel nauseous or vomit.  Have a sore throat if you had a breathing tube during the procedure. Follow these instructions at home: For at least 24 hours after the procedure:      Have a responsible adult stay with you. It is important to have someone help care for you until you are awake and alert.  Rest as needed.  Do not: ? Participate in activities in which you could fall or become injured. ? Drive. ? Use heavy machinery. ? Drink alcohol. ? Take sleeping pills or medicines that cause drowsiness. ? Make important decisions or sign legal documents. ? Take care  of children on your own. Eating and drinking  Follow the diet that is recommended by your health care provider.  If you vomit, drink water, juice, or soup when you can drink without vomiting.  Make sure you have little or no nausea before eating solid foods. General instructions  Take over-the-counter and prescription medicines only as told by your health care provider.  If you have sleep apnea, surgery and certain medicines can increase your risk for breathing problems. Follow instructions from your health care provider about wearing your sleep device: ? Anytime you are sleeping, including during daytime naps. ? While taking prescription pain medicines, sleeping medicines, or medicines that make you drowsy.  If you smoke, do not smoke without supervision.  Keep all follow-up visits as told by your health care provider. This is important. Contact a health  care provider if:  You keep feeling nauseous or you keep vomiting.  You feel light-headed.  You develop a rash.  You have a fever. Get help right away if:  You have trouble breathing. Summary  For several hours after your procedure, you may feel sleepy and have poor judgment.  Have a responsible adult stay with you for at least 24 hours or until you are awake and alert. This information is not intended to replace advice given to you by your health care provider. Make sure you discuss any questions you have with your health care provider. Document Revised: 09/15/2017 Document Reviewed: 10/08/2015 Elsevier Patient Education  [place].

## 2019-11-24 ENCOUNTER — Telehealth (INDEPENDENT_AMBULATORY_CARE_PROVIDER_SITE_OTHER): Payer: Self-pay

## 2019-11-24 NOTE — Telephone Encounter (Signed)
Open by accident

## 2019-11-25 ENCOUNTER — Telehealth: Payer: Self-pay

## 2019-11-25 NOTE — Telephone Encounter (Signed)
Pt called and wants to r/s his TCS 11/30/19. Pt went to the hospital a few days ago and isn't feeling like himself yet. [phone]

## 2019-11-25 NOTE — Telephone Encounter (Signed)
Noted  

## 2019-11-25 NOTE — Telephone Encounter (Signed)
Called pt, he is weak from being in the hospital. Advised him he will need OV to reschedule procedure since he has had change and been in hospital. LMOVM for endo scheduler to cancel procedure.  Darl Pikes, please schedule OV.  FYI to [facility]

## 2019-11-26 ENCOUNTER — Encounter (HOSPITAL_COMMUNITY)
Admission: RE | Admit: 2019-11-26 | Discharge: 2019-11-26 | Disposition: A | Discharge status: Home / Self Care | Payer: Medicare HMO | Source: Ambulatory Visit | Attending: Internal Medicine | Admitting: Internal Medicine

## 2019-11-26 ENCOUNTER — Other Ambulatory Visit (HOSPITAL_COMMUNITY): Payer: Medicare HMO

## 2019-11-26 ENCOUNTER — Encounter (HOSPITAL_COMMUNITY): Payer: Self-pay

## 2019-11-26 NOTE — Telephone Encounter (Signed)
OV made and appt card mailed °

## 2019-11-30 ENCOUNTER — Ambulatory Visit (HOSPITAL_COMMUNITY): Admission: RE | Admit: 2019-11-30 | Payer: Medicare HMO | Source: Home / Self Care | Admitting: Internal Medicine

## 2019-11-30 ENCOUNTER — Encounter (HOSPITAL_COMMUNITY): Admission: RE | Payer: Self-pay | Source: Home / Self Care

## 2019-11-30 SURGERY — COLONOSCOPY WITH PROPOFOL
Anesthesia: Monitor Anesthesia Care

## 2019-12-09 ENCOUNTER — Ambulatory Visit (HOSPITAL_COMMUNITY): Payer: Medicare HMO

## 2019-12-13 ENCOUNTER — Ambulatory Visit (HOSPITAL_COMMUNITY): Payer: Medicare HMO

## 2019-12-14 ENCOUNTER — Ambulatory Visit (INDEPENDENT_AMBULATORY_CARE_PROVIDER_SITE_OTHER): Payer: Medicare HMO | Admitting: Internal Medicine

## 2019-12-14 ENCOUNTER — Encounter (INDEPENDENT_AMBULATORY_CARE_PROVIDER_SITE_OTHER): Payer: Self-pay | Admitting: Internal Medicine

## 2019-12-14 ENCOUNTER — Other Ambulatory Visit: Payer: Self-pay

## 2019-12-14 ENCOUNTER — Encounter (INDEPENDENT_AMBULATORY_CARE_PROVIDER_SITE_OTHER): Payer: Self-pay

## 2019-12-14 VITALS — BP 144/88 | HR 78 | Temp 98.0°F | Resp 18 | Ht 70.0 in | Wt 218.4 lb

## 2019-12-14 DIAGNOSIS — E782 Mixed hyperlipidemia: Secondary | ICD-10-CM

## 2019-12-14 DIAGNOSIS — I10 Essential (primary) hypertension: Secondary | ICD-10-CM | POA: Diagnosis not present

## 2019-12-14 DIAGNOSIS — E119 Type 2 diabetes mellitus without complications: Secondary | ICD-10-CM | POA: Diagnosis not present

## 2019-12-14 NOTE — Progress Notes (Signed)
Metrics: Intervention Frequency ACO  Documented Smoking Status Yearly  Screened one or more times in 24 months  Cessation Counseling or  Active cessation medication Past 24 months  Past 24 months   Guideline developer: UpToDate (See UpToDate for funding source) Date Released: 2014       Wellness Office Visit  Subjective:  Patient ID: Alex Marks, male    DOB: Mar 12, 1946  Age: 74 y.o. MRN: [id number]  CC: This man comes in for follow-up of diabetes, hypertension, hyperlipidemia. HPI  He is to drink 2 L of Coke every day but for the last 2 weeks, he has not been drinking any Coke whatsoever.  The pain that he was experienced in the right loin area has completely resolved.  However, he does have an abnormality on his CT scan and is due to have an MRI abdomen done in the next 8 days. He continues on amlodipine and losartan for hypertension. He continues on Metformin and glipizide for his diabetes.  He also takes Invokana. He continues on atorvastatin for his hyperlipidemia in the face of diabetes. He denies any chest pain, dyspnea or palpitations. Past Medical History:  Diagnosis Date  . Anxiety   . COPD (chronic obstructive pulmonary disease) ([place])   . Depression   . Depression   . Frequency of urination   . GAD (generalized anxiety disorder)   . GERD (gastroesophageal reflux disease)   . Hepatitis C    "was treated; was cured after 3 month" (05/30/2017)  . Hypercholesterolemia   . Hypertension   . PAD (peripheral artery disease) ([place])   . Scalp lesion 04/12/2019  . Substance abuse ([place])   . Transfusion of blood product refused for religious reason   . Type II diabetes mellitus ([place])    Past Surgical History:  Procedure Laterality Date  . ABDOMINAL AORTOGRAM W/LOWER EXTREMITY Bilateral 05/30/2017  . ABDOMINAL AORTOGRAM W/LOWER EXTREMITY N/A 05/30/2017   Procedure: ABDOMINAL AORTOGRAM W/LOWER EXTREMITY;  Surgeon: Elam Dutch, MD;  Location: [place];   Service: Cardiovascular;  Laterality: N/A;  Bilateral     Family History  Problem Relation Age of Onset  . Alzheimer's disease Mother   . Mental illness Mother        Depression  . Breast cancer Mother   . Cancer Father        bladder?  [name] Hypertension Father   . Alcohol abuse Father   . Heart disease Sister   . Hearing loss Sister        valve  . Alcohol abuse Brother   . Diabetes Paternal Aunt   . COPD Sister   . Alcohol abuse Brother   . Alcohol abuse Brother   . Alcohol abuse Brother   . Early death Brother        MVA  . Cancer Maternal Grandmother   . Colon cancer Neg Hx     Social History   Social History Narrative   Single.Live in boarding house   Renting a room   looking for apartment   Never had license   Likes to read   Social History   Tobacco Use  . Smoking status: Current Every Day Smoker    Packs/day: 1.00    Years: 57.00    Pack years: 57.00    Types: Cigarettes    Start date: 07/01/1961  . Smokeless tobacco: Never Used  . Tobacco comment: Has tried to quit, but his depressed mood makes it challenging to quit  Substance  Use Topics  . Alcohol use: Not Currently    Comment: 06/07/19 hx:"nothing since ~ 2012"; patients states hasn't drank "in a while"    Current Meds  Medication Sig  . ACCU-CHEK AVIVA PLUS test strip CHECK BLOOD SUGAR EVERY DAY  . amLODipine (NORVASC) 10 MG tablet TAKE 1 TABLET(10 MG) BY MOUTH DAILY (Patient taking differently: Take 10 mg by mouth daily. )  . aspirin 325 MG EC tablet Take 1 tablet (325 mg total) by mouth daily. May take an additional 325 mgs as needed for sleep (Patient taking differently: Take 325 mg by mouth daily. )  . atorvastatin (LIPITOR) 10 MG tablet TAKE 1 TABLET(10 MG) BY MOUTH DAILY (Patient taking differently: Take 10 mg by mouth daily. )  . blood glucose meter kit and supplies KIT Dx   e11.9  . Cholecalciferol 1.25 MG (50000 UT) TABS Take 10,000 Units by mouth once a week. (Patient taking differently:  Take 5,000 Units by mouth once a week. )  . glipiZIDE (GLUCOTROL) 5 MG tablet TAKE 1 TABLET(5 MG) BY MOUTH TWICE DAILY BEFORE A MEAL (Patient taking differently: Take 5 mg by mouth 2 (two) times daily before a meal. )  . INVOKANA 100 MG TABS tablet TAKE 1 TABLET(100 MG) BY MOUTH DAILY BEFORE BREAKFAST (Patient taking differently: Take 100 mg by mouth daily before breakfast. )  . loratadine (CLARITIN) 10 MG tablet Take 10 mg by mouth daily.  [name] losartan (COZAAR) 100 MG tablet TAKE 1 TABLET(100 MG) BY MOUTH DAILY (Patient taking differently: Take 100 mg by mouth daily. )  . metFORMIN (GLUCOPHAGE) 500 MG tablet Take 1 tablet (500 mg total) by mouth daily with breakfast.  . Multiple Vitamin (MULTIVITAMIN WITH MINERALS) TABS tablet Take 1 tablet by mouth daily.  . Omega-3 Fatty Acids (FISH OIL PEARLS) 300 MG CAPS Take 600 mg by mouth 2 (two) times daily. (Patient taking differently: Take 300 mg by mouth daily. )  . omeprazole (PRILOSEC) 20 MG capsule Take 1 capsule (20 mg total) by mouth daily.  . tadalafil (CIALIS) 20 MG tablet Take 0.5 tablets (10 mg total) by mouth every other day as needed for erectile dysfunction.  . vitamin C (ASCORBIC ACID) 500 MG tablet Take 500 mg by mouth daily.      Depression screen [facility] 2/9 11/18/2019 10/06/2019 07/08/2019 05/14/2017 02/11/2017  Decreased Interest 3 0 0 0 0  Down, Depressed, Hopeless 3 0 0 0 0  PHQ - 2 Score 6 0 0 0 0  Altered sleeping 3 - - - -  Tired, decreased energy 3 - - - -  Change in appetite 3 - - - -  Feeling bad or failure about yourself  3 - - - -  Trouble concentrating 3 - - - -  Moving slowly or fidgety/restless 1 - - - -  Suicidal thoughts 0 - - - -  PHQ-9 Score 22 - - - -     Objective:   Today's Vitals: BP (!) 144/88 (BP Location: Left Arm, Patient Position: Sitting, Cuff Size: Normal)   Pulse 78   Temp 98 F (36.7 C) (Temporal)   Resp 18   Ht '5\' 10"'  (1.778 m)   Wt 218 lb 6.4 oz (99.1 kg)   SpO2 98%   BMI 31.34 kg/m  Vitals  with BMI 12/14/2019 11/18/2019 11/16/2019  Height '5\' 10"'  '5\' 10"'  -  Weight 218 lbs 6 oz 228 lbs 3 oz -  BMI 59.16 38.46 -  Systolic 659 935 701  Diastolic 88 85 77  Pulse 78 86 92     Physical Exam   He looks systemically well.  He has lost 10 pounds in the last 1 month.  His blood pressure has improved.  He looks more cheerful and happier.    Assessment   1. Type 2 diabetes mellitus without complication, without long-term current use of insulin ([place])   2. Mixed hyperlipidemia   3. Essential hypertension       Tests ordered Orders Placed This Encounter  Procedures  . COMPLETE METABOLIC PANEL WITH GFR  . Hemoglobin A1c  . Lipid panel     Plan: 1. Blood work is ordered. 2. He will continue with his oral hypoglycemic agents and we will see what his A1c is now.  I suspect it should be lower. 3. He will continue with statin therapy and we will check a lipid panel. 4. He will continue all his antihypertensive medications including the amlodipine and losartan and his blood pressure is improving.  I suspect it will improve further as he continues to lose weight. 5. Further recommendations will depend on blood results and I will see him in 1 month for follow-up closely.   No orders of the defined types were placed in this encounter.   Doree Albee, MD

## 2019-12-15 LAB — LIPID PANEL
Cholesterol: 112 mg/dL (ref <200)
HDL: 29 mg/dL — ABNORMAL LOW (ref >=40)
LDL Cholesterol (Calc): 62 mg/dL (calc)
Non-HDL Cholesterol (Calc): 83 mg/dL (calc) (ref <130)
Total CHOL/HDL Ratio: 3.9 (calc) (ref <5.0)
Triglycerides: 120 mg/dL (ref <150)

## 2019-12-15 LAB — COMPLETE METABOLIC PANEL WITH GFR
AG Ratio: 1.4 (calc) (ref 1.0–2.5)
ALT: 21 U/L (ref 9–46)
AST: 15 U/L (ref 10–35)
Albumin: 4.1 g/dL (ref 3.6–5.1)
Alkaline phosphatase (APISO): 115 U/L (ref 35–144)
BUN/Creatinine Ratio: 10 (calc) (ref 6–22)
BUN: 12 mg/dL (ref 7–25)
CO2: 23 mmol/L (ref 20–32)
Calcium: 9.4 mg/dL (ref 8.6–10.3)
Chloride: 108 mmol/L (ref 98–110)
Creat: 1.24 mg/dL — ABNORMAL HIGH (ref 0.70–1.18)
GFR, Est African American: 67 mL/min/{1.73_m2} (ref >=60)
GFR, Est Non African American: 58 mL/min/{1.73_m2} — ABNORMAL LOW (ref >=60)
Globulin: 2.9 g/dL (calc) (ref 1.9–3.7)
Glucose, Bld: 95 mg/dL (ref 65–99)
Potassium: 4.3 mmol/L (ref 3.5–5.3)
Sodium: 139 mmol/L (ref 135–146)
Total Bilirubin: 0.4 mg/dL (ref 0.2–1.2)
Total Protein: 7 g/dL (ref 6.1–8.1)

## 2019-12-15 LAB — HEMOGLOBIN A1C
Hgb A1c MFr Bld: 6.8 % of total Hgb — ABNORMAL HIGH (ref <5.7)
Mean Plasma Glucose: 148 (calc)
eAG (mmol/L): 8.2 (calc)

## 2019-12-15 NOTE — Progress Notes (Signed)
Pt was called. Given instructions Pt will be increasing water intake daily.

## 2019-12-15 NOTE — Progress Notes (Signed)
Please call this patient and tell him that his diabetes is much improved.  Keep up the good work!His kidney function is a little worse so make sure he drinks lots of water every day.Follow-up as scheduled.

## 2019-12-22 ENCOUNTER — Other Ambulatory Visit: Payer: Self-pay

## 2019-12-22 ENCOUNTER — Ambulatory Visit (HOSPITAL_COMMUNITY)
Admission: RE | Admit: 2019-12-22 | Discharge: 2019-12-22 | Disposition: A | Discharge status: Home / Self Care | Payer: Medicare HMO | Source: Ambulatory Visit | Attending: Internal Medicine | Admitting: Internal Medicine

## 2019-12-22 DIAGNOSIS — S37011A Minor contusion of right kidney, initial encounter: Secondary | ICD-10-CM | POA: Diagnosis not present

## 2020-01-06 ENCOUNTER — Other Ambulatory Visit (INDEPENDENT_AMBULATORY_CARE_PROVIDER_SITE_OTHER): Payer: Self-pay | Admitting: Nurse Practitioner

## 2020-01-09 ENCOUNTER — Other Ambulatory Visit (INDEPENDENT_AMBULATORY_CARE_PROVIDER_SITE_OTHER): Payer: Self-pay | Admitting: Internal Medicine

## 2020-01-09 DIAGNOSIS — E119 Type 2 diabetes mellitus without complications: Secondary | ICD-10-CM

## 2020-01-11 ENCOUNTER — Encounter (INDEPENDENT_AMBULATORY_CARE_PROVIDER_SITE_OTHER): Payer: Self-pay | Admitting: Internal Medicine

## 2020-01-11 ENCOUNTER — Ambulatory Visit (INDEPENDENT_AMBULATORY_CARE_PROVIDER_SITE_OTHER): Payer: Medicare HMO | Admitting: Internal Medicine

## 2020-01-11 ENCOUNTER — Other Ambulatory Visit: Payer: Self-pay

## 2020-01-11 VITALS — BP 125/79 | HR 70 | Temp 97.9°F | Ht 70.0 in | Wt 220.8 lb

## 2020-01-11 DIAGNOSIS — I10 Essential (primary) hypertension: Secondary | ICD-10-CM

## 2020-01-11 DIAGNOSIS — E119 Type 2 diabetes mellitus without complications: Secondary | ICD-10-CM | POA: Diagnosis not present

## 2020-01-11 NOTE — Progress Notes (Signed)
Metrics: Intervention Frequency ACO  Documented Smoking Status Yearly  Screened one or more times in 24 months  Cessation Counseling or  Active cessation medication Past 24 months  Past 24 months   Guideline developer: UpToDate (See UpToDate for funding source) Date Released: 2014       Wellness Office Visit  Subjective:  Patient ID: Alex Marks, male    DOB: 1945/09/15  Age: 74 y.o. MRN: [id number]  CC: This man comes in for follow-up of diabetes and hypertension. HPI  He continues to stay away from drinking Coke every day like he used to.  He has managed to lose weight and keep it off. On the last visit, he was somewhat dehydrated and I have encouraged him to drink more water.  He has been doing so apparently. He feels more energized now that he has given up drinking Coke every day. Past Medical History:  Diagnosis Date  . Anxiety   . COPD (chronic obstructive pulmonary disease) ([place])   . Depression   . Depression   . Frequency of urination   . GAD (generalized anxiety disorder)   . GERD (gastroesophageal reflux disease)   . Hepatitis C    "was treated; was cured after 3 month" (05/30/2017)  . Hypercholesterolemia   . Hypertension   . PAD (peripheral artery disease) ([place])   . Scalp lesion 04/12/2019  . Substance abuse ([place])   . Transfusion of blood product refused for religious reason   . Type II diabetes mellitus ([place])    Past Surgical History:  Procedure Laterality Date  . ABDOMINAL AORTOGRAM W/LOWER EXTREMITY Bilateral 05/30/2017  . ABDOMINAL AORTOGRAM W/LOWER EXTREMITY N/A 05/30/2017   Procedure: ABDOMINAL AORTOGRAM W/LOWER EXTREMITY;  Surgeon: Elam Dutch, MD;  Location: [facility];  Service: Cardiovascular;  Laterality: N/A;  Bilateral     Family History  Problem Relation Age of Onset  . Alzheimer's disease Mother   . Mental illness Mother        Depression  . Breast cancer Mother   . Cancer Father        bladder?  [name] Hypertension Father    . Alcohol abuse Father   . Heart disease Sister   . Hearing loss Sister        valve  . Alcohol abuse Brother   . Diabetes Paternal Aunt   . COPD Sister   . Alcohol abuse Brother   . Alcohol abuse Brother   . Alcohol abuse Brother   . Early death Brother        MVA  . Cancer Maternal Grandmother   . Colon cancer Neg Hx     Social History   Social History Narrative   Single.Live in boarding house   Renting a room   looking for apartment   Never had license   Likes to read   Social History   Tobacco Use  . Smoking status: Current Every Day Smoker    Packs/day: 1.00    Years: 57.00    Pack years: 57.00    Types: Cigarettes    Start date: 07/01/1961  . Smokeless tobacco: Never Used  . Tobacco comment: Has tried to quit, but his depressed mood makes it challenging to quit  Substance Use Topics  . Alcohol use: Not Currently    Comment: 06/07/19 hx:"nothing since ~ 2012"; patients states hasn't drank "in a while"    Current Meds  Medication Sig  . ACCU-CHEK AVIVA PLUS test strip CHECK BLOOD SUGAR EVERY  DAY  . amLODipine (NORVASC) 10 MG tablet TAKE 1 TABLET(10 MG) BY MOUTH DAILY  . aspirin 325 MG EC tablet Take 1 tablet (325 mg total) by mouth daily. May take an additional 325 mgs as needed for sleep (Patient taking differently: Take 325 mg by mouth daily. )  . atorvastatin (LIPITOR) 10 MG tablet Take 1 tablet (10 mg total) by mouth daily.  . blood glucose meter kit and supplies KIT Dx   e11.9  . canagliflozin (INVOKANA) 100 MG TABS tablet Take 1 tablet (100 mg total) by mouth daily before breakfast.  . Cholecalciferol 1.25 MG (50000 UT) TABS Take 10,000 Units by mouth once a week. (Patient taking differently: Take 5,000 Units by mouth once a week. )  . glipiZIDE (GLUCOTROL) 5 MG tablet TAKE 1 TABLET(5 MG) BY MOUTH TWICE DAILY BEFORE A MEAL (Patient taking differently: Take 5 mg by mouth 2 (two) times daily before a meal. )  . loratadine (CLARITIN) 10 MG tablet Take 10 mg by  mouth daily.  [name] losartan (COZAAR) 100 MG tablet Take 1 tablet (100 mg total) by mouth daily.  . metFORMIN (GLUCOPHAGE) 500 MG tablet Take 1 tablet (500 mg total) by mouth daily with breakfast.  . Multiple Vitamin (MULTIVITAMIN WITH MINERALS) TABS tablet Take 1 tablet by mouth daily.  . Omega-3 Fatty Acids (FISH OIL PEARLS) 300 MG CAPS Take 600 mg by mouth 2 (two) times daily. (Patient taking differently: Take 300 mg by mouth daily. )  . omeprazole (PRILOSEC) 20 MG capsule Take 1 capsule (20 mg total) by mouth daily.  . tadalafil (CIALIS) 20 MG tablet Take 0.5 tablets (10 mg total) by mouth every other day as needed for erectile dysfunction.  . vitamin C (ASCORBIC ACID) 500 MG tablet Take 500 mg by mouth daily.       Depression screen [facility] 2/9 11/18/2019 10/06/2019 07/08/2019 05/14/2017 02/11/2017  Decreased Interest 3 0 0 0 0  Down, Depressed, Hopeless 3 0 0 0 0  PHQ - 2 Score 6 0 0 0 0  Altered sleeping 3 - - - -  Tired, decreased energy 3 - - - -  Change in appetite 3 - - - -  Feeling bad or failure about yourself  3 - - - -  Trouble concentrating 3 - - - -  Moving slowly or fidgety/restless 1 - - - -  Suicidal thoughts 0 - - - -  PHQ-9 Score 22 - - - -     Objective:   Today's Vitals: BP 125/79 (BP Location: Left Arm, Patient Position: Sitting, Cuff Size: Normal)   Pulse 70   Temp 97.9 F (36.6 C) (Temporal)   Ht '5\' 10"'  (1.778 m)   Wt 220 lb 12.8 oz (100.2 kg)   SpO2 97%   BMI 31.68 kg/m  Vitals with BMI 01/11/2020 12/14/2019 11/18/2019  Height '5\' 10"'  '5\' 10"'  '5\' 10"'   Weight 220 lbs 13 oz 218 lbs 6 oz 228 lbs 3 oz  BMI 31.68 31.54 00.86  Systolic 761 950 932  Diastolic 79 88 85  Pulse 70 78 86     Physical Exam  He looks systemically well.  His weight is stable.  His blood pressure is excellent for his age.     Assessment   1. Essential hypertension   2. Type 2 diabetes mellitus without complication, without long-term current use of insulin ([facility])       Tests  ordered Orders Placed This Encounter  Procedures  . COMPLETE METABOLIC  PANEL WITH GFR     Plan: 1. He will continue with amlodipine and losartan for his hypertension which is controlling his blood pressure. 2. As far as his diabetes is concerned, he will continue with Metformin and glipizide as well as  Invokana.  His last hemoglobin A1c was much improved at 6.8%. 3. I will see him in 3 months time for follow-up and further recommendations will depend on blood work.   No orders of the defined types were placed in this encounter.   Doree Albee, MD

## 2020-01-12 LAB — COMPLETE METABOLIC PANEL WITH GFR
AG Ratio: 1.5 (calc) (ref 1.0–2.5)
ALT: 18 U/L (ref 9–46)
AST: 17 U/L (ref 10–35)
Albumin: 4.1 g/dL (ref 3.6–5.1)
Alkaline phosphatase (APISO): 112 U/L (ref 35–144)
BUN/Creatinine Ratio: 12 (calc) (ref 6–22)
BUN: 15 mg/dL (ref 7–25)
CO2: 22 mmol/L (ref 20–32)
Calcium: 9.2 mg/dL (ref 8.6–10.3)
Chloride: 106 mmol/L (ref 98–110)
Creat: 1.27 mg/dL — ABNORMAL HIGH (ref 0.70–1.18)
GFR, Est African American: 65 mL/min/{1.73_m2} (ref >=60)
GFR, Est Non African American: 56 mL/min/{1.73_m2} — ABNORMAL LOW (ref >=60)
Globulin: 2.8 g/dL (calc) (ref 1.9–3.7)
Glucose, Bld: 66 mg/dL (ref 65–99)
Potassium: 4 mmol/L (ref 3.5–5.3)
Sodium: 140 mmol/L (ref 135–146)
Total Bilirubin: 0.5 mg/dL (ref 0.2–1.2)
Total Protein: 6.9 g/dL (ref 6.1–8.1)

## 2020-01-12 NOTE — Progress Notes (Signed)
Please call this patient and let him know that the blood tests are stable.  Continue to encourage increased water intake.  Follow-up as scheduled.

## 2020-01-12 NOTE — Progress Notes (Signed)
Patient called.  Given lab results. Instructions to drink more water and to come back to f/o appt set 10/23.

## 2020-01-25 NOTE — Progress Notes (Signed)
Referring Provider: Doree Albee, MD Primary Care Physician:  Doree Albee, MD Primary GI Physician: Dr. Gala Romney  Chief Complaint  Patient presents with  . Colonoscopy    needs to reschedule tcs    HPI:   Alex Marks is a 74 y.o. male presenting today to discuss rescheduling his colonoscopy.  He was last seen in our office 10/07/2019 due to positive fit test completed in March 2021 with need for colonoscopy.  No prior colonoscopy.  Denied BRBPR, melena, unintentional weight loss, or any other significant GI symptoms.  History of alcohol and substance abuse with patient reported he had not used any of these substances in about 10 years.  Noted history of hepatitis C that was treated in 2017 by Dr. Paulita Fujita.  HCVRNA not detected in September 2017.  He did not complete labs to confirm SVR.  Abdominal ultrasound with elastography June 2017 with normal parenchymal echogenicity of the liver but Metavir fibrosis score F2/F3.  He was without signs or symptoms of decompensated liver disease.  He has taken 325 mg aspirin daily and Alka-Seltzer at night to prevent cold.  He was started on low-dose omeprazole due to chronic NSAID use, scheduled for colonoscopy, plan to update CBC due to reported fatigue to ensure no anemia, update ultrasound.  Prior labs in March 2021 with LFTs within normal limits.  Labs completed 10/07/2019: Hemoglobin 16.4.  HCVRNA not detected. Ultrasound 4/13 with nodular hepatic contour with increased parenchymal echogenicity.  No focal liver lesion. 10/14/19: INR 1.0, AFP within normal limits.  Meld 6.  Patient presented to the ED 11/16/2019 due to acute onset right flank pain, abdominal pain with associated nausea and vomiting.  WBC 17.5, hemoglobin 15.5, potassium slightly low at 3.3, lipase within normal limits. CT renal study with poorly defined area of hemorrhage in the posterior right kidney.  Underlying renal neoplasm not excluded. CT A/P with contrast with moderate to  large intrinsically dense subcapsular collection involving the right kidney from superior to inferior pole, consistent with acute subcapsular perirenal hematoma.  No suggestion of active bleeding.  Mass-effect on underlying renal parenchyma, placing kidney at risk for page kidney.  Convex contour deformity at posterior aspect of hematoma suggesting possible mass noted density somewhat indistinguishable from adjacent hematoma.  Follow-up MRI recommended.  MRI 12/22/2019 with right posterior renal subcapsular hematoma decreased in volume by 60% and demonstrates characteristics primarily of late subacute hemorrhage.  No obvious right renal mass.  Today: Feeling much better. Has stopped drinking coke. No abdominal pain or back pain. BMs daily. No constipation or diarrhea. No blood in the stool or balck stool. No nausea or vomiting. No GERD symptoms or dysphagia.   No cold or flu like symptoms. No pre-syncope or syncope. No CP, heart palpitations, or SOB at rest. Some SOB with exertion. Occasional cough. No inhalers or oxygen needed.   No swelling in LE or abdomen, no mental status changes, bruising or bleeding, dark urine, or yellowing of his eyes.   Last drank alcohol about 3 years ago. Used to drink heavily.  No drug use in 3-4 years. Last used heroin in his 61s. Marijuana and cocaine up to 3 years ago.   Past Medical History:  Diagnosis Date  . Anxiety   . COPD (chronic obstructive pulmonary disease) ([place])   . Depression   . Depression   . Frequency of urination   . GAD (generalized anxiety disorder)   . GERD (gastroesophageal reflux disease)   . Hepatitis  C    s/p treatment with Dr. Paulita Fujita in 2017. HCV RNA not detected in April 2021  . Hypercholesterolemia   . Hypertension   . PAD (peripheral artery disease) ([place])   . Scalp lesion 04/12/2019  . Substance abuse ([place])   . Transfusion of blood product refused for religious reason   . Type II diabetes mellitus ([place])     Past Surgical  History:  Procedure Laterality Date  . ABDOMINAL AORTOGRAM W/LOWER EXTREMITY Bilateral 05/30/2017  . ABDOMINAL AORTOGRAM W/LOWER EXTREMITY N/A 05/30/2017   Procedure: ABDOMINAL AORTOGRAM W/LOWER EXTREMITY;  Surgeon: Elam Dutch, MD;  Location: [facility];  Service: Cardiovascular;  Laterality: N/A;  Bilateral    Current Outpatient Medications  Medication Sig Dispense Refill  . ACCU-CHEK AVIVA PLUS test strip CHECK BLOOD SUGAR EVERY DAY 100 strip 3  . amLODipine (NORVASC) 10 MG tablet TAKE 1 TABLET(10 MG) BY MOUTH DAILY 90 tablet 0  . aspirin 325 MG EC tablet Take 1 tablet (325 mg total) by mouth daily. May take an additional 325 mgs as needed for sleep (Patient taking differently: Take 325 mg by mouth daily. ) 90 tablet 0  . atorvastatin (LIPITOR) 10 MG tablet Take 1 tablet (10 mg total) by mouth daily. 90 tablet 0  . blood glucose meter kit and supplies KIT Dx   e11.9 1 each 0  . canagliflozin (INVOKANA) 100 MG TABS tablet Take 1 tablet (100 mg total) by mouth daily before breakfast. 90 tablet 0  . Cholecalciferol (VITAMIN D3) 125 MCG (5000 UT) TABS Take by mouth daily.    [name] glipiZIDE (GLUCOTROL) 5 MG tablet TAKE 1 TABLET(5 MG) BY MOUTH TWICE DAILY BEFORE A MEAL (Patient taking differently: Take 5 mg by mouth 2 (two) times daily before a meal. ) 60 tablet 3  . loratadine (CLARITIN) 10 MG tablet Take 10 mg by mouth daily.    [name] losartan (COZAAR) 100 MG tablet Take 1 tablet (100 mg total) by mouth daily. 90 tablet 0  . metFORMIN (GLUCOPHAGE) 500 MG tablet Take 1 tablet (500 mg total) by mouth daily with breakfast. 180 tablet 3  . Multiple Vitamin (MULTIVITAMIN WITH MINERALS) TABS tablet Take 1 tablet by mouth daily.    . Omega-3 Fatty Acids (FISH OIL PEARLS) 300 MG CAPS Take 600 mg by mouth 2 (two) times daily. (Patient taking differently: Take 300 mg by mouth daily. ) 60 capsule 3  . omeprazole (PRILOSEC) 20 MG capsule Take 1 capsule (20 mg total) by mouth daily. 30 capsule 5  .  tadalafil (CIALIS) 20 MG tablet Take 0.5 tablets (10 mg total) by mouth every other day as needed for erectile dysfunction. 5 tablet 2  . vitamin C (ASCORBIC ACID) 500 MG tablet Take 500 mg by mouth daily.     No current facility-administered medications for this visit.    Allergies as of 01/26/2020 - Review Complete 01/26/2020  Allergen Reaction Noted  . Onion  05/27/2017  . Other Swelling 06/07/2019    Family History  Problem Relation Age of Onset  . Alzheimer's disease Mother   . Mental illness Mother        Depression  . Breast cancer Mother   . Cancer Father        bladder?  [name] Hypertension Father   . Alcohol abuse Father   . Heart disease Sister   . Hearing loss Sister        valve  . Alcohol abuse Brother   . Diabetes Paternal  Aunt   . COPD Sister   . Alcohol abuse Brother   . Alcohol abuse Brother   . Alcohol abuse Brother   . Early death Brother        MVA  . Cancer Maternal Grandmother   . Colon cancer Neg Hx   . Liver cancer Neg Hx     Social History   Socioeconomic History  . Marital status: Single    Spouse name: Not on file  . Number of children: 0  . Years of education: 74  . Highest education level: Not on file  Occupational History  . Occupation: retired    Comment: labor  Tobacco Use  . Smoking status: Current Every Day Smoker    Packs/day: 1.00    Years: 57.00    Pack years: 57.00    Types: Cigarettes    Start date: 07/01/1961  . Smokeless tobacco: Never Used  . Tobacco comment: Has tried to quit, but his depressed mood makes it challenging to quit  Vaping Use  . Vaping Use: Never used  Substance and Sexual Activity  . Alcohol use: Not Currently    Comment: history of heavy alcohol use; last drank alcohol about 3 years ago  . Drug use: Not Currently    Types: Marijuana, Cocaine, Heroin, "Crack" cocaine    Comment: Last used heroin in his 49s. Last used cocaine and marijuana about 3 years ago.   [name] Sexual activity: Not Currently  Other  Topics Concern  . Not on file  Social History Narrative   Single.Live in boarding house   Renting a room   looking for apartment   Never had license   Likes to read   Social Determinants of Health   Financial Resource Strain:   . Difficulty of Paying Living Expenses:   Food Insecurity:   . Worried About Charity fundraiser in the Last Year:   . Arboriculturist in the Last Year:   Transportation Needs:   . Film/video editor (Medical):   [name] Lack of Transportation (Non-Medical):   Physical Activity:   . Days of Exercise per Week:   . Minutes of Exercise per Session:   Stress:   . Feeling of Stress :   Social Connections:   . Frequency of Communication with Friends and Family:   . Frequency of Social Gatherings with Friends and Family:   . Attends Religious Services:   . Active Member of Clubs or Organizations:   . Attends Archivist Meetings:   [name] Marital Status:     Review of Systems: Gen: See HPI CV: See HPI Resp: See HPI GI: See HPI Heme: See HPI  Physical Exam: BP (!) 144/79   Pulse 67   Temp (!) 96.9 F (36.1 C) (Temporal)   Ht '5\' 10"'  (1.778 m)   Wt (!) 222 lb 3.2 oz (100.8 kg)   BMI 31.88 kg/m  General:   Alert and oriented. No distress noted. Pleasant and cooperative.  Head:  Normocephalic and atraumatic. Eyes:  Conjuctiva clear without scleral icterus. Heart:  S1, S2 present without murmurs appreciated. Lungs:  Clear to auscultation bilaterally. No wheezes, rales, or rhonchi. No distress.  Abdomen:  +BS, soft, non-tender and non-distended. No rebound or guarding. No HSM or masses noted. Msk:  Symmetrical without gross deformities. Normal posture. Extremities:  Without edema. Neurologic:  Alert and  oriented x4 Psych: Normal mood and affect.

## 2020-01-26 ENCOUNTER — Telehealth: Payer: Self-pay | Admitting: Gastroenterology

## 2020-01-26 ENCOUNTER — Other Ambulatory Visit: Payer: Self-pay

## 2020-01-26 ENCOUNTER — Ambulatory Visit (INDEPENDENT_AMBULATORY_CARE_PROVIDER_SITE_OTHER): Payer: Medicare HMO | Admitting: Gastroenterology

## 2020-01-26 ENCOUNTER — Encounter: Payer: Self-pay | Admitting: Gastroenterology

## 2020-01-26 VITALS — BP 144/79 | HR 67 | Temp 96.9°F | Ht 70.0 in | Wt 222.2 lb

## 2020-01-26 DIAGNOSIS — R932 Abnormal findings on diagnostic imaging of liver and biliary tract: Secondary | ICD-10-CM

## 2020-01-26 DIAGNOSIS — J449 Chronic obstructive pulmonary disease, unspecified: Secondary | ICD-10-CM | POA: Diagnosis not present

## 2020-01-26 DIAGNOSIS — R195 Other fecal abnormalities: Secondary | ICD-10-CM

## 2020-01-26 DIAGNOSIS — K7400 Hepatic fibrosis, unspecified: Secondary | ICD-10-CM

## 2020-01-26 DIAGNOSIS — Z8619 Personal history of other infectious and parasitic diseases: Secondary | ICD-10-CM

## 2020-01-26 NOTE — Progress Notes (Signed)
Cc'ed to pcp °

## 2020-01-26 NOTE — Assessment & Plan Note (Addendum)
S/p treatment in 2017 by Dr. Dulce Sellar.  HCVRNA completed 10/07/2019 confirmed SVR.  He had  Metavir fibrosis score F2/F3 in 2017.  Ultrasound in April 2021 with nodular hepatic contour but follow-up CT A/P and MRI abdomen did not mention hepatic nodularity.  Reviewed imaging with Dr. Tyron Russell, radiologist, who states findings on ultrasound were likely an overcall and it does not appear patient has cirrhosis.  He does not have any signs or symptoms of decompensated liver disease.  Furthermore, spleen is within normal limits, platelets within normal limits, LFTs within normal limits, INR 1.0.  We will plan for routine [place] to monitor.

## 2020-01-26 NOTE — Telephone Encounter (Signed)
[facility] Clinical Pool: Please let patient know I have reviewed prior imaging including [place], CT, and MRI with our radiologists Dr. Tyron Russell. Dr. Tyron Russell feels the possible cirrhosis on the [place] was an overcall and the other imaging does not suggest that patient has cirrhosis. We will not need an EGD at this time. He will only need to be scheduled for a colonoscopy.    He will need UDS at pre-op.

## 2020-01-26 NOTE — Assessment & Plan Note (Signed)
Addressed under hepatic fibrosis.

## 2020-01-26 NOTE — Telephone Encounter (Addendum)
Tried to call pt, no answer, LMOVM for return call.  Pt can be scheduled when RMR's September schedule is available.

## 2020-01-26 NOTE — Assessment & Plan Note (Signed)
74 year old male with history significant for diabetes, PAD, anxiety, depression, COPD, hep C s/p treatment 2017, F2/F3 fibrosis, substance abuse with last use about 3 years ago presenting to schedule colonoscopy due to positive fit test in March 2021.  No prior colonoscopy.  No significant upper or lower GI symptoms.  No alarm symptoms.  No family history of colon cancer.  Proceed with colonoscopy with propofol with Dr. Jena Gauss in the near future. The risks, benefits, and alternatives have been discussed in detail with patient. They have stated understanding and desire to proceed.  ASA III UDS at preop due to history of drug use. See separate instructions for diabetes medication adjustments. Follow-up after colonoscopy.

## 2020-01-26 NOTE — Assessment & Plan Note (Addendum)
74 year old male with history of hepatitis C in 2017 s/p treatment with Dr. Dulce Sellar.  Confirmed SVR in April 2021.  Prior ultrasound in 2017 with elastography with normal-appearing liver but Metavir fibrosis score F2/F3.  Repeat ultrasound in April 2021 with nodular hepatic contour with increased parenchymal echogenicity.  Interesting, patient has had CT A/P with contrast as well as MRI abdomen without contrast which do not suggest cirrhosis.  I reviewed all imaging with Dr. Tyron Russell, radiologist, who states it does not appear patient has cirrhosis.  Suspects report on ultrasound was an overcall.  Spleen is also appeared normal on these imaging studies.  Patient does not have any signs or symptoms of decompensated liver disease.  Furthermore, platelets, LFTs, INR have been within normal limits.  I had considered need for EGD if patient had evidence of cirrhosis.  At this point, we will hold off on EGD and continue with routine ultrasounds to monitor for any changes.  He will proceed with colonoscopy at this time to evaluate positive fit test as discussed below and follow-up in the office thereafter.

## 2020-01-26 NOTE — Patient Instructions (Signed)
Please have labs completed.   I am going to review your imaging studies with radiology prior to scheduling your procedures.   We will reach back out to you later this week with recommendations regarding colonoscopy and possible upper endoscopy.   Ermalinda Memos, PA-C [facility]

## 2020-01-27 NOTE — Telephone Encounter (Signed)
Pt came by office. Informed him of message from [facility]. He is aware we will call him to schedule TCS when September schedule is available.

## 2020-01-31 DIAGNOSIS — K7400 Hepatic fibrosis, unspecified: Secondary | ICD-10-CM | POA: Diagnosis not present

## 2020-01-31 DIAGNOSIS — Z8619 Personal history of other infectious and parasitic diseases: Secondary | ICD-10-CM | POA: Diagnosis not present

## 2020-02-01 LAB — HEPATITIS B SURFACE ANTIBODY,QUALITATIVE: Hep B S Ab: NONREACTIVE

## 2020-02-01 LAB — HEPATITIS A ANTIBODY, TOTAL: Hepatitis A AB,Total: NONREACTIVE

## 2020-02-01 NOTE — Progress Notes (Signed)
Patient does not have immunity to hepatitis A or B and will need immunization.

## 2020-02-07 ENCOUNTER — Encounter (INDEPENDENT_AMBULATORY_CARE_PROVIDER_SITE_OTHER): Payer: Self-pay

## 2020-02-07 ENCOUNTER — Telehealth: Payer: Self-pay | Admitting: *Deleted

## 2020-02-07 NOTE — Telephone Encounter (Signed)
LMOVM to schedule TCS with propofol with Dr. Rourk, ASA 3 

## 2020-02-14 ENCOUNTER — Telehealth: Payer: Self-pay | Admitting: Internal Medicine

## 2020-02-14 DIAGNOSIS — L603 Nail dystrophy: Secondary | ICD-10-CM | POA: Diagnosis not present

## 2020-02-14 DIAGNOSIS — L84 Corns and callosities: Secondary | ICD-10-CM | POA: Diagnosis not present

## 2020-02-14 DIAGNOSIS — I739 Peripheral vascular disease, unspecified: Secondary | ICD-10-CM | POA: Diagnosis not present

## 2020-02-14 DIAGNOSIS — E1151 Type 2 diabetes mellitus with diabetic peripheral angiopathy without gangrene: Secondary | ICD-10-CM | POA: Diagnosis not present

## 2020-02-14 NOTE — Telephone Encounter (Signed)
Called pt, informed him we will call when next schedule is available.

## 2020-02-14 NOTE — Telephone Encounter (Signed)
Pt received a letter from [place] to schedule his procedure, [phone]

## 2020-02-17 ENCOUNTER — Telehealth: Payer: Self-pay | Admitting: *Deleted

## 2020-02-17 NOTE — Telephone Encounter (Signed)
Called pt and he has been scheduled for TCS with propofol with Dr. Jena Gauss, ASA 3 on 10/18 at 8:30am. Patient aware will need pre-op/covid test prior. Advised will mail with prep instructions. Confirmed mailing address.   PA for TCS approved via [employer]. Auth# [id number] dates 04/17/20-05/17/2020

## 2020-02-18 ENCOUNTER — Encounter: Payer: Self-pay | Admitting: *Deleted

## 2020-03-09 ENCOUNTER — Other Ambulatory Visit (INDEPENDENT_AMBULATORY_CARE_PROVIDER_SITE_OTHER): Payer: Self-pay | Admitting: Internal Medicine

## 2020-04-03 ENCOUNTER — Other Ambulatory Visit: Payer: Self-pay | Admitting: Gastroenterology

## 2020-04-03 ENCOUNTER — Ambulatory Visit (INDEPENDENT_AMBULATORY_CARE_PROVIDER_SITE_OTHER): Payer: Medicare HMO

## 2020-04-03 ENCOUNTER — Other Ambulatory Visit (INDEPENDENT_AMBULATORY_CARE_PROVIDER_SITE_OTHER): Payer: Self-pay | Admitting: Internal Medicine

## 2020-04-03 DIAGNOSIS — Z791 Long term (current) use of non-steroidal anti-inflammatories (NSAID): Secondary | ICD-10-CM

## 2020-04-07 ENCOUNTER — Other Ambulatory Visit (INDEPENDENT_AMBULATORY_CARE_PROVIDER_SITE_OTHER): Payer: Self-pay | Admitting: Nurse Practitioner

## 2020-04-07 DIAGNOSIS — E119 Type 2 diabetes mellitus without complications: Secondary | ICD-10-CM

## 2020-04-10 ENCOUNTER — Ambulatory Visit (INDEPENDENT_AMBULATORY_CARE_PROVIDER_SITE_OTHER): Payer: Medicare HMO

## 2020-04-11 NOTE — Patient Instructions (Signed)
Alex Marks  04/11/2020     @PREFPERIOPPHARMACY @   Your procedure is scheduled on  04/17/2020.  Report to 04/19/2020 at  0700  A.M.  Call this number if you have problems the morning of surgery:  [phone]   Remember:  Follow the diet and prep instructions given to yo by the office.                      Take these medicines the morning of surgery with A SIP OF WATER  Amlodipine, prilosec.    Do not wear jewelry, make-up or nail polish.  Do not wear lotions, powders, or perfumes. Please wear deodorant and brush your teeth.  Do not shave 48 hours prior to surgery.  Men may shave face and neck.  Do not bring valuables to the hospital.  [facility] is not responsible for any belongings or valuables.  Contacts, dentures or bridgework may not be worn into surgery.  Leave your suitcase in the car.  After surgery it may be brought to your room.  For patients admitted to the hospital, discharge time will be determined by your treatment team.  Patients discharged the day of surgery will not be allowed to drive home.   Name and phone number of your driver:   family Special instructions:  DO NOT smoke the morning of your procedure.  Please read over the following fact sheets that you were given. Anesthesia Post-op Instructions and Care and Recovery After Surgery       Colonoscopy, Adult, Care After This sheet gives you information about how to care for yourself after your procedure. Your health care provider may also give you more specific instructions. If you have problems or questions, contact your health care provider. What can I expect after the procedure? After the procedure, it is common to have:  A small amount of blood in your stool for 24 hours after the procedure.  Some gas.  Mild cramping or bloating of your abdomen. Follow these instructions at home: Eating and drinking   Drink enough fluid to keep your urine pale yellow.  Follow instructions  from your health care provider about eating or drinking restrictions.  Resume your normal diet as instructed by your health care provider. Avoid heavy or fried foods that are hard to digest. Activity  Rest as told by your health care provider.  Avoid sitting for a long time without moving. Get up to take short walks every 1-2 hours. This is important to improve blood flow and breathing. Ask for help if you feel weak or unsteady.  Return to your normal activities as told by your health care provider. Ask your health care provider what activities are safe for you. Managing cramping and bloating   Try walking around when you have cramps or feel bloated.  Apply heat to your abdomen as told by your health care provider. Use the heat source that your health care provider recommends, such as a moist heat pack or a heating pad. ? Place a towel between your skin and the heat source. ? Leave the heat on for 20-30 minutes. ? Remove the heat if your skin turns bright red. This is especially important if you are unable to feel pain, heat, or cold. You may have a greater risk of getting burned. General instructions  For the first 24 hours after the procedure: ? Do not drive or use machinery. ? Do not sign important  documents. ? Do not drink alcohol. ? Do your regular daily activities at a slower pace than normal. ? Eat soft foods that are easy to digest.  Take over-the-counter and prescription medicines only as told by your health care provider.  Keep all follow-up visits as told by your health care provider. This is important. Contact a health care provider if:  You have blood in your stool 2-3 days after the procedure. Get help right away if you have:  More than a small spotting of blood in your stool.  Large blood clots in your stool.  Swelling of your abdomen.  Nausea or vomiting.  A fever.  Increasing pain in your abdomen that is not relieved with medicine. Summary  After the  procedure, it is common to have a small amount of blood in your stool. You may also have mild cramping and bloating of your abdomen.  For the first 24 hours after the procedure, do not drive or use machinery, sign important documents, or drink alcohol.  Get help right away if you have a lot of blood in your stool, nausea or vomiting, a fever, or increased pain in your abdomen. This information is not intended to replace advice given to you by your health care provider. Make sure you discuss any questions you have with your health care provider. Document Revised: 01/11/2019 Document Reviewed: 01/11/2019 Elsevier Patient Education  [place] After These instructions provide you with information about caring for yourself after your procedure. Your health care provider may also give you more specific instructions. Your treatment has been planned according to current medical practices, but problems sometimes occur. Call your health care provider if you have any problems or questions after your procedure. What can I expect after the procedure? After your procedure, you may:  Feel sleepy for several hours.  Feel clumsy and have poor balance for several hours.  Feel forgetful about what happened after the procedure.  Have poor judgment for several hours.  Feel nauseous or vomit.  Have a sore throat if you had a breathing tube during the procedure. Follow these instructions at home: For at least 24 hours after the procedure:      Have a responsible adult stay with you. It is important to have someone help care for you until you are awake and alert.  Rest as needed.  Do not: ? Participate in activities in which you could fall or become injured. ? Drive. ? Use heavy machinery. ? Drink alcohol. ? Take sleeping pills or medicines that cause drowsiness. ? Make important decisions or sign legal documents. ? Take care of children on your own. Eating  and drinking  Follow the diet that is recommended by your health care provider.  If you vomit, drink water, juice, or soup when you can drink without vomiting.  Make sure you have little or no nausea before eating solid foods. General instructions  Take over-the-counter and prescription medicines only as told by your health care provider.  If you have sleep apnea, surgery and certain medicines can increase your risk for breathing problems. Follow instructions from your health care provider about wearing your sleep device: ? Anytime you are sleeping, including during daytime naps. ? While taking prescription pain medicines, sleeping medicines, or medicines that make you drowsy.  If you smoke, do not smoke without supervision.  Keep all follow-up visits as told by your health care provider. This is important. Contact a health care provider if:  You keep feeling nauseous or you keep vomiting.  You feel light-headed.  You develop a rash.  You have a fever. Get help right away if:  You have trouble breathing. Summary  For several hours after your procedure, you may feel sleepy and have poor judgment.  Have a responsible adult stay with you for at least 24 hours or until you are awake and alert. This information is not intended to replace advice given to you by your health care provider. Make sure you discuss any questions you have with your health care provider. Document Revised: 09/15/2017 Document Reviewed: 10/08/2015 Elsevier Patient Education  [place].

## 2020-04-12 ENCOUNTER — Telehealth: Payer: Self-pay | Admitting: *Deleted

## 2020-04-12 ENCOUNTER — Encounter (INDEPENDENT_AMBULATORY_CARE_PROVIDER_SITE_OTHER): Payer: Self-pay | Admitting: Internal Medicine

## 2020-04-12 ENCOUNTER — Ambulatory Visit (INDEPENDENT_AMBULATORY_CARE_PROVIDER_SITE_OTHER): Payer: Medicare HMO | Admitting: Internal Medicine

## 2020-04-12 ENCOUNTER — Ambulatory Visit (INDEPENDENT_AMBULATORY_CARE_PROVIDER_SITE_OTHER): Payer: Medicare HMO

## 2020-04-12 ENCOUNTER — Other Ambulatory Visit: Payer: Self-pay

## 2020-04-12 VITALS — BP 118/78 | HR 64 | Temp 96.6°F | Ht 70.0 in | Wt 225.6 lb

## 2020-04-12 DIAGNOSIS — E782 Mixed hyperlipidemia: Secondary | ICD-10-CM

## 2020-04-12 DIAGNOSIS — E119 Type 2 diabetes mellitus without complications: Secondary | ICD-10-CM | POA: Diagnosis not present

## 2020-04-12 DIAGNOSIS — I10 Essential (primary) hypertension: Secondary | ICD-10-CM | POA: Diagnosis not present

## 2020-04-12 DIAGNOSIS — Z23 Encounter for immunization: Secondary | ICD-10-CM | POA: Diagnosis not present

## 2020-04-12 NOTE — Addendum Note (Signed)
Addended by: Ronita Hipps on: 04/12/2020 09:09 AM   Modules accepted: Orders

## 2020-04-12 NOTE — Telephone Encounter (Signed)
Noted. Procedure cancelled. Patient will call when ready to reschedule.

## 2020-04-12 NOTE — Telephone Encounter (Signed)
Noted  

## 2020-04-12 NOTE — Telephone Encounter (Signed)
-----   Message from Nobie Putnam sent at 04/12/2020 12:20 PM EDT ----- Elvina Sidle,  This patient called Britta Mccreedy & stated he wants to cancel and he will call the office when he is ready to reschedule.  He was on for Monday with Dr. Jena Gauss.  I am canceling.  Thanks, Eber Jones

## 2020-04-12 NOTE — Progress Notes (Signed)
Metrics: Intervention Frequency ACO  Documented Smoking Status Yearly  Screened one or more times in 24 months  Cessation Counseling or  Active cessation medication Past 24 months  Past 24 months   Guideline developer: UpToDate (See UpToDate for funding source) Date Released: 2014       Wellness Office Visit  Subjective:  Patient ID: Alex Marks, male    DOB: 1945/11/07  Age: 74 y.o. MRN: [id number]  CC: This man comes in for follow-up of hypertension diabetes and hyperlipidemia.  He has obesity. HPI His underlying issue is generalized anxiety disorder and depression which makes it difficult for him to stick to a nutritional plan or even exercise.  However, overall, he is doing somewhat better. He has never seen a psychiatrist recently for depression and nor does he want to because he has been tried on several antidepressant medications and side effects make it difficult for him to tolerate these medications. He continues on glipizide, Metformin and Invokana for his diabetes. He continues on atorvastatin for hyperlipidemia. He continues on losartan for hypertension as well as amlodipine. Denies any chest pain, dyspnea, palpitations or limb weakness.  Past Medical History:  Diagnosis Date  . Anxiety   . COPD (chronic obstructive pulmonary disease) ([place])   . Depression   . Depression   . Frequency of urination   . GAD (generalized anxiety disorder)   . GERD (gastroesophageal reflux disease)   . Hepatitis C    s/p treatment with Dr. Paulita Fujita in 2017. HCV RNA not detected in April 2021  . Hypercholesterolemia   . Hypertension   . PAD (peripheral artery disease) ([place])   . Scalp lesion 04/12/2019  . Substance abuse ([place])   . Transfusion of blood product refused for religious reason   . Type II diabetes mellitus ([place])    Past Surgical History:  Procedure Laterality Date  . ABDOMINAL AORTOGRAM W/LOWER EXTREMITY Bilateral 05/30/2017  . ABDOMINAL AORTOGRAM W/LOWER EXTREMITY N/A  05/30/2017   Procedure: ABDOMINAL AORTOGRAM W/LOWER EXTREMITY;  Surgeon: Elam Dutch, MD;  Location: [facility];  Service: Cardiovascular;  Laterality: N/A;  Bilateral     Family History  Problem Relation Age of Onset  . Alzheimer's disease Mother   . Mental illness Mother        Depression  . Breast cancer Mother   . Cancer Father        bladder?  [name] Hypertension Father   . Alcohol abuse Father   . Heart disease Sister   . Hearing loss Sister        valve  . Alcohol abuse Brother   . Diabetes Paternal Aunt   . COPD Sister   . Alcohol abuse Brother   . Alcohol abuse Brother   . Alcohol abuse Brother   . Early death Brother        MVA  . Cancer Maternal Grandmother   . Colon cancer Neg Hx   . Liver cancer Neg Hx     Social History   Social History Narrative   Single.Live in boarding house   Renting a room   looking for apartment   Never had license   Likes to read   Social History   Tobacco Use  . Smoking status: Current Every Day Smoker    Packs/day: 1.00    Years: 57.00    Pack years: 57.00    Types: Cigarettes    Start date: 07/01/1961  . Smokeless tobacco: Never Used  . Tobacco comment:  Has tried to quit, but his depressed mood makes it challenging to quit  Substance Use Topics  . Alcohol use: Not Currently    Comment: history of heavy alcohol use; last drank alcohol about 3 years ago    Current Meds  Medication Sig  . amLODipine (NORVASC) 10 MG tablet TAKE 1 TABLET(10 MG) BY MOUTH DAILY (Patient taking differently: Take 10 mg by mouth daily. )  . aspirin 325 MG EC tablet Take 1 tablet (325 mg total) by mouth daily. May take an additional 325 mgs as needed for sleep (Patient taking differently: Take 325 mg by mouth daily. )  . atorvastatin (LIPITOR) 10 MG tablet TAKE 1 TABLET(10 MG) BY MOUTH DAILY  . blood glucose meter kit and supplies KIT Dx   e11.9  . Cholecalciferol (VITAMIN D3) 125 MCG (5000 UT) TABS Take by mouth daily.   [name] glipiZIDE  (GLUCOTROL) 5 MG tablet TAKE 1 TABLET(5 MG) BY MOUTH TWICE DAILY BEFORE A MEAL (Patient taking differently: Take 5 mg by mouth 2 (two) times daily before a meal. )  . INVOKANA 100 MG TABS tablet TAKE 1 TABLET(100 MG) BY MOUTH DAILY BEFORE BREAKFAST  . loratadine (CLARITIN) 10 MG tablet Take 10 mg by mouth daily as needed for allergies.   [name] losartan (COZAAR) 100 MG tablet TAKE 1 TABLET(100 MG) BY MOUTH DAILY  . metFORMIN (GLUCOPHAGE) 500 MG tablet Take 1 tablet (500 mg total) by mouth daily with breakfast.  . Multiple Vitamin (MULTIVITAMIN WITH MINERALS) TABS tablet Take 1 tablet by mouth daily.  . Omega-3 Fatty Acids (FISH OIL PEARLS) 300 MG CAPS Take 600 mg by mouth 2 (two) times daily. (Patient taking differently: Take 300 mg by mouth daily. )  . omeprazole (PRILOSEC) 20 MG capsule TAKE 1 CAPSULE(20 MG) BY MOUTH DAILY  . vitamin C (ASCORBIC ACID) 500 MG tablet Take 500 mg by mouth daily.      Depression screen [facility] 2/9 11/18/2019 10/06/2019 07/08/2019 05/14/2017 02/11/2017  Decreased Interest 3 0 0 0 0  Down, Depressed, Hopeless 3 0 0 0 0  PHQ - 2 Score 6 0 0 0 0  Altered sleeping 3 - - - -  Tired, decreased energy 3 - - - -  Change in appetite 3 - - - -  Feeling bad or failure about yourself  3 - - - -  Trouble concentrating 3 - - - -  Moving slowly or fidgety/restless 1 - - - -  Suicidal thoughts 0 - - - -  PHQ-9 Score 22 - - - -     Objective:   Today's Vitals: BP 118/78   Pulse 64   Temp (!) 96.6 F (35.9 C) (Temporal)   Ht '5\' 10"'  (1.778 m)   Wt 225 lb 9.6 oz (102.3 kg)   SpO2 97%   BMI 32.37 kg/m  Vitals with BMI 04/12/2020 01/26/2020 01/11/2020  Height '5\' 10"'  '5\' 10"'  '5\' 10"'   Weight 225 lbs 10 oz 222 lbs 3 oz 220 lbs 13 oz  BMI 32.37 16.10 96.04  Systolic 540 981 191  Diastolic 78 79 79  Pulse 64 67 70     Physical Exam  His weight has increased from the last time I saw him by about 5 pounds.  Blood pressure is in good control.  He remains obese.     Assessment    1. Essential hypertension   2. Type 2 diabetes mellitus without complication, without long-term current use of insulin ([place])   3. Mixed  hyperlipidemia       Tests ordered Orders Placed This Encounter  Procedures  . COMPLETE METABOLIC PANEL WITH GFR  . Hemoglobin A1c  . Lipid panel     Plan: 1. Blood work is ordered. 2. He will continue with the same antihypertensive medications as before. 3. He will continue with oral hypoglycemic agents for diabetes and we will check an A1c today. 4. He will continue with statin therapy and we will check a lipid panel. 5. High-dose influenza vaccination was given today. 6. Follow-up with [name] in about 3 months time.   No orders of the defined types were placed in this encounter.   Doree Albee, MD

## 2020-04-13 ENCOUNTER — Other Ambulatory Visit (HOSPITAL_COMMUNITY): Payer: Medicare HMO

## 2020-04-13 ENCOUNTER — Encounter (HOSPITAL_COMMUNITY)
Admission: RE | Admit: 2020-04-13 | Discharge: 2020-04-13 | Disposition: A | Discharge status: Home / Self Care | Payer: Medicare HMO | Source: Ambulatory Visit | Attending: Internal Medicine | Admitting: Internal Medicine

## 2020-04-13 ENCOUNTER — Encounter (HOSPITAL_COMMUNITY): Payer: Self-pay

## 2020-04-13 LAB — COMPLETE METABOLIC PANEL WITH GFR
AG Ratio: 1.4 (calc) (ref 1.0–2.5)
ALT: 24 U/L (ref 9–46)
AST: 21 U/L (ref 10–35)
Albumin: 4.3 g/dL (ref 3.6–5.1)
Alkaline phosphatase (APISO): 114 U/L (ref 35–144)
BUN/Creatinine Ratio: 14 (calc) (ref 6–22)
BUN: 17 mg/dL (ref 7–25)
CO2: 23 mmol/L (ref 20–32)
Calcium: 9.4 mg/dL (ref 8.6–10.3)
Chloride: 105 mmol/L (ref 98–110)
Creat: 1.24 mg/dL — ABNORMAL HIGH (ref 0.70–1.18)
GFR, Est African American: 66 mL/min/{1.73_m2} (ref >=60)
GFR, Est Non African American: 57 mL/min/{1.73_m2} — ABNORMAL LOW (ref >=60)
Globulin: 3.1 g/dL (calc) (ref 1.9–3.7)
Glucose, Bld: 111 mg/dL — ABNORMAL HIGH (ref 65–99)
Potassium: 4.1 mmol/L (ref 3.5–5.3)
Sodium: 138 mmol/L (ref 135–146)
Total Bilirubin: 0.5 mg/dL (ref 0.2–1.2)
Total Protein: 7.4 g/dL (ref 6.1–8.1)

## 2020-04-13 LAB — LIPID PANEL
Cholesterol: 118 mg/dL (ref <200)
HDL: 32 mg/dL — ABNORMAL LOW (ref >=40)
LDL Cholesterol (Calc): 67 mg/dL (calc)
Non-HDL Cholesterol (Calc): 86 mg/dL (calc) (ref <130)
Total CHOL/HDL Ratio: 3.7 (calc) (ref <5.0)
Triglycerides: 105 mg/dL (ref <150)

## 2020-04-13 LAB — HEMOGLOBIN A1C
Hgb A1c MFr Bld: 7 % of total Hgb — ABNORMAL HIGH (ref <5.7)
Mean Plasma Glucose: 154 (calc)
eAG (mmol/L): 8.5 (calc)

## 2020-04-13 NOTE — Progress Notes (Signed)
Let the patient know that blood tests are stable.  Continue to work on diet.  Follow-up as scheduled.

## 2020-04-17 ENCOUNTER — Encounter (HOSPITAL_COMMUNITY): Admission: RE | Payer: Self-pay | Source: Home / Self Care

## 2020-04-17 ENCOUNTER — Ambulatory Visit (HOSPITAL_COMMUNITY): Admission: RE | Admit: 2020-04-17 | Payer: Medicare HMO | Source: Home / Self Care | Admitting: Internal Medicine

## 2020-04-17 SURGERY — COLONOSCOPY WITH PROPOFOL
Anesthesia: Monitor Anesthesia Care

## 2020-04-17 NOTE — Progress Notes (Signed)
Alex Marks talk to him via return phone call.

## 2020-04-17 NOTE — Progress Notes (Signed)
Note:

## 2020-06-21 DIAGNOSIS — E1151 Type 2 diabetes mellitus with diabetic peripheral angiopathy without gangrene: Secondary | ICD-10-CM | POA: Diagnosis not present

## 2020-06-21 DIAGNOSIS — L603 Nail dystrophy: Secondary | ICD-10-CM | POA: Diagnosis not present

## 2020-06-21 DIAGNOSIS — L84 Corns and callosities: Secondary | ICD-10-CM | POA: Diagnosis not present

## 2020-06-21 DIAGNOSIS — I739 Peripheral vascular disease, unspecified: Secondary | ICD-10-CM | POA: Diagnosis not present

## 2020-06-30 ENCOUNTER — Other Ambulatory Visit (INDEPENDENT_AMBULATORY_CARE_PROVIDER_SITE_OTHER): Payer: Self-pay | Admitting: Internal Medicine

## 2020-07-10 ENCOUNTER — Telehealth (INDEPENDENT_AMBULATORY_CARE_PROVIDER_SITE_OTHER): Payer: Self-pay

## 2020-07-10 NOTE — Telephone Encounter (Signed)
Either metformin or possibly omeprazole could be causing this.  He can discontinue omeprazole and see what happens.  He is due to see Maralyn Sago in approximately 9 days so he can address it with her on this appointment.

## 2020-07-10 NOTE — Telephone Encounter (Signed)
Called patient and gave him the message. Patient is going to stop taking the Omeprazole and let us know if he gets worse and will see [place] on 07/19/2020. Patient verbalized an understanding.

## 2020-07-10 NOTE — Telephone Encounter (Signed)
Patient called and stated that he started having diarrhea again and started about 3 to 4 days ago and not sure what it is coming from. Patient stated that he has been taking his medication for months and he is wondering if this is coming from the Omeprazole? Does he need to take this? He is not sure what to do. Patient stated that he is feeling fine and he is drinking lots of water and taking anti diarrhea tablets.   Please advise.

## 2020-07-11 ENCOUNTER — Other Ambulatory Visit (INDEPENDENT_AMBULATORY_CARE_PROVIDER_SITE_OTHER): Payer: Self-pay | Admitting: Internal Medicine

## 2020-07-11 ENCOUNTER — Telehealth (INDEPENDENT_AMBULATORY_CARE_PROVIDER_SITE_OTHER): Payer: Self-pay

## 2020-07-11 DIAGNOSIS — I10 Essential (primary) hypertension: Secondary | ICD-10-CM

## 2020-07-11 DIAGNOSIS — E119 Type 2 diabetes mellitus without complications: Secondary | ICD-10-CM

## 2020-07-11 DIAGNOSIS — Z791 Long term (current) use of non-steroidal anti-inflammatories (NSAID): Secondary | ICD-10-CM

## 2020-07-11 DIAGNOSIS — E782 Mixed hyperlipidemia: Secondary | ICD-10-CM

## 2020-07-11 MED ORDER — CANAGLIFLOZIN 100 MG PO TABS: 100.0000 mg | ORAL_TABLET | Freq: Every day | ORAL | 0 refills | Status: DC

## 2020-07-11 MED ORDER — LOSARTAN POTASSIUM 100 MG PO TABS: 100.0000 mg | ORAL_TABLET | Freq: Every day | ORAL | 0 refills | Status: DC

## 2020-07-11 MED ORDER — METFORMIN HCL 500 MG PO TABS: 500.0000 mg | ORAL_TABLET | Freq: Every day | ORAL | 1 refills | Status: DC

## 2020-07-11 MED ORDER — ATORVASTATIN CALCIUM 10 MG PO TABS: 10.0000 mg | ORAL_TABLET | Freq: Every day | ORAL | 0 refills | Status: DC

## 2020-07-11 MED ORDER — AMLODIPINE BESYLATE 10 MG PO TABS: 10.0000 mg | ORAL_TABLET | Freq: Every day | ORAL | 0 refills | Status: DC

## 2020-07-11 MED ORDER — GLIPIZIDE 5 MG PO TABS: 5.0000 mg | ORAL_TABLET | Freq: Two times a day (BID) | ORAL | 3 refills | Status: DC

## 2020-07-11 MED ORDER — OMEPRAZOLE 20 MG PO CPDR: 20.0000 mg | DELAYED_RELEASE_CAPSULE | Freq: Every day | ORAL | 5 refills | Status: DC

## 2020-07-11 NOTE — Telephone Encounter (Signed)
Judeth Cornfield with [facility] pharmacy called and requested the following refills of medication for 90 days:  amLODipine (NORVASC) 10 MG tablet  Last filled 06/30/2020, # 90 with 0 refills  atorvastatin (LIPITOR) 10 MG tablet  Last filled 04/08/2020, # 90 with 0 refills  glipiZIDE (GLUCOTROL) 5 MG tablet Last filled 03/09/2020, # 60 with 3 refills  INVOKANA 100 MG TABS tablet  Last filled 04/08/2020, # 90 with 0 refills  losartan (COZAAR) 100 MG tablet  Last filled 04/08/2020, # 90 with 0 refills  metFORMIN (GLUCOPHAGE) 500 MG tablet  Last filled 09/02/2019, # 180 with 3 refills  omeprazole (PRILOSEC) 20 MG capsule Last filled 04/07/2020, # 30 with 5 refills  Thank you.

## 2020-07-12 ENCOUNTER — Other Ambulatory Visit (INDEPENDENT_AMBULATORY_CARE_PROVIDER_SITE_OTHER): Payer: Self-pay | Admitting: Internal Medicine

## 2020-07-12 ENCOUNTER — Other Ambulatory Visit (INDEPENDENT_AMBULATORY_CARE_PROVIDER_SITE_OTHER): Payer: Self-pay

## 2020-07-12 DIAGNOSIS — Z791 Long term (current) use of non-steroidal anti-inflammatories (NSAID): Secondary | ICD-10-CM

## 2020-07-12 DIAGNOSIS — I10 Essential (primary) hypertension: Secondary | ICD-10-CM

## 2020-07-12 DIAGNOSIS — E782 Mixed hyperlipidemia: Secondary | ICD-10-CM

## 2020-07-12 DIAGNOSIS — E119 Type 2 diabetes mellitus without complications: Secondary | ICD-10-CM

## 2020-07-12 MED ORDER — METFORMIN HCL 500 MG PO TABS: 500.0000 mg | ORAL_TABLET | Freq: Every day | ORAL | 1 refills | Status: DC

## 2020-07-12 MED ORDER — OMEPRAZOLE 20 MG PO CPDR
20.0000 mg | DELAYED_RELEASE_CAPSULE | Freq: Every day | ORAL | 1 refills | Status: DC
Start: 2020-07-12 — End: 2020-07-19

## 2020-07-12 MED ORDER — LOSARTAN POTASSIUM 100 MG PO TABS: 100.0000 mg | ORAL_TABLET | Freq: Every day | ORAL | 0 refills | Status: DC

## 2020-07-12 MED ORDER — OMEPRAZOLE 20 MG PO CPDR: 20.0000 mg | DELAYED_RELEASE_CAPSULE | Freq: Every day | ORAL | 1 refills | Status: DC

## 2020-07-12 MED ORDER — CANAGLIFLOZIN 100 MG PO TABS: 100.0000 mg | ORAL_TABLET | Freq: Every day | ORAL | 0 refills | Status: DC

## 2020-07-12 MED ORDER — GLIPIZIDE 5 MG PO TABS: 5.0000 mg | ORAL_TABLET | Freq: Two times a day (BID) | ORAL | 1 refills | Status: DC

## 2020-07-12 MED ORDER — AMLODIPINE BESYLATE 10 MG PO TABS: 10.0000 mg | ORAL_TABLET | Freq: Every day | ORAL | 0 refills | Status: DC

## 2020-07-12 NOTE — Telephone Encounter (Signed)
Received a call requesting 90 days for the following medications:  glipiZIDE (GLUCOTROL) 5 MG tablet   omeprazole (PRILOSEC) 20 MG capsule   These 2 medications were sent for 30 days and not 90 days yesterday.

## 2020-07-13 ENCOUNTER — Telehealth (INDEPENDENT_AMBULATORY_CARE_PROVIDER_SITE_OTHER): Payer: Self-pay

## 2020-07-13 NOTE — Telephone Encounter (Signed)
He should be okay to wait.

## 2020-07-13 NOTE — Telephone Encounter (Signed)
Called patient and gave him the message from Dr. Gosrani. Patient verbalized an understanding. 

## 2020-07-13 NOTE — Telephone Encounter (Signed)
Patient called and stated that he is out of his Invokana and [name] may not have this delivered to him until next week. Patient wants to know if he will be okay to wait until it comes in next week or if he needs to have a few pills sent in to [facility] for delivery? He has to walk to [facility] and he does not have much money and states it is a long walk and cold outside.  Please advise if he is okay to wait until next week or if he can have about 10 pills sent to [facility]?

## 2020-07-15 ENCOUNTER — Other Ambulatory Visit (INDEPENDENT_AMBULATORY_CARE_PROVIDER_SITE_OTHER): Payer: Self-pay | Admitting: Internal Medicine

## 2020-07-16 ENCOUNTER — Other Ambulatory Visit (INDEPENDENT_AMBULATORY_CARE_PROVIDER_SITE_OTHER): Payer: Self-pay | Admitting: Internal Medicine

## 2020-07-16 DIAGNOSIS — E119 Type 2 diabetes mellitus without complications: Secondary | ICD-10-CM

## 2020-07-19 ENCOUNTER — Telehealth (INDEPENDENT_AMBULATORY_CARE_PROVIDER_SITE_OTHER): Payer: Self-pay | Admitting: Nurse Practitioner

## 2020-07-19 ENCOUNTER — Other Ambulatory Visit: Payer: Self-pay

## 2020-07-19 ENCOUNTER — Encounter (INDEPENDENT_AMBULATORY_CARE_PROVIDER_SITE_OTHER): Payer: Self-pay | Admitting: Nurse Practitioner

## 2020-07-19 ENCOUNTER — Ambulatory Visit (INDEPENDENT_AMBULATORY_CARE_PROVIDER_SITE_OTHER): Payer: Medicare HMO | Admitting: Nurse Practitioner

## 2020-07-19 VITALS — BP 126/82 | HR 78 | Temp 96.9°F | Ht 70.0 in | Wt 232.6 lb

## 2020-07-19 DIAGNOSIS — Z122 Encounter for screening for malignant neoplasm of respiratory organs: Secondary | ICD-10-CM

## 2020-07-19 DIAGNOSIS — Z1211 Encounter for screening for malignant neoplasm of colon: Secondary | ICD-10-CM | POA: Diagnosis not present

## 2020-07-19 DIAGNOSIS — E782 Mixed hyperlipidemia: Secondary | ICD-10-CM

## 2020-07-19 DIAGNOSIS — R911 Solitary pulmonary nodule: Secondary | ICD-10-CM

## 2020-07-19 DIAGNOSIS — E119 Type 2 diabetes mellitus without complications: Secondary | ICD-10-CM | POA: Diagnosis not present

## 2020-07-19 DIAGNOSIS — E559 Vitamin D deficiency, unspecified: Secondary | ICD-10-CM | POA: Diagnosis not present

## 2020-07-19 DIAGNOSIS — I10 Essential (primary) hypertension: Secondary | ICD-10-CM | POA: Diagnosis not present

## 2020-07-19 DIAGNOSIS — R5383 Other fatigue: Secondary | ICD-10-CM

## 2020-07-19 NOTE — Telephone Encounter (Signed)
Please try to get CT scan of chest approved by insurance and get this scheduled. Also I have referred patient to GI for colonoscopy. He would like to see Dr. Kendell Bane. Thank you.

## 2020-07-19 NOTE — Progress Notes (Signed)
Subjective:  Patient ID: Alex Marks, male    DOB: 22-Sep-1945  Age: 75 y.o. MRN: [id number]  CC:  Chief Complaint  Patient presents with  . Follow-up    Patient stopped taking the Omeprazole because it made him have diarrhea and since he stopped he is better, eyes were burning this morning  . Diabetes  . Hypertension  . Hyperlipidemia  . Other    Vitamin D deficiency, health maintenance      HPI  This patient arrives today for the above.  Diabetes: Last A1c was 7.0.  He was positive for microalbuminuria in March 2021.  He continues on metformin, glipizide, Invokana.  He is on losartan and atorvastatin.  Up-to-date with foot exam.  Hypertension: Continues on losartan and amlodipine, and is tolerating his medications well.  Hyperlipidemia: He continues on aspirin, atorvastatin, and omega-3.  Last panel was collected approximately 2 months ago and LDL was 67.  Vitamin D deficiency: He continues on 5,000 IUs of vitamin D3 daily.  He is due for serum check today.  Health maintenance: He was told to follow-up with gastroenterology for colon cancer screening when he had a positive fit test.  He tells me he didn't go through with the colonoscopy because he didn't like having to do the prep.  He also will be due to have CT scan for monitoring of his lung nodules and for lung cancer screening later on this month.  Of note, he did stop his omeprazole which she was taking for heartburn.  This seems to result in resolved diarrhea.  He tells me he has not had significant amounts of heartburn since last office visit.  He tells me he has had to take an Alka-Seltzer 1 time for his heartburn since stopping the omeprazole.  He also mentions that he feels quite fatigued and doesn't have any motivation to do anything around his house.  He is wondering if he could have assistance with managing his home especially with taking out trash.    Past Medical History:  Diagnosis Date  . Anxiety   .  COPD (chronic obstructive pulmonary disease) ([place])   . Depression   . Depression   . Frequency of urination   . GAD (generalized anxiety disorder)   . GERD (gastroesophageal reflux disease)   . Hepatitis C    s/p treatment with Dr. Paulita Fujita in 2017. HCV RNA not detected in April 2021  . Hypercholesterolemia   . Hypertension   . PAD (peripheral artery disease) ([place])   . Scalp lesion 04/12/2019  . Substance abuse ([place])   . Transfusion of blood product refused for religious reason   . Type II diabetes mellitus ([facility])       Family History  Problem Relation Age of Onset  . Alzheimer's disease Mother   . Mental illness Mother        Depression  . Breast cancer Mother   . Cancer Father        bladder?  [name] Hypertension Father   . Alcohol abuse Father   . Heart disease Sister   . Hearing loss Sister        valve  . Alcohol abuse Brother   . Diabetes Paternal Aunt   . COPD Sister   . Alcohol abuse Brother   . Alcohol abuse Brother   . Alcohol abuse Brother   . Early death Brother        MVA  . Cancer Maternal Grandmother   .  Colon cancer Neg Hx   . Liver cancer Neg Hx     Social History   Social History Narrative   Single.Live in boarding house   Renting a room   looking for apartment   Never had license   Likes to read   Social History   Tobacco Use  . Smoking status: Current Every Day Smoker    Packs/day: 1.00    Years: 57.00    Pack years: 57.00    Types: Cigarettes    Start date: 07/01/1961  . Smokeless tobacco: Never Used  . Tobacco comment: Has tried to quit, but his depressed mood makes it challenging to quit  Substance Use Topics  . Alcohol use: Not Currently    Comment: history of heavy alcohol use; last drank alcohol about 3 years ago     Current Meds  Medication Sig  . amLODipine (NORVASC) 10 MG tablet Take 1 tablet (10 mg total) by mouth daily.  [name] aspirin 325 MG EC tablet Take 1 tablet (325 mg total) by mouth daily. May take an additional 325 mgs  as needed for sleep (Patient taking differently: Take 325 mg by mouth daily.)  . atorvastatin (LIPITOR) 10 MG tablet TAKE 1 TABLET(10 MG) BY MOUTH DAILY  . blood glucose meter kit and supplies KIT Dx   e11.9  . Cholecalciferol (VITAMIN D3) 125 MCG (5000 UT) TABS Take by mouth daily.   [name] glipiZIDE (GLUCOTROL) 5 MG tablet Take 1 tablet (5 mg total) by mouth 2 (two) times daily before a meal.  . INVOKANA 100 MG TABS tablet TAKE 1 TABLET(100 MG) BY MOUTH DAILY BEFORE BREAKFAST  . loratadine (CLARITIN) 10 MG tablet Take 10 mg by mouth daily as needed for allergies.   [name] losartan (COZAAR) 100 MG tablet Take 1 tablet (100 mg total) by mouth daily.  . metFORMIN (GLUCOPHAGE) 500 MG tablet Take 1 tablet (500 mg total) by mouth daily with breakfast.  . Multiple Vitamin (MULTIVITAMIN WITH MINERALS) TABS tablet Take 1 tablet by mouth daily.  . Omega-3 Fatty Acids (FISH OIL PEARLS) 300 MG CAPS Take 600 mg by mouth 2 (two) times daily. (Patient taking differently: Take 300 mg by mouth daily.)  . tadalafil (CIALIS) 20 MG tablet Take 0.5 tablets (10 mg total) by mouth every other day as needed for erectile dysfunction.  . vitamin C (ASCORBIC ACID) 500 MG tablet Take 500 mg by mouth daily.    ROS:  Review of Systems  Constitutional: Positive for malaise/fatigue. Negative for weight loss.  Respiratory: Negative for shortness of breath.   Cardiovascular: Negative for chest pain.  Gastrointestinal: Negative for abdominal pain, blood in stool and heartburn.  Neurological: Negative for dizziness and headaches.     Objective:   Today's Vitals: BP 126/82   Pulse 78   Temp (!) 96.9 F (36.1 C) (Temporal)   Ht '5\' 10"'  (1.778 m)   Wt 232 lb 9.6 oz (105.5 kg)   SpO2 96%   BMI 33.37 kg/m  Vitals with BMI 07/19/2020 04/12/2020 01/26/2020  Height '5\' 10"'  '5\' 10"'  '5\' 10"'   Weight 232 lbs 10 oz 225 lbs 10 oz 222 lbs 3 oz  BMI 33.37 73.53 29.92  Systolic 426 834 196  Diastolic 82 78 79  Pulse 78 64 67     Physical  Exam Vitals reviewed.  Constitutional:      Appearance: Normal appearance.  HENT:     Head: Normocephalic and atraumatic.  Cardiovascular:     Rate and Rhythm:  Normal rate and regular rhythm.  Pulmonary:     Effort: Pulmonary effort is normal.     Breath sounds: Normal breath sounds.  Musculoskeletal:     Cervical back: Neck supple.  Skin:    General: Skin is warm and dry.  Neurological:     Mental Status: He is alert and oriented to person, place, and time.  Psychiatric:        Mood and Affect: Mood normal.        Behavior: Behavior normal.        Thought Content: Thought content normal.        Judgment: Judgment normal.          Assessment and Plan   1. Colon cancer screening   2. Essential hypertension   3. Type 2 diabetes mellitus without complication, without long-term current use of insulin ([place])   4. Vitamin D deficiency   5. Fatigue, unspecified type   6. Lung nodule   7. Encounter for screening for lung cancer   8. Mixed hyperlipidemia      Plan: 1.  We had a long conversation regarding recommendation to undergo colonoscopy as his fit test was positive.  He tells me he understands and is willing to consider undergoing colonoscopy.  We will refer him back to gastroenterology. 2.  Blood pressure acceptable on current regimen, he'll continue taking his medications as prescribed. 3.  We'll check A1c today for further evaluation, in the meantime we'll continue taking his medication as prescribed. 4.  We'll check serum vitamin D level today. 5.  We'll check blood work for further evaluation.  Patient is requesting assistance with his IADLs in the home.  Will refer to social work to see if he would qualify for help with this. 6.,  7.  Had a risk versus benefit discussion regarding repeating CT scan of lungs to monitor lung nodules and to screen for cancer.  He would like to proceed with CT scan.  We'll order this today. E.  He'll continue on his medications as  prescribed for his hyperlipidemia.   Tests ordered Orders Placed This Encounter  Procedures  . CT Chest Wo Contrast  . CMP with eGFR([facility])  . Hemoglobin A1c  . Vitamin D, 25-hydroxy  . TSH  . T3, Free  . T4, Free  . CBC with Differential/Platelets  . Ambulatory referral to Gastroenterology  . Ambulatory referral to Social Work      No orders of the defined types were placed in this encounter.   Patient to follow-up in 3 months  Ailene Ards, NP

## 2020-07-20 ENCOUNTER — Other Ambulatory Visit (INDEPENDENT_AMBULATORY_CARE_PROVIDER_SITE_OTHER): Payer: Self-pay

## 2020-07-20 DIAGNOSIS — E119 Type 2 diabetes mellitus without complications: Secondary | ICD-10-CM

## 2020-07-20 LAB — COMPLETE METABOLIC PANEL WITH GFR
AG Ratio: 1.4 (calc) (ref 1.0–2.5)
ALT: 26 U/L (ref 9–46)
AST: 22 U/L (ref 10–35)
Albumin: 4.3 g/dL (ref 3.6–5.1)
Alkaline phosphatase (APISO): 123 U/L (ref 35–144)
BUN/Creatinine Ratio: 11 (calc) (ref 6–22)
BUN: 13 mg/dL (ref 7–25)
CO2: 25 mmol/L (ref 20–32)
Calcium: 9.9 mg/dL (ref 8.6–10.3)
Chloride: 106 mmol/L (ref 98–110)
Creat: 1.2 mg/dL — ABNORMAL HIGH (ref 0.70–1.18)
GFR, Est African American: 69 mL/min/{1.73_m2} (ref >=60)
GFR, Est Non African American: 60 mL/min/{1.73_m2} (ref >=60)
Globulin: 3.1 g/dL (calc) (ref 1.9–3.7)
Glucose, Bld: 112 mg/dL — ABNORMAL HIGH (ref 65–99)
Potassium: 4.1 mmol/L (ref 3.5–5.3)
Sodium: 138 mmol/L (ref 135–146)
Total Bilirubin: 0.6 mg/dL (ref 0.2–1.2)
Total Protein: 7.4 g/dL (ref 6.1–8.1)

## 2020-07-20 LAB — CBC WITH DIFFERENTIAL/PLATELET
Absolute Monocytes: 748 cells/uL (ref 200–950)
Basophils Absolute: 55 cells/uL (ref 0–200)
Basophils Relative: 0.5 %
Eosinophils Absolute: 198 cells/uL (ref 15–500)
Eosinophils Relative: 1.8 %
HCT: 47.7 % (ref 38.5–50.0)
Hemoglobin: 16.3 g/dL (ref 13.2–17.1)
Lymphs Abs: 2684 cells/uL (ref 850–3900)
MCH: 29.7 pg (ref 27.0–33.0)
MCHC: 34.2 g/dL (ref 32.0–36.0)
MCV: 87 fL (ref 80.0–100.0)
MPV: 11.7 fL (ref 7.5–12.5)
Monocytes Relative: 6.8 %
Neutro Abs: 7315 cells/uL (ref 1500–7800)
Neutrophils Relative %: 66.5 %
Platelets: 229 10*3/uL (ref 140–400)
RBC: 5.48 10*6/uL (ref 4.20–5.80)
RDW: 13 % (ref 11.0–15.0)
Total Lymphocyte: 24.4 %
WBC: 11 10*3/uL — ABNORMAL HIGH (ref 3.8–10.8)

## 2020-07-20 LAB — VITAMIN D 25 HYDROXY (VIT D DEFICIENCY, FRACTURES): Vit D, 25-Hydroxy: 48 ng/mL (ref 30–100)

## 2020-07-20 LAB — T3, FREE: T3, Free: 3.7 pg/mL (ref 2.3–4.2)

## 2020-07-20 LAB — HEMOGLOBIN A1C
Hgb A1c MFr Bld: 6.9 % of total Hgb — ABNORMAL HIGH (ref <5.7)
Mean Plasma Glucose: 151 mg/dL
eAG (mmol/L): 8.4 mmol/L

## 2020-07-20 LAB — TSH: TSH: 2.36 mIU/L (ref 0.40–4.50)

## 2020-07-20 LAB — T4, FREE: Free T4: 1.1 ng/dL (ref 0.8–1.8)

## 2020-07-20 NOTE — Progress Notes (Signed)
To work up [employer]; pt is due for a yearly eye exam.

## 2020-07-26 ENCOUNTER — Encounter: Payer: Self-pay | Admitting: Internal Medicine

## 2020-08-01 ENCOUNTER — Telehealth (INDEPENDENT_AMBULATORY_CARE_PROVIDER_SITE_OTHER): Payer: Self-pay

## 2020-08-01 NOTE — Telephone Encounter (Signed)
[employer] Social services  called. Wanted to make sure what type of services you was needing for him. So explain from OV note. They do have a great deal of resources for him and will start the process. Thank you for the referral.

## 2020-08-09 ENCOUNTER — Ambulatory Visit (INDEPENDENT_AMBULATORY_CARE_PROVIDER_SITE_OTHER): Payer: Medicare HMO | Admitting: Internal Medicine

## 2020-08-09 ENCOUNTER — Other Ambulatory Visit: Payer: Self-pay

## 2020-08-09 ENCOUNTER — Encounter (INDEPENDENT_AMBULATORY_CARE_PROVIDER_SITE_OTHER): Payer: Self-pay | Admitting: Internal Medicine

## 2020-08-09 VITALS — Temp 97.7°F | Ht 70.0 in | Wt 234.0 lb

## 2020-08-09 DIAGNOSIS — F320 Major depressive disorder, single episode, mild: Secondary | ICD-10-CM

## 2020-08-09 MED ORDER — ESCITALOPRAM OXALATE 5 MG PO TABS: 5.0000 mg | ORAL_TABLET | Freq: Every day | ORAL | 3 refills | Status: DC

## 2020-08-09 NOTE — Progress Notes (Signed)
Metrics: Intervention Frequency ACO  Documented Smoking Status Yearly  Screened one or more times in 24 months  Cessation Counseling or  Active cessation medication Past 24 months  Past 24 months   Guideline developer: UpToDate (See UpToDate for funding source) Date Released: 2014       Wellness Office Visit  Subjective:  Patient ID: Alex Marks, male    DOB: [birth year]  Age: 75 y.o. MRN: [id number]  CC: Depression. HPI  This man comes in because he is undergoing depression and he has had a history of this but feels that he needs some medication for it.  He is not suicidal whatsoever but feels extremely low in mood and does not seem to be able to shake it. Past Medical History:  Diagnosis Date  . Anxiety   . COPD (chronic obstructive pulmonary disease) ([place])   . Depression   . Depression   . Frequency of urination   . GAD (generalized anxiety disorder)   . GERD (gastroesophageal reflux disease)   . Hepatitis C    s/p treatment with Dr. Paulita Fujita in 2017. HCV RNA not detected in April 2021  . Hypercholesterolemia   . Hypertension   . PAD (peripheral artery disease) ([place])   . Scalp lesion 04/12/2019  . Substance abuse ([place])   . Transfusion of blood product refused for religious reason   . Type II diabetes mellitus ([place])    Past Surgical History:  Procedure Laterality Date  . ABDOMINAL AORTOGRAM W/LOWER EXTREMITY Bilateral 05/30/2017  . ABDOMINAL AORTOGRAM W/LOWER EXTREMITY N/A 05/30/2017   Procedure: ABDOMINAL AORTOGRAM W/LOWER EXTREMITY;  Surgeon: Elam Dutch, MD;  Location: [facility];  Service: Cardiovascular;  Laterality: N/A;  Bilateral     Family History  Problem Relation Age of Onset  . Alzheimer's disease Mother   . Mental illness Mother        Depression  . Breast cancer Mother   . Cancer Father        bladder?  [name] Hypertension Father   . Alcohol abuse Father   . Heart disease Sister   . Hearing loss Sister        valve  . Alcohol abuse  Brother   . Diabetes Paternal Aunt   . COPD Sister   . Alcohol abuse Brother   . Alcohol abuse Brother   . Alcohol abuse Brother   . Early death Brother        MVA  . Cancer Maternal Grandmother   . Colon cancer Neg Hx   . Liver cancer Neg Hx     Social History   Social History Narrative   Single.Live in boarding house   Renting a room   looking for apartment   Never had license   Likes to read   Social History   Tobacco Use  . Smoking status: Current Every Day Smoker    Packs/day: 1.00    Years: 57.00    Pack years: 57.00    Types: Cigarettes    Start date: 07/01/1961  . Smokeless tobacco: Never Used  . Tobacco comment: Has tried to quit, but his depressed mood makes it challenging to quit  Substance Use Topics  . Alcohol use: Not Currently    Comment: history of heavy alcohol use; last drank alcohol about 3 years ago    Current Meds  Medication Sig  . amLODipine (NORVASC) 10 MG tablet Take 1 tablet (10 mg total) by mouth daily.  [name] aspirin 325 MG EC  tablet Take 1 tablet (325 mg total) by mouth daily. May take an additional 325 mgs as needed for sleep (Patient taking differently: Take 325 mg by mouth daily.)  . atorvastatin (LIPITOR) 10 MG tablet TAKE 1 TABLET(10 MG) BY MOUTH DAILY  . blood glucose meter kit and supplies KIT Dx   e11.9  . Cholecalciferol (VITAMIN D3) 125 MCG (5000 UT) TABS Take by mouth daily.   [name] escitalopram (LEXAPRO) 5 MG tablet Take 1 tablet (5 mg total) by mouth daily.  . fexofenadine (ALLEGRA) 180 MG tablet Take 180 mg by mouth daily.  [name] glipiZIDE (GLUCOTROL) 5 MG tablet Take 1 tablet (5 mg total) by mouth 2 (two) times daily before a meal.  . INVOKANA 100 MG TABS tablet TAKE 1 TABLET(100 MG) BY MOUTH DAILY BEFORE BREAKFAST  . losartan (COZAAR) 100 MG tablet Take 1 tablet (100 mg total) by mouth daily.  . metFORMIN (GLUCOPHAGE) 500 MG tablet Take 1 tablet (500 mg total) by mouth daily with breakfast.  . Multiple Vitamin (MULTIVITAMIN WITH  MINERALS) TABS tablet Take 1 tablet by mouth daily.  . Omega-3 Fatty Acids (FISH OIL PEARLS) 300 MG CAPS Take 600 mg by mouth 2 (two) times daily. (Patient taking differently: Take 300 mg by mouth daily.)  . tadalafil (CIALIS) 20 MG tablet Take 0.5 tablets (10 mg total) by mouth every other day as needed for erectile dysfunction.  . vitamin C (ASCORBIC ACID) 500 MG tablet Take 500 mg by mouth daily.  . [DISCONTINUED] loratadine (CLARITIN) 10 MG tablet Take 10 mg by mouth daily as needed for allergies.      Marland KitchenPHQ9  Objective:   Today's Vitals: Temp 97.7 F (36.5 C) (Temporal)   Ht '5\' 10"'  (1.778 m)   Wt 234 lb (106.1 kg)   BMI 33.58 kg/m  Vitals with BMI 08/09/2020 07/19/2020 04/12/2020  Height '5\' 10"'  '5\' 10"'  '5\' 10"'   Weight 234 lbs 232 lbs 10 oz 225 lbs 10 oz  BMI 33.58 23.95 32.02  Systolic - 334 356  Diastolic - 82 78  Pulse - 78 64     Physical Exam   He looks systemically well.  Flat affect.    Assessment   1. Current mild episode of major depressive disorder, unspecified whether recurrent ([place])       Tests ordered No orders of the defined types were placed in this encounter.    Plan: 1. We will try him on Lexapro at a low dose 5 mg daily and I have warned him of possible side effects. 2. He will follow-up with [name] in about 2 months and if he is not improving, either we can increase the dose or at that point refer him to psychiatry.   Meds ordered this encounter  Medications  . escitalopram (LEXAPRO) 5 MG tablet    Sig: Take 1 tablet (5 mg total) by mouth daily.    Dispense:  30 tablet    Refill:  3    Malie Kashani Luther Parody, MD

## 2020-08-15 ENCOUNTER — Ambulatory Visit (HOSPITAL_COMMUNITY): Payer: Medicare HMO

## 2020-08-30 ENCOUNTER — Other Ambulatory Visit: Payer: Self-pay | Admitting: Urology

## 2020-08-30 DIAGNOSIS — R350 Frequency of micturition: Secondary | ICD-10-CM

## 2020-09-05 ENCOUNTER — Telehealth (INDEPENDENT_AMBULATORY_CARE_PROVIDER_SITE_OTHER): Payer: Self-pay

## 2020-09-05 NOTE — Telephone Encounter (Signed)
Called patient and left a detailed voice message to let patient know that we received a fax from [facility] for a refill request of his Escitalopram 5 mg tablet. Patient has a follow up in April before we can send in 90 days and will discuss once patient calls back.

## 2020-09-05 NOTE — Telephone Encounter (Signed)
Patient called and stated that he is feeling better on this medication and he may need to increase the dosage but he will wait until his appointment in April with Sarah and wants to have sent to [facility] for 90 days if possible. Will discuss at visit. Sending as Lorain Childes.

## 2020-09-06 ENCOUNTER — Other Ambulatory Visit: Payer: Self-pay

## 2020-09-06 ENCOUNTER — Encounter: Payer: Self-pay | Admitting: Nurse Practitioner

## 2020-09-06 ENCOUNTER — Ambulatory Visit (INDEPENDENT_AMBULATORY_CARE_PROVIDER_SITE_OTHER): Payer: Medicare HMO | Admitting: Nurse Practitioner

## 2020-09-06 DIAGNOSIS — Z0000 Encounter for general adult medical examination without abnormal findings: Secondary | ICD-10-CM

## 2020-09-06 DIAGNOSIS — J449 Chronic obstructive pulmonary disease, unspecified: Secondary | ICD-10-CM | POA: Diagnosis not present

## 2020-09-06 NOTE — Progress Notes (Signed)
Cc'ed to pcp °

## 2020-09-06 NOTE — Patient Instructions (Signed)
Your health issues we discussed today were:   Need for colonoscopy: 1. We will schedule your colonoscopy for you 2. As discussed, we will make changes to your medications for 1 to 2 days prior to your procedure 3. Further recommendations will follow your colonoscopy 4. Call us if you have any problems or questions about your prep  Overall I recommend:  1. Continue other current medications 2. Return for follow-up based on recommendations made after your procedure 3. Call us for any questions or concerns   ---------------------------------------------------------------  I am glad you have gotten your COVID-19 vaccination!  Even though you are fully vaccinated you should continue to follow CDC and state/local guidelines.  ---------------------------------------------------------------   At [facility] we value your feedback. You may receive a survey about your visit today. Please share your experience as we strive to create trusting relationships with our patients to provide genuine, compassionate, quality care.  We appreciate your understanding and patience as we review any laboratory studies, imaging, and other diagnostic tests that are ordered as we care for you. Our office policy is 5 business days for review of these results, and any emergent or urgent results are addressed in a timely manner for your best interest. If you do not hear from our office in 1 week, please contact us.   We also encourage the use of MyChart, which contains your medical information for your review as well. If you are not enrolled in this feature, an access code is on this after visit summary for your convenience. Thank you for allowing [place] to be involved in your care.  It was great to see you today!  I hope you have a great spring!!

## 2020-09-06 NOTE — Progress Notes (Signed)
Referring Provider: Doree Albee, MD Primary Care Physician:  Ailene Ards, NP Primary GI:  Dr. Gala Romney  Chief Complaint  Patient presents with  . Colonoscopy    Never had tcs    HPI:   Alex Marks is a 75 y.o. male who presents to schedule colonoscopy.  The patient was last seen in office 01/18/2020 for positive fit test, history of hepatitis C, hepatic fibrosis, abnormal ultrasound liver.  His last visit was primarily to discuss rescheduling his colonoscopy.  No prior colonoscopy.  History of alcohol and substance abuse but abstinent for about 10 years.  History of hepatitis C status post treatment in 2017 with RNA not detected in September 2017, although did not confirm SVR with labs 12 weeks posttreatment.  In June 2017 ultrasound elastography with normal parenchymal echogenicity but Medicare fibrosis score F2/F3.  No symptoms of decompensated liver disease.  In 2021 his hepatitis C RNA was checked and not detected.  Updated at that time with nodular contour and increased parenchymal echogenicity of the liver, meld score calculated at 6.  ER visit in May 2021 with query suspicious lesion of the kidney followed up with MRI 12/22/2019 with renal subcapsular hematoma decrease in volume by 60% and characteristic primary of late subacute hemorrhage with no obvious renal mass.  At his last visit no overt hepatic or GI complaints.  No drug use in 3 to 4 years, last use was marijuana and cocaine.  No IV drugs since his 39s.  Last alcohol 3 years ago with history of previous alcohol abuse.  Recommended update labs and imaging, consider colonoscopy and possible upper endoscopy.  Previous imaging studies were discussed with radiologist who felt that cirrhosis was "an overcall" and no other imaging suggestive of cirrhosis and therefore EGD not needed.  Recommended proceed with colonoscopy with UDS at preop.  He was scheduled initially for colonoscopy on propofol with Dr. Gala Romney, ASA 3 on  04/17/2020.  However, he called 5 days prior to his procedure to cancel and indicated he would notify us when he is ready to reschedule.  Today states doing okay overall. Has never had a colonoscopy. Previously cancelled two appointments due to bouts with depression. Denies abdominal pain, N/V, hematochezia, melena, fever, chills, unintentional weight loss. Denies URI or flu-like symptoms. Denies loss of sense of taste or smell. The patient has received COVID-19 vaccination(s). Denies chest pain, dyspnea, dizziness, lightheadedness, syncope, near syncope. Denies any other upper or lower GI symptoms.  Past Medical History:  Diagnosis Date  . Anxiety   . COPD (chronic obstructive pulmonary disease) ([place])   . Depression   . Depression   . Frequency of urination   . GAD (generalized anxiety disorder)   . GERD (gastroesophageal reflux disease)   . Hepatitis C    s/p treatment with Dr. Paulita Fujita in 2017. HCV RNA not detected in April 2021  . Hypercholesterolemia   . Hypertension   . PAD (peripheral artery disease) ([place])   . Scalp lesion 04/12/2019  . Substance abuse ([place])   . Transfusion of blood product refused for religious reason   . Type II diabetes mellitus ([place])     Past Surgical History:  Procedure Laterality Date  . ABDOMINAL AORTOGRAM W/LOWER EXTREMITY Bilateral 05/30/2017  . ABDOMINAL AORTOGRAM W/LOWER EXTREMITY N/A 05/30/2017   Procedure: ABDOMINAL AORTOGRAM W/LOWER EXTREMITY;  Surgeon: Elam Dutch, MD;  Location: [facility];  Service: Cardiovascular;  Laterality: N/A;  Bilateral    Current Outpatient  Medications  Medication Sig Dispense Refill  . amLODipine (NORVASC) 10 MG tablet Take 1 tablet (10 mg total) by mouth daily. 90 tablet 0  . aspirin 325 MG EC tablet Take 1 tablet (325 mg total) by mouth daily. May take an additional 325 mgs as needed for sleep (Patient taking differently: Take 325 mg by mouth daily.) 90 tablet 0  . atorvastatin (LIPITOR) 10 MG tablet  TAKE 1 TABLET(10 MG) BY MOUTH DAILY 90 tablet 0  . blood glucose meter kit and supplies KIT Dx   e11.9 1 each 0  . Cholecalciferol (VITAMIN D3) 125 MCG (5000 UT) TABS Take by mouth daily.     [name] escitalopram (LEXAPRO) 5 MG tablet Take 1 tablet (5 mg total) by mouth daily. 30 tablet 3  . fexofenadine (ALLEGRA) 180 MG tablet Take 180 mg by mouth daily.    [name] glipiZIDE (GLUCOTROL) 5 MG tablet Take 1 tablet (5 mg total) by mouth 2 (two) times daily before a meal. 180 tablet 1  . INVOKANA 100 MG TABS tablet TAKE 1 TABLET(100 MG) BY MOUTH DAILY BEFORE BREAKFAST 90 tablet 0  . losartan (COZAAR) 100 MG tablet Take 1 tablet (100 mg total) by mouth daily. 90 tablet 0  . metFORMIN (GLUCOPHAGE) 500 MG tablet Take 1 tablet (500 mg total) by mouth daily with breakfast. 180 tablet 1  . Multiple Vitamin (MULTIVITAMIN WITH MINERALS) TABS tablet Take 1 tablet by mouth daily.    . Omega-3 Fatty Acids (FISH OIL PEARLS) 300 MG CAPS Take 600 mg by mouth 2 (two) times daily. (Patient taking differently: Take 300 mg by mouth daily.) 60 capsule 3  . tadalafil (CIALIS) 20 MG tablet Take 0.5 tablets (10 mg total) by mouth every other day as needed for erectile dysfunction. 5 tablet 2  . vitamin C (ASCORBIC ACID) 500 MG tablet Take 500 mg by mouth daily.     No current facility-administered medications for this visit.    Allergies as of 09/06/2020 - Review Complete 09/06/2020  Allergen Reaction Noted  . Onion  05/27/2017  . Other Swelling 06/07/2019    Family History  Problem Relation Age of Onset  . Alzheimer's disease Mother   . Mental illness Mother        Depression  . Breast cancer Mother   . Cancer Father        bladder?  [name] Hypertension Father   . Alcohol abuse Father   . Heart disease Sister   . Hearing loss Sister        valve  . Alcohol abuse Brother   . Diabetes Paternal Aunt   . COPD Sister   . Alcohol abuse Brother   . Alcohol abuse Brother   . Alcohol abuse Brother   . Early death Brother         MVA  . Cancer Maternal Grandmother   . Colon cancer Neg Hx   . Liver cancer Neg Hx     Social History   Socioeconomic History  . Marital status: Single    Spouse name: Not on file  . Number of children: 0  . Years of education: 9  . Highest education level: Not on file  Occupational History  . Occupation: retired    Comment: labor  Tobacco Use  . Smoking status: Current Every Day Smoker    Packs/day: 1.00    Years: 57.00    Pack years: 57.00    Types: Cigarettes    Start date: 07/01/1961  . Smokeless  tobacco: Never Used  . Tobacco comment: Has tried to quit, but his depressed mood makes it challenging to quit  Vaping Use  . Vaping Use: Never used  Substance and Sexual Activity  . Alcohol use: Not Currently    Comment: history of heavy alcohol use; last drank alcohol about 3 years ago  . Drug use: Not Currently    Types: Marijuana, Cocaine, Heroin, "Crack" cocaine    Comment: Last used heroin in his 78s. Last used cocaine and marijuana about 3 years ago.   [name] Sexual activity: Not Currently  Other Topics Concern  . Not on file  Social History Narrative   Single.Live in boarding house   Renting a room   looking for apartment   Never had license   Likes to read   Social Determinants of Health   Financial Resource Strain: Not on file  Food Insecurity: Not on file  Transportation Needs: Not on file  Physical Activity: Not on file  Stress: Not on file  Social Connections: Not on file    Subjective: Review of Systems  Constitutional: Negative for chills, fever, malaise/fatigue and weight loss.  HENT: Negative for congestion and sore throat.   Respiratory: Negative for cough and shortness of breath.   Cardiovascular: Negative for chest pain and palpitations.  Gastrointestinal: Negative for abdominal pain, blood in stool, constipation, diarrhea, heartburn, melena, nausea and vomiting.  Musculoskeletal: Negative for joint pain and myalgias.  Skin: Negative for  rash.  Neurological: Negative for dizziness and weakness.  Endo/Heme/Allergies: Does not bruise/bleed easily.  Psychiatric/Behavioral: Negative for depression. The patient is not nervous/anxious.   All other systems reviewed and are negative.    Objective: BP (!) 141/76   Pulse 67   Temp (!) 97.1 F (36.2 C) (Temporal)   Ht _0  (1.778 m)   Wt 232 lb 9.6 oz (105.5 kg)   BMI 33.37 kg/m  Physical Exam Vitals and nursing note reviewed.  Constitutional:      General: He is not in acute distress.    Appearance: Normal appearance. He is obese. He is not ill-appearing, toxic-appearing or diaphoretic.  HENT:     Head: Normocephalic and atraumatic.     Nose: No congestion or rhinorrhea.  Eyes:     General: No scleral icterus. Cardiovascular:     Rate and Rhythm: Normal rate and regular rhythm.     Heart sounds: Normal heart sounds.  Pulmonary:     Effort: Pulmonary effort is normal.     Breath sounds: Normal breath sounds.  Abdominal:     General: Bowel sounds are normal. There is no distension.     Palpations: Abdomen is soft. There is no hepatomegaly, splenomegaly or mass.     Tenderness: There is no abdominal tenderness. There is no guarding or rebound.     Hernia: No hernia is present.  Musculoskeletal:     Cervical back: Neck supple.  Skin:    General: Skin is warm and dry.     Coloration: Skin is not jaundiced.     Findings: No bruising or rash.  Neurological:     General: No focal deficit present.     Mental Status: He is alert and oriented to person, place, and time. Mental status is at baseline.  Psychiatric:        Mood and Affect: Mood normal.        Behavior: Behavior normal.        Thought Content: Thought content normal.  Assessment:  Pleasant 75 year old male presents to schedule first ever colonoscopy.  Generally asymptomatic from GI standpoint.  No red flag/warning signs or symptoms.  Previous concerns for possible cirrhosis status post chronic  hepatitis C and SVR after treatment.  However, discussion with radiologist as outlined in HPI indicates cirrhosis likely an overcall.  Need for colonoscopy: Has never had a colonoscopy before.  He is asymptomatic.  At this point we will proceed with colonoscopy as previously recommended.  Noted history of drug abuse and alcohol abuse.  No drugs in 3+ years, no alcohol in 3+ years.  He will require UDS with preop labs.   Proceed with colonoscopy on propofol/MAC by Dr. Gala Romney in near future: the risks, benefits, and alternatives have been discussed with the patient in detail. The patient states understanding and desires to proceed.  The patient is currently on Lexapro, Glucotrol, Invokana, Glucophage, Cialis. The patient is not on any other anticoagulants, anxiolytics, chronic pain medications, antidepressants, antidiabetics, or iron supplements.  We will plan for the procedure on propofol/MAC to promote adequate sedation.   Plan: 1. Colonoscopy as outlined above 2. Further recommendations to follow 3. Follow-up based on post procedure recommendations    Thank you for allowing [place] to participate in the care of Alex Starks, DNP, AGNP-C Adult & Gerontological Nurse Practitioner [facility]   09/06/2020 8:21 AM   Disclaimer: This note was dictated with voice recognition software. Similar sounding words can inadvertently be transcribed and may not be corrected upon review.

## 2020-09-07 ENCOUNTER — Other Ambulatory Visit (INDEPENDENT_AMBULATORY_CARE_PROVIDER_SITE_OTHER): Payer: Self-pay | Admitting: Internal Medicine

## 2020-09-07 MED ORDER — ESCITALOPRAM OXALATE 5 MG PO TABS: 5.0000 mg | ORAL_TABLET | Freq: Every day | ORAL | 1 refills | Status: DC

## 2020-09-08 ENCOUNTER — Ambulatory Visit (HOSPITAL_COMMUNITY): Payer: Medicare HMO

## 2020-09-11 ENCOUNTER — Other Ambulatory Visit (INDEPENDENT_AMBULATORY_CARE_PROVIDER_SITE_OTHER): Payer: Self-pay | Admitting: Internal Medicine

## 2020-09-14 ENCOUNTER — Telehealth: Payer: Self-pay | Admitting: Internal Medicine

## 2020-09-14 ENCOUNTER — Other Ambulatory Visit: Payer: Self-pay

## 2020-09-14 ENCOUNTER — Telehealth: Payer: Self-pay

## 2020-09-14 MED ORDER — PEG 3350-KCL-NA BICARB-NACL 420 G PO SOLR: 4000.0000 mL | ORAL | 0 refills | Status: DC

## 2020-09-14 NOTE — Telephone Encounter (Signed)
See other phone note for today. 

## 2020-09-14 NOTE — Telephone Encounter (Signed)
Pt said he was returning a call. I told him the scheduler was on another line and she would call him back. He said he doesn't answer his phone and he would call back.

## 2020-09-14 NOTE — Telephone Encounter (Signed)
PA for TCS submitted via [employer] website. [employer]# [id number], valid 11/06/20-12/06/20.

## 2020-09-14 NOTE — Telephone Encounter (Signed)
Spoke to pt, TCS scheduled for 11/06/20--AM. Rx for prep sent to pharmacy. Orders entered.

## 2020-09-14 NOTE — Telephone Encounter (Signed)
Tried to call pt to schedule TCS w/Propofol ASA 3 w/Dr. Rourk, LMOVM for return call. 

## 2020-09-15 NOTE — Telephone Encounter (Signed)
Pre-op/covid test 11/02/20. Appt letter mailed with procedure instructions. 

## 2020-09-19 DIAGNOSIS — L603 Nail dystrophy: Secondary | ICD-10-CM | POA: Diagnosis not present

## 2020-09-19 DIAGNOSIS — E1151 Type 2 diabetes mellitus with diabetic peripheral angiopathy without gangrene: Secondary | ICD-10-CM | POA: Diagnosis not present

## 2020-09-19 DIAGNOSIS — I739 Peripheral vascular disease, unspecified: Secondary | ICD-10-CM | POA: Diagnosis not present

## 2020-09-19 DIAGNOSIS — L84 Corns and callosities: Secondary | ICD-10-CM | POA: Diagnosis not present

## 2020-10-09 ENCOUNTER — Other Ambulatory Visit (INDEPENDENT_AMBULATORY_CARE_PROVIDER_SITE_OTHER): Payer: Self-pay | Admitting: Internal Medicine

## 2020-10-09 DIAGNOSIS — E119 Type 2 diabetes mellitus without complications: Secondary | ICD-10-CM

## 2020-10-11 ENCOUNTER — Ambulatory Visit (INDEPENDENT_AMBULATORY_CARE_PROVIDER_SITE_OTHER): Payer: Medicare HMO | Admitting: Nurse Practitioner

## 2020-10-12 ENCOUNTER — Encounter (INDEPENDENT_AMBULATORY_CARE_PROVIDER_SITE_OTHER): Payer: Self-pay | Admitting: Nurse Practitioner

## 2020-10-12 ENCOUNTER — Ambulatory Visit (INDEPENDENT_AMBULATORY_CARE_PROVIDER_SITE_OTHER): Payer: Medicare HMO | Admitting: Nurse Practitioner

## 2020-10-12 ENCOUNTER — Other Ambulatory Visit: Payer: Self-pay

## 2020-10-12 VITALS — BP 124/76 | HR 67 | Temp 97.2°F | Ht 70.0 in | Wt 231.4 lb

## 2020-10-12 DIAGNOSIS — F320 Major depressive disorder, single episode, mild: Secondary | ICD-10-CM | POA: Diagnosis not present

## 2020-10-12 MED ORDER — ESCITALOPRAM OXALATE 10 MG PO TABS: 10.0000 mg | ORAL_TABLET | Freq: Every day | ORAL | 0 refills | Status: DC

## 2020-10-12 NOTE — Progress Notes (Signed)
Subjective:  Patient ID: Alex Marks, male    DOB: 1945/08/23  Age: 76 y.o. MRN: [id number]  CC:  Chief Complaint  Patient presents with  . Follow-up    Discuss need to go up on depression pill, did help a little  . Depression      HPI  This patient arrives today for the above.  Depression: He started on Lexapro 5 mg daily at last office visit for treatment of his depressed mood.  Today he tells me that he has been feeling some benefit from the medicine but not very much.  His main concern is he has lack of motivation to complete chores around the house and get out and do anything.  He denies thoughts of suicide.  Past Medical History:  Diagnosis Date  . Anxiety   . COPD (chronic obstructive pulmonary disease) ([place])   . Depression   . Depression   . Frequency of urination   . GAD (generalized anxiety disorder)   . GERD (gastroesophageal reflux disease)   . Hepatitis C    s/p treatment with Dr. Paulita Fujita in 2017. HCV RNA not detected in April 2021  . Hypercholesterolemia   . Hypertension   . PAD (peripheral artery disease) ([place])   . Scalp lesion 04/12/2019  . Substance abuse ([place])   . Transfusion of blood product refused for religious reason   . Type II diabetes mellitus (HCC)       Family History  Problem Relation Age of Onset  . Alzheimer's disease Mother   . Mental illness Mother        Depression  . Breast cancer Mother   . Cancer Father        bladder?  [name] Hypertension Father   . Alcohol abuse Father   . Heart disease Sister   . Hearing loss Sister        valve  . Alcohol abuse Brother   . Diabetes Paternal Aunt   . COPD Sister   . Alcohol abuse Brother   . Alcohol abuse Brother   . Alcohol abuse Brother   . Early death Brother        MVA  . Cancer Maternal Grandmother   . Colon cancer Neg Hx   . Liver cancer Neg Hx     Social History   Social History Narrative   Single.Live in boarding house   Renting a room   looking for apartment    Never had license   Likes to read   Social History   Tobacco Use  . Smoking status: Current Every Day Smoker    Packs/day: 1.00    Years: 57.00    Pack years: 57.00    Types: Cigarettes    Start date: 07/01/1961  . Smokeless tobacco: Never Used  . Tobacco comment: Has tried to quit, but his depressed mood makes it challenging to quit  Substance Use Topics  . Alcohol use: Not Currently    Comment: history of heavy alcohol use; last drank alcohol about 3 years ago     Current Meds  Medication Sig  . amLODipine (NORVASC) 10 MG tablet Take 1 tablet (10 mg total) by mouth daily.  [name] aspirin 325 MG EC tablet Take 1 tablet (325 mg total) by mouth daily. May take an additional 325 mgs as needed for sleep (Patient taking differently: Take 325 mg by mouth daily.)  . atorvastatin (LIPITOR) 10 MG tablet TAKE 1 TABLET EVERY DAY  . blood glucose  meter kit and supplies KIT Dx   e11.9  . Cholecalciferol (VITAMIN D3) 125 MCG (5000 UT) TABS Take by mouth daily.   . fexofenadine (ALLEGRA) 180 MG tablet Take 180 mg by mouth daily.  [name] glipiZIDE (GLUCOTROL) 5 MG tablet TAKE 1 TABLET TWICE DAILY BEFORE MEALS  . INVOKANA 100 MG TABS tablet TAKE 1 TABLET (100 MG TOTAL) BY MOUTH DAILY BEFORE BREAKFAST.  [name] losartan (COZAAR) 100 MG tablet TAKE 1 TABLET (100 MG TOTAL) BY MOUTH DAILY.  . metFORMIN (GLUCOPHAGE) 500 MG tablet Take 1 tablet (500 mg total) by mouth daily with breakfast.  . Multiple Vitamin (MULTIVITAMIN WITH MINERALS) TABS tablet Take 1 tablet by mouth daily.  . Omega-3 Fatty Acids (FISH OIL PEARLS) 300 MG CAPS Take 600 mg by mouth 2 (two) times daily. (Patient taking differently: Take 300 mg by mouth daily.)  . polyethylene glycol-electrolytes (TRILYTE) 420 g solution Take 4,000 mLs by mouth as directed.  . tadalafil (CIALIS) 20 MG tablet Take 0.5 tablets (10 mg total) by mouth every other day as needed for erectile dysfunction.  . vitamin C (ASCORBIC ACID) 500 MG tablet Take 500 mg by mouth daily.   . [DISCONTINUED] escitalopram (LEXAPRO) 5 MG tablet Take 1 tablet (5 mg total) by mouth daily.    ROS:  Review of Systems  Eyes: Negative for blurred vision.  Respiratory: Negative for shortness of breath.   Cardiovascular: Negative for chest pain.  Neurological: Negative for dizziness and headaches.  Psychiatric/Behavioral: Negative for suicidal ideas.     Objective:   Today's Vitals: BP 124/76   Pulse 67   Temp (!) 97.2 F (36.2 C) (Temporal)   Ht 5' 10" (1.778 m)   Wt 231 lb 6.4 oz (105 kg)   SpO2 96%   BMI 33.20 kg/m  Vitals with BMI 10/12/2020 09/06/2020 08/09/2020  Height 5' 10" 5' 10" 5' 10"  Weight 231 lbs 6 oz 232 lbs 10 oz 234 lbs  BMI 33.2 49.70 26.37  Systolic 858 850 (No Data)  Diastolic 76 76 (No Data)  Pulse 67 67 -     Physical Exam Vitals reviewed.  Constitutional:      Appearance: Normal appearance.  HENT:     Head: Normocephalic and atraumatic.  Cardiovascular:     Rate and Rhythm: Normal rate and regular rhythm.  Pulmonary:     Effort: Pulmonary effort is normal.     Breath sounds: Normal breath sounds.  Musculoskeletal:     Cervical back: Neck supple.  Skin:    General: Skin is warm and dry.  Neurological:     Mental Status: He is alert and oriented to person, place, and time.  Psychiatric:        Mood and Affect: Mood normal.        Behavior: Behavior normal.        Thought Content: Thought content normal.        Judgment: Judgment normal.        PHQ9 SCORE ONLY 10/12/2020 08/09/2020 11/18/2019  PHQ-9 Total Score _0 Assessment and Plan   1. Current mild episode of major depressive disorder, unspecified whether recurrent ([place])      Plan: 1.  We will increase Lexapro to 10 mg by mouth daily.  He will follow-up in 6 to 8 weeks for further evaluation.  May also consider addressing his other chronic conditions at that time as he would be due for blood work.  He was cautioned on  risk for suicidal thoughts with any dosage  change in antidepressants, and that if this would occur he is to notify us right away.  He tells me he understands.   Tests ordered No orders of the defined types were placed in this encounter.     Meds ordered this encounter  Medications  . escitalopram (LEXAPRO) 10 MG tablet    Sig: Take 1 tablet (10 mg total) by mouth daily.    Dispense:  90 tablet    Refill:  0    Order Specific Question:   Supervising Provider    Answer:   Doree Albee [[id number]]    Patient to follow-up in 6 to 8 weeks or sooner as needed.  Ailene Ards, NP

## 2020-10-12 NOTE — Patient Instructions (Signed)
[facility] Dietary Recommendations for Weight Loss What to Avoid . Avoid added sugars o Often added sugar can be found in processed foods such as many condiments, dry cereals, cakes, cookies, chips, crisps, crackers, candies, sweetened drinks, etc.  o Read labels and AVOID/DECREASE use of foods with the following in their ingredient list: Sugar, fructose, high fructose corn syrup, sucrose, glucose, maltose, dextrose, molasses, cane sugar, brown sugar, any type of syrup, agave nectar, etc.   . Avoid snacking in between meals . Avoid foods made with flour o If you are going to eat food made with flour, choose those made with whole-grains; and, minimize your consumption as much as is tolerable . Avoid processed foods o These foods are generally stocked in the middle of the grocery store. Focus on shopping on the perimeter of the grocery.  . Avoid Meat  o We recommend following a plant-based diet at [facility]. Thus, we recommend avoiding meat as a general rule. Consider eating beans, legumes, eggs, and/or dairy products for regular protein sources o If you plan on eating meat limit to 4 ounces of meat at a time and choose lean options such as Fish, chicken, turkey. Avoid red meat intake such as pork and/or steak What to Include . Vegetables o GREEN LEAFY VEGETABLES: Kale, spinach, mustard greens, collard greens, cabbage, broccoli, etc. o OTHER: Asparagus, cauliflower, eggplant, carrots, peas, Brussel sprouts, tomatoes, bell peppers, zucchini, beets, cucumbers, etc. . Grains, seeds, and legumes o Beans: kidney beans, black eyed peas, garbanzo beans, black beans, pinto beans, etc. o Whole, unrefined grains: brown rice, barley, bulgur, oatmeal, etc. . Healthy fats  o Avoid highly processed fats such as vegetable oil o Examples of healthy fats: avocado, olives, virgin olive oil, dark chocolate (?72% Cocoa), nuts (peanuts, almonds, walnuts, cashews, pecans, etc.) . None to Low  Intake of Animal Sources of Protein o Meat sources: chicken, turkey, salmon, tuna. Limit to 4 ounces of meat at one time. o Consider limiting dairy sources, but when choosing dairy focus on: PLAIN Greek yogurt, cottage cheese, high-protein milk . Fruit o Choose berries  When to Eat . Intermittent Fasting: o Choosing not to eat for a specific time period, but DO FOCUS ON HYDRATION when fasting o Multiple Techniques: - Time Restricted Eating: eat 3 meals in a day, each meal lasting no more than 60 minutes, no snacks between meals - 16-18 hour fast: fast for 16 to 18 hours up to 7 days a week. Often suggested to start with 2-3 nonconsecutive days per week.  . Remember the time you sleep is counted as fasting.  . Examples of eating schedule: Fast from 7:00pm-11:00am. Eat between 11:00am-7:00pm.  - 24-hour fast: fast for 24 hours up to every other day. Often suggested to start with 1 day per week . Remember the time you sleep is counted as fasting . Examples of eating schedule:  o Eating day: eat 2-3 meals on your eating day. If doing 2 meals, each meal should last no more than 90 minutes. If doing 3 meals, each meal should last no more than 60 minutes. Finish last meal by 7:00pm. o Fasting day: Fast until 7:00pm.  o IF YOU FEEL UNWELL FOR ANY REASON/IN ANY WAY WHEN FASTING, STOP FASTING BY EATING A NUTRITIOUS SNACK OR LIGHT MEAL o ALWAYS FOCUS ON HYDRATION DURING FASTS - Acceptable Hydration sources: water, broths, tea/coffee (black tea/coffee is best but using a small amount of whole-fat dairy products in coffee/tea is acceptable).  -   Poor Hydration Sources: anything with sugar or artificial sweeteners added to it  These recommendations have been developed for patients that are actively receiving medical care from either Dr. [facility] or Siboney Requejo, DNP, NP-C at [facility]. These recommendations are developed for patients with specific medical conditions and are not meant to be  distributed or used by others that are not actively receiving care from either provider listed above at [facility]. It is not appropriate to participate in the above eating plans without proper medical supervision.   Reference: Fung, J. The obesity code. [place]: [place]; 2016.   

## 2020-10-19 ENCOUNTER — Ambulatory Visit (INDEPENDENT_AMBULATORY_CARE_PROVIDER_SITE_OTHER): Payer: Medicare HMO | Admitting: Internal Medicine

## 2020-10-27 DIAGNOSIS — Z23 Encounter for immunization: Secondary | ICD-10-CM | POA: Diagnosis not present

## 2020-10-31 ENCOUNTER — Telehealth: Payer: Self-pay | Admitting: Internal Medicine

## 2020-10-31 NOTE — Telephone Encounter (Signed)
Tried to call pt, LMOVM to inform him procedure will be cancelled. Advised him to call office if he decides to have procedure. Endo scheduler informed. FYI to Wynne Dust NP.

## 2020-10-31 NOTE — Telephone Encounter (Signed)
Pt is wanting to cancel his procedure with Dr Jena Gauss on 5/9 due to his depression.

## 2020-11-01 NOTE — Telephone Encounter (Signed)
Noted  

## 2020-11-02 ENCOUNTER — Other Ambulatory Visit (HOSPITAL_COMMUNITY): Payer: Medicare HMO

## 2020-11-02 ENCOUNTER — Encounter (HOSPITAL_COMMUNITY): Payer: Medicare HMO

## 2020-11-06 ENCOUNTER — Encounter (HOSPITAL_COMMUNITY): Payer: Self-pay

## 2020-11-06 ENCOUNTER — Ambulatory Visit (HOSPITAL_COMMUNITY): Admit: 2020-11-06 | Payer: Medicare HMO | Admitting: Internal Medicine

## 2020-11-06 SURGERY — COLONOSCOPY WITH PROPOFOL
Anesthesia: Monitor Anesthesia Care

## 2020-11-13 ENCOUNTER — Other Ambulatory Visit (INDEPENDENT_AMBULATORY_CARE_PROVIDER_SITE_OTHER): Payer: Self-pay | Admitting: Internal Medicine

## 2020-11-15 ENCOUNTER — Telehealth (INDEPENDENT_AMBULATORY_CARE_PROVIDER_SITE_OTHER): Payer: Self-pay

## 2020-11-15 NOTE — Telephone Encounter (Signed)
Wants to know if he can take Vitamin D3 5000iu? He is taking 1000iu/day.

## 2020-11-16 NOTE — Telephone Encounter (Signed)
If he is taking 1,000IUs a day but wants to increase his dose, I would recommend that he increase it to 2,000IUs a day.

## 2020-11-22 NOTE — Telephone Encounter (Signed)
Pt contacted and send a avs to home to help keep up with medline list & appts.

## 2020-11-23 ENCOUNTER — Ambulatory Visit (INDEPENDENT_AMBULATORY_CARE_PROVIDER_SITE_OTHER): Payer: Medicare HMO | Admitting: Nurse Practitioner

## 2020-11-30 ENCOUNTER — Other Ambulatory Visit: Payer: Self-pay

## 2020-11-30 ENCOUNTER — Ambulatory Visit (INDEPENDENT_AMBULATORY_CARE_PROVIDER_SITE_OTHER): Payer: Medicare HMO | Admitting: Nurse Practitioner

## 2020-11-30 ENCOUNTER — Encounter (INDEPENDENT_AMBULATORY_CARE_PROVIDER_SITE_OTHER): Payer: Self-pay | Admitting: Nurse Practitioner

## 2020-11-30 VITALS — BP 138/80 | HR 89 | Temp 98.4°F | Resp 18 | Ht 70.0 in | Wt 235.0 lb

## 2020-11-30 DIAGNOSIS — F331 Major depressive disorder, recurrent, moderate: Secondary | ICD-10-CM | POA: Diagnosis not present

## 2020-11-30 DIAGNOSIS — I10 Essential (primary) hypertension: Secondary | ICD-10-CM | POA: Diagnosis not present

## 2020-11-30 DIAGNOSIS — E559 Vitamin D deficiency, unspecified: Secondary | ICD-10-CM

## 2020-11-30 DIAGNOSIS — E1165 Type 2 diabetes mellitus with hyperglycemia: Secondary | ICD-10-CM | POA: Diagnosis not present

## 2020-11-30 MED ORDER — ESCITALOPRAM OXALATE 20 MG PO TABS: 20.0000 mg | ORAL_TABLET | Freq: Every day | ORAL | 1 refills | Status: DC

## 2020-11-30 NOTE — Progress Notes (Signed)
Subjective:  Patient ID: Alex Marks, male    DOB: [birth year]  Age: 75 y.o. MRN: [id number]  CC:  Chief Complaint  Patient presents with  . Depression  . Diabetes  . Other    Vitamin D deficiency  . Hypertension      HPI  This patient arrives today for the above.  Depression: He is on Lexapro 10 mg daily and tells me he is tolerating medication well but he still feels quite depressed.  He denies any suicidal ideation.  He does not feel much motivation to complete tasks and do things.  Diabetes: He continues on metoprolol, glipizide, and Invokana.  He is also on statin and ARB.  Last A1c was 7.0.  Vitamin D deficiency: He continues on 1000 IUs of vitamin D3 daily.  Last serum check showed a vitamin D level of 48.  Hypertension: He continues on losartan and amlodipine.  He is tolerating his medications well.  Past Medical History:  Diagnosis Date  . Anxiety   . COPD (chronic obstructive pulmonary disease) ([place])   . Depression   . Depression   . Frequency of urination   . GAD (generalized anxiety disorder)   . GERD (gastroesophageal reflux disease)   . Hepatitis C    s/p treatment with Dr. Paulita Fujita in 2017. HCV RNA not detected in April 2021  . Hypercholesterolemia   . Hypertension   . PAD (peripheral artery disease) ([place])   . Scalp lesion 04/12/2019  . Substance abuse ([place])   . Transfusion of blood product refused for religious reason   . Type II diabetes mellitus (HCC)       Family History  Problem Relation Age of Onset  . Alzheimer's disease Mother   . Mental illness Mother        Depression  . Breast cancer Mother   . Cancer Father        bladder?  [name] Hypertension Father   . Alcohol abuse Father   . Heart disease Sister   . Hearing loss Sister        valve  . Alcohol abuse Brother   . Diabetes Paternal Aunt   . COPD Sister   . Alcohol abuse Brother   . Alcohol abuse Brother   . Alcohol abuse Brother   . Early death Brother        MVA  .  Cancer Maternal Grandmother   . Colon cancer Neg Hx   . Liver cancer Neg Hx     Social History   Social History Narrative   Single.Live in boarding house   Renting a room   looking for apartment   Never had license   Likes to read   Social History   Tobacco Use  . Smoking status: Current Every Day Smoker    Packs/day: 1.00    Years: 57.00    Pack years: 57.00    Types: Cigarettes    Start date: 07/01/1961  . Smokeless tobacco: Never Used  . Tobacco comment: Has tried to quit, but his depressed mood makes it challenging to quit  Substance Use Topics  . Alcohol use: Not Currently    Comment: history of heavy alcohol use; last drank alcohol about 3 years ago     Current Meds  Medication Sig  . escitalopram (LEXAPRO) 20 MG tablet Take 1 tablet (20 mg total) by mouth daily.    ROS:  Review of Systems  Eyes: Negative for blurred vision.  Respiratory: Negative  for shortness of breath.   Cardiovascular: Negative for chest pain.  Neurological: Negative for dizziness and headaches.     Objective:   Today's Vitals: BP 138/80 (BP Location: Left Arm, Patient Position: Sitting, Cuff Size: Normal)   Pulse 89   Temp 98.4 F (36.9 C) (Temporal)   Resp 18   Ht '5\' 10"'  (1.778 m)   Wt 235 lb (106.6 kg)   SpO2 98%   BMI 33.72 kg/m  Vitals with BMI 11/30/2020 10/12/2020 09/06/2020  Height '5\' 10"'  '5\' 10"'  '5\' 10"'   Weight 235 lbs 231 lbs 6 oz 232 lbs 10 oz  BMI 33.72 74.1 28.78  Systolic 676 720 947  Diastolic 80 76 76  Pulse 89 67 67     Physical Exam Vitals reviewed.  Constitutional:      Appearance: Normal appearance.  HENT:     Head: Normocephalic and atraumatic.  Cardiovascular:     Rate and Rhythm: Normal rate and regular rhythm.  Pulmonary:     Effort: Pulmonary effort is normal.     Breath sounds: Normal breath sounds.  Musculoskeletal:     Cervical back: Neck supple.  Skin:    General: Skin is warm and dry.  Neurological:     Mental Status: He is alert and  oriented to person, place, and time.  Psychiatric:        Mood and Affect: Mood normal.        Behavior: Behavior normal.        Thought Content: Thought content normal.        Judgment: Judgment normal.          Assessment and Plan   1. Moderate episode of recurrent major depressive disorder ([place])   2. Essential hypertension   3. Vitamin D deficiency   4. Controlled type 2 diabetes mellitus with hyperglycemia, without long-term current use of insulin ([place])      Plan: 1.  We will increase his Lexapro to 20 mg daily and he will follow-up in 6 to 8 weeks to see how he is tolerating this change. 2.  He will continue on his antihypertensives as currently prescribed. 3.  We will check vitamin D serum level today for further evaluation. 4.  We will check A1c for further evaluation today.   Tests ordered Orders Placed This Encounter  Procedures  . Hemoglobin A1c  . Vitamin D, 25-hydroxy  . CMP with eGFR([facility])      Meds ordered this encounter  Medications  . escitalopram (LEXAPRO) 20 MG tablet    Sig: Take 1 tablet (20 mg total) by mouth daily.    Dispense:  90 tablet    Refill:  1    Order Specific Question:   Supervising Provider    Answer:   Doree Albee [[id number]]    Patient to follow-up in 6 to 8 weeks or sooner as needed.  Ailene Ards, NP

## 2020-12-01 LAB — COMPLETE METABOLIC PANEL WITH GFR
AG Ratio: 1.4 (calc) (ref 1.0–2.5)
ALT: 24 U/L (ref 9–46)
AST: 21 U/L (ref 10–35)
Albumin: 4.2 g/dL (ref 3.6–5.1)
Alkaline phosphatase (APISO): 113 U/L (ref 35–144)
BUN/Creatinine Ratio: 15 (calc) (ref 6–22)
BUN: 18 mg/dL (ref 7–25)
CO2: 24 mmol/L (ref 20–32)
Calcium: 10 mg/dL (ref 8.6–10.3)
Chloride: 103 mmol/L (ref 98–110)
Creat: 1.21 mg/dL — ABNORMAL HIGH (ref 0.70–1.18)
GFR, Est African American: 68 mL/min/{1.73_m2} (ref >=60)
GFR, Est Non African American: 59 mL/min/{1.73_m2} — ABNORMAL LOW (ref >=60)
Globulin: 3.1 g/dL (calc) (ref 1.9–3.7)
Glucose, Bld: 92 mg/dL (ref 65–139)
Potassium: 4.9 mmol/L (ref 3.5–5.3)
Sodium: 136 mmol/L (ref 135–146)
Total Bilirubin: 0.6 mg/dL (ref 0.2–1.2)
Total Protein: 7.3 g/dL (ref 6.1–8.1)

## 2020-12-01 LAB — VITAMIN D 25 HYDROXY (VIT D DEFICIENCY, FRACTURES): Vit D, 25-Hydroxy: 47 ng/mL (ref 30–100)

## 2020-12-01 LAB — HEMOGLOBIN A1C
Hgb A1c MFr Bld: 7 % of total Hgb — ABNORMAL HIGH (ref <5.7)
Mean Plasma Glucose: 154 mg/dL
eAG (mmol/L): 8.5 mmol/L

## 2020-12-04 ENCOUNTER — Other Ambulatory Visit (INDEPENDENT_AMBULATORY_CARE_PROVIDER_SITE_OTHER): Payer: Self-pay | Admitting: Nurse Practitioner

## 2020-12-04 DIAGNOSIS — E559 Vitamin D deficiency, unspecified: Secondary | ICD-10-CM

## 2020-12-04 MED ORDER — VITAMIN D3 25 MCG (1000 UT) PO CAPS: 1000.0000 [IU] | ORAL_CAPSULE | Freq: Two times a day (BID) | ORAL | 0 refills | Status: AC

## 2020-12-25 ENCOUNTER — Other Ambulatory Visit (INDEPENDENT_AMBULATORY_CARE_PROVIDER_SITE_OTHER): Payer: Self-pay | Admitting: Nurse Practitioner

## 2021-01-08 ENCOUNTER — Other Ambulatory Visit (INDEPENDENT_AMBULATORY_CARE_PROVIDER_SITE_OTHER): Payer: Self-pay | Admitting: Internal Medicine

## 2021-01-16 ENCOUNTER — Telehealth (INDEPENDENT_AMBULATORY_CARE_PROVIDER_SITE_OTHER): Payer: Self-pay | Admitting: Nurse Practitioner

## 2021-01-16 NOTE — Telephone Encounter (Signed)
Patient was given the instructions and has an appointment on Thursday. Patient verbalized an understanding and stated that he has been taking 20 mg and was told to take 1 tablet 10 mg daily for 1 week and then take half a tablet 5 mg daily for 1-2 weeks then can stop per Maralyn Sago.

## 2021-01-16 NOTE — Telephone Encounter (Signed)
Pt came in office today wanting to find out if he can stop taking his depression meds. Pt states that this meds has him sleeping all day, and seems to be making him worse. Please advise pt is here in the waiting area.

## 2021-01-18 ENCOUNTER — Other Ambulatory Visit: Payer: Self-pay

## 2021-01-18 ENCOUNTER — Ambulatory Visit (INDEPENDENT_AMBULATORY_CARE_PROVIDER_SITE_OTHER): Payer: Medicare HMO | Admitting: Internal Medicine

## 2021-01-18 ENCOUNTER — Encounter (INDEPENDENT_AMBULATORY_CARE_PROVIDER_SITE_OTHER): Payer: Self-pay | Admitting: Internal Medicine

## 2021-01-18 VITALS — BP 140/80 | HR 72 | Ht 70.0 in | Wt 229.0 lb

## 2021-01-18 DIAGNOSIS — F331 Major depressive disorder, recurrent, moderate: Secondary | ICD-10-CM | POA: Diagnosis not present

## 2021-01-18 NOTE — Progress Notes (Signed)
Metrics: Intervention Frequency ACO  Documented Smoking Status Yearly  Screened one or more times in 24 months  Cessation Counseling or  Active cessation medication Past 24 months  Past 24 months   Guideline developer: UpToDate (See UpToDate for funding source) Date Released: 2014       Wellness Office Visit  Subjective:  Patient ID: Alex Marks, male    DOB: 07-14-1945  Age: 75 y.o. MRN: [id number]  CC: This patient comes in for follow-up of depression. HPI Alex Marks had increased his Lexapro from 10 mg to 20 mg daily.  Unfortunately, he did not tolerate this so he went back to taking Lexapro 10 mg daily.  He continues to feel lethargic, has no energy or motivation.  He is not suicidal.  This is all a reflection of his depression which has been present for years apparently. Past Medical History:  Diagnosis Date   Anxiety    COPD (chronic obstructive pulmonary disease) (HCC)    Depression    Depression    Frequency of urination    GAD (generalized anxiety disorder)    GERD (gastroesophageal reflux disease)    Hepatitis C    s/p treatment with Dr. Paulita Marks in 2017. HCV RNA not detected in April 2021   Hypercholesterolemia    Hypertension    PAD (peripheral artery disease) ([place])    Scalp lesion 04/12/2019   Substance abuse ([place])    Transfusion of blood product refused for religious reason    Type II diabetes mellitus [facility])    Past Surgical History:  Procedure Laterality Date   ABDOMINAL AORTOGRAM W/LOWER EXTREMITY Bilateral 05/30/2017   ABDOMINAL AORTOGRAM W/LOWER EXTREMITY N/A 05/30/2017   Procedure: ABDOMINAL AORTOGRAM W/LOWER EXTREMITY;  Surgeon: Elam Dutch, MD;  Location: [facility];  Service: Cardiovascular;  Laterality: N/A;  Bilateral     Family History  Problem Relation Age of Onset   Alzheimer's disease Mother    Mental illness Mother        Depression   Breast cancer Mother    Cancer Father        bladder?   Hypertension Father    Alcohol  abuse Father    Heart disease Sister    Hearing loss Sister        valve   Alcohol abuse Brother    Diabetes Paternal Aunt    COPD Sister    Alcohol abuse Brother    Alcohol abuse Brother    Alcohol abuse Brother    Early death Brother        MVA   Cancer Maternal Grandmother    Colon cancer Neg Hx    Liver cancer Neg Hx     Social History   Social History Narrative   Single.Live in boarding house   Renting a room   looking for apartment   Never had license   Likes to read   Social History   Tobacco Use   Smoking status: Every Day    Packs/day: 1.00    Years: 57.00    Pack years: 57.00    Types: Cigarettes    Start date: 07/01/1961   Smokeless tobacco: Never   Tobacco comments:    Has tried to quit, but his depressed mood makes it challenging to quit  Substance Use Topics   Alcohol use: Not Currently    Comment: history of heavy alcohol use; last drank alcohol about 3 years ago    Current Meds  Medication Sig   amLODipine (  NORVASC) 10 MG tablet TAKE 1 TABLET EVERY DAY   aspirin 325 MG EC tablet Take 1 tablet (325 mg total) by mouth daily. May take an additional 325 mgs as needed for sleep (Patient taking differently: Take 325 mg by mouth at bedtime.)   atorvastatin (LIPITOR) 10 MG tablet TAKE 1 TABLET EVERY DAY   blood glucose meter kit and supplies KIT Dx   e11.9   Carboxymethylcell-Glycerin PF (REFRESH OPTIVE PF) 0.5-0.9 % SOLN Place 2 drops into both eyes daily as needed (Irritation).   Cholecalciferol (VITAMIN D3) 25 MCG (1000 UT) CAPS Take 1 capsule (1,000 Units total) by mouth in the morning and at bedtime.   escitalopram (LEXAPRO) 10 MG tablet TAKE 1 TABLET EVERY DAY   fexofenadine (ALLEGRA) 180 MG tablet Take 180 mg by mouth daily.   glipiZIDE (GLUCOTROL) 5 MG tablet TAKE 1 TABLET TWICE DAILY BEFORE MEALS (Patient taking differently: Take 5 mg by mouth 2 (two) times daily before a meal.)   INVOKANA 100 MG TABS tablet TAKE 1 TABLET (100 MG TOTAL) BY MOUTH  DAILY BEFORE BREAKFAST. (Patient taking differently: Take 100 mg by mouth daily before breakfast.)   losartan (COZAAR) 100 MG tablet TAKE 1 TABLET (100 MG TOTAL) BY MOUTH DAILY.   metFORMIN (GLUCOPHAGE) 500 MG tablet Take 1 tablet (500 mg total) by mouth daily with breakfast.   Multiple Vitamin (MULTIVITAMIN WITH MINERALS) TABS tablet Take 1 tablet by mouth daily. [facility]   Omega-3 Fatty Acids (FISH OIL PEARLS) 300 MG CAPS Take 600 mg by mouth 2 (two) times daily. (Patient taking differently: Take 1,000 mg by mouth at bedtime.)   oxymetazoline (NASAL SPRAY MOISTURIZING 12 HR) 0.05 % nasal spray Place 1 spray into both nostrils daily as needed for congestion.   polyethylene glycol-electrolytes (TRILYTE) 420 g solution Take 4,000 mLs by mouth as directed.   vitamin C (ASCORBIC ACID) 500 MG tablet Take 500 mg by mouth at bedtime.     [place] Office Visit from 11/30/2020 in [facility]  PHQ-9 Total Score 13       Objective:   Today's Vitals: BP 140/80   Pulse 72   Ht '5\' 10"'  (1.778 m)   Wt 229 lb (103.9 kg)   BMI 32.86 kg/m  Vitals with BMI 01/18/2021 11/30/2020 10/12/2020  Height '5\' 10"'  '5\' 10"'  '5\' 10"'   Weight 229 lbs 235 lbs 231 lbs 6 oz  BMI 32.86 40.81 44.8  Systolic 185 631 497  Diastolic 80 80 76  Pulse 72 89 67     Physical Exam   Almost tearful.  Vital signs are stable.    Assessment   1. Moderate episode of recurrent major depressive disorder ([name])       Tests ordered Orders Placed This Encounter  Procedures   Ambulatory referral to Psychiatry      Plan: 1.  I will refer him to psychiatry now as I feel that he needs more expert evaluation and management.  He is not suicidal currently. 2.  Follow-up with me in 3 months.   No orders of the defined types were placed in this encounter.   Doree Albee, MD

## 2021-02-01 ENCOUNTER — Encounter (INDEPENDENT_AMBULATORY_CARE_PROVIDER_SITE_OTHER): Payer: Self-pay

## 2021-03-09 ENCOUNTER — Telehealth: Payer: Self-pay | Admitting: Internal Medicine

## 2021-03-09 NOTE — Telephone Encounter (Signed)
Pt said he has a knot/bump in his neck and it won't go away. He said he was afraid it was cancer.  Wouldn't he need to follow up with Dr Allena Katz or see a ENT doctor for something like this? [phone]

## 2021-03-09 NOTE — Telephone Encounter (Signed)
Agree with recommendations.  

## 2021-03-09 NOTE — Telephone Encounter (Signed)
Spoke with pt, he states that he has a non painful bump in between his neck and shoulder on the right side. I instructed the pt to contact his pcp due to [place] not handling that part of the body. Pt states that his pcp has passed away and was unsure of where to go from here. I informed pt that he needed to find a new pcp or go to urgent care. Pt verbalized understanding.

## 2021-04-05 ENCOUNTER — Telehealth: Payer: Self-pay | Admitting: Internal Medicine

## 2021-04-05 NOTE — Telephone Encounter (Signed)
Patient called in needing prescription refill.  Did not specify and did not answer on return call. Using [facility] on [address]

## 2021-04-10 ENCOUNTER — Other Ambulatory Visit: Payer: Self-pay

## 2021-04-10 ENCOUNTER — Encounter: Payer: Self-pay | Admitting: Emergency Medicine

## 2021-04-10 ENCOUNTER — Ambulatory Visit
Admission: EM | Admit: 2021-04-10 | Discharge: 2021-04-10 | Disposition: A | Discharge status: Home / Self Care | Payer: Medicare HMO | Attending: Internal Medicine | Admitting: Internal Medicine

## 2021-04-10 ENCOUNTER — Ambulatory Visit: Payer: Self-pay | Admitting: *Deleted

## 2021-04-10 DIAGNOSIS — Z760 Encounter for issue of repeat prescription: Secondary | ICD-10-CM | POA: Diagnosis not present

## 2021-04-10 DIAGNOSIS — E119 Type 2 diabetes mellitus without complications: Secondary | ICD-10-CM | POA: Diagnosis not present

## 2021-04-10 MED ORDER — GLIPIZIDE 5 MG PO TABS: 5.0000 mg | ORAL_TABLET | Freq: Two times a day (BID) | ORAL | 1 refills | Status: DC

## 2021-04-10 MED ORDER — AMLODIPINE BESYLATE 10 MG PO TABS: 10.0000 mg | ORAL_TABLET | Freq: Every day | ORAL | 0 refills | Status: DC

## 2021-04-10 MED ORDER — CANAGLIFLOZIN 100 MG PO TABS: 100.0000 mg | ORAL_TABLET | Freq: Every day | ORAL | 0 refills | Status: DC

## 2021-04-10 MED ORDER — LOSARTAN POTASSIUM 100 MG PO TABS: 100.0000 mg | ORAL_TABLET | Freq: Every day | ORAL | 0 refills | Status: DC

## 2021-04-10 NOTE — Discharge Instructions (Addendum)
Please take medications as prescribed Follow-up with primary care physician as scheduled.

## 2021-04-10 NOTE — ED Provider Notes (Signed)
[facility]    CSN: [id number] Arrival date & time: 04/10/21  1615      History   Chief Complaint No chief complaint on file.   HPI Alex Marks is a 75 y.o. male comes to the urgent care for medication refill.  Patient has no complaints.  He does not check his blood sugars regularly.  Blood pressures are presently well controlled.  No chest pain or chest pressure.  No shortness of breath.  No dysuria urgency frequency.  No polyuria polyphagia or weight loss.Alex Marks   HPI  Past Medical History:  Diagnosis Date   Anxiety    COPD (chronic obstructive pulmonary disease) (HCC)    Depression    Depression    Frequency of urination    GAD (generalized anxiety disorder)    GERD (gastroesophageal reflux disease)    Hepatitis C    s/p treatment with Dr. Paulita Fujita in 2017. HCV RNA not detected in April 2021   Hypercholesterolemia    Hypertension    PAD (peripheral artery disease) ([place])    Scalp lesion 04/12/2019   Substance abuse ([place])    Transfusion of blood product refused for religious reason    Type II diabetes mellitus [facility])     Patient Active Problem List   Diagnosis Date Noted   Preventative health care 09/06/2020   Abnormal ultrasound of liver 01/26/2020   NSAID long-term use 10/07/2019   Positive FIT (fecal immunochemical test) 10/07/2019   Hepatic fibrosis 10/07/2019   Depression 09/02/2019   Type 2 diabetes mellitus ([place]) 07/08/2019   Screen for colon cancer 07/08/2019   Encounter for screening for lung cancer 07/08/2019   Dark stools 07/08/2019   Advanced care planning/counseling discussion 07/08/2019   Scalp lesion 04/12/2019   PAD (peripheral artery disease) ([place]) 05/30/2017   PVD (peripheral vascular disease) ([place]) 05/30/2017   History of hepatitis C 11/11/2016   History of smoking 30 or more pack years 11/11/2016   Alcohol abuse 10/08/2016   Substance abuse ([place]) 10/08/2016   Tobacco abuse 10/08/2016   GAD (generalized anxiety disorder)  10/08/2016   HLD (hyperlipidemia) 10/08/2016   Essential hypertension 10/08/2016   ED (erectile dysfunction) 10/08/2016    Past Surgical History:  Procedure Laterality Date   ABDOMINAL AORTOGRAM W/LOWER EXTREMITY Bilateral 05/30/2017   ABDOMINAL AORTOGRAM W/LOWER EXTREMITY N/A 05/30/2017   Procedure: ABDOMINAL AORTOGRAM W/LOWER EXTREMITY;  Surgeon: Elam Dutch, MD;  Location: [place] [facility];  Service: Cardiovascular;  Laterality: N/A;  Bilateral       Home Medications    Prior to Admission medications   Medication Sig Start Date End Date Taking? Authorizing Provider  amLODipine (NORVASC) 10 MG tablet Take 1 tablet (10 mg total) by mouth daily. 04/10/21   Chase Picket, MD  aspirin 325 MG EC tablet Take 1 tablet (325 mg total) by mouth daily. May take an additional 325 mgs as needed for sleep Patient taking differently: Take 325 mg by mouth at bedtime. 07/15/19   Doree Albee, MD  atorvastatin (LIPITOR) 10 MG tablet TAKE 1 TABLET EVERY DAY 01/09/21   Hurshel Party C, MD  blood glucose meter kit and supplies KIT Dx   e11.9 06/19/17   Raylene Everts, MD  canagliflozin [facility]) 100 MG TABS tablet Take 1 tablet (100 mg total) by mouth daily before breakfast. 04/10/21   Brinley Rosete, Myrene Galas, MD  Carboxymethylcell-Glycerin PF (REFRESH OPTIVE PF) 0.5-0.9 % SOLN Place 2 drops into both eyes daily as needed (Irritation).  [provider]  Cholecalciferol (VITAMIN D3) 25 MCG (1000 UT) CAPS Take 1 capsule (1,000 Units total) by mouth in the morning and at bedtime. 12/04/20   Ailene Ards, NP  escitalopram (LEXAPRO) 10 MG tablet TAKE 1 TABLET EVERY DAY 12/25/20   Hurshel Party C, MD  fexofenadine (ALLEGRA) 180 MG tablet Take 180 mg by mouth daily.    [provider]  glipiZIDE (GLUCOTROL) 5 MG tablet Take 1 tablet (5 mg total) by mouth 2 (two) times daily before a meal. TAKE 1 TABLET TWICE DAILY BEFORE MEALS 04/10/21   Jaklyn Alen, Myrene Galas, MD  losartan  (COZAAR) 100 MG tablet Take 1 tablet (100 mg total) by mouth daily. 04/10/21   Chase Picket, MD  metFORMIN (GLUCOPHAGE) 500 MG tablet Take 1 tablet (500 mg total) by mouth daily with breakfast. 07/12/20   Doree Albee, MD  Multiple Vitamin (MULTIVITAMIN WITH MINERALS) TABS tablet Take 1 tablet by mouth daily. Research officer, political party, Historical, MD  Omega-3 Fatty Acids (FISH OIL PEARLS) 300 MG CAPS Take 600 mg by mouth 2 (two) times daily. Patient taking differently: Take 1,000 mg by mouth at bedtime. 07/15/19   Doree Albee, MD  oxymetazoline (NASAL SPRAY MOISTURIZING 12 HR) 0.05 % nasal spray Place 1 spray into both nostrils daily as needed for congestion.    [provider]  polyethylene glycol-electrolytes (TRILYTE) 420 g solution Take 4,000 mLs by mouth as directed. 09/14/20   Rourk, Cristopher Estimable, MD  vitamin C (ASCORBIC ACID) 500 MG tablet Take 500 mg by mouth at bedtime.    [provider]    Family History Family History  Problem Relation Age of Onset   Alzheimer's disease Mother    Mental illness Mother        Depression   Breast cancer Mother    Cancer Father        bladder?   Hypertension Father    Alcohol abuse Father    Heart disease Sister    Hearing loss Sister        valve   Alcohol abuse Brother    Diabetes Paternal Aunt    COPD Sister    Alcohol abuse Brother    Alcohol abuse Brother    Alcohol abuse Brother    Early death Brother        MVA   Cancer Maternal Grandmother    Colon cancer Neg Hx    Liver cancer Neg Hx     Social History Social History   Tobacco Use   Smoking status: Every Day    Packs/day: 1.00    Years: 57.00    Pack years: 57.00    Types: Cigarettes    Start date: 07/01/1961   Smokeless tobacco: Never   Tobacco comments:    Has tried to quit, but his depressed mood makes it challenging to quit  Vaping Use   Vaping Use: Never used  Substance Use Topics   Alcohol use: Not Currently    Comment: history of  heavy alcohol use; last drank alcohol about 3 years ago   Drug use: Not Currently    Types: Marijuana, Cocaine, Heroin, "Crack" cocaine    Comment: Last used heroin in his 17s. Last used cocaine and marijuana about 3 years ago.      Allergies   Onion and Other   Review of Systems Review of Systems  All other systems reviewed and are negative.   Physical Exam Triage Vital Signs  ED Triage Vitals  Enc Vitals Group     BP 04/10/21 1800 (!) 151/82     Pulse Rate 04/10/21 1800 81     Resp 04/10/21 1800 18     Temp 04/10/21 1800 98.7 F (37.1 C)     Temp Source 04/10/21 1800 Oral     SpO2 04/10/21 1800 96 %     Weight --      Height --      Head Circumference --      Peak Flow --      Pain Score 04/10/21 1802 0     Pain Loc --      Pain Edu? --      Excl. in [place]? --    No data found.  Updated Vital Signs BP (!) 151/82 (BP Location: Right Arm)   Pulse 81   Temp 98.7 F (37.1 C) (Oral)   Resp 18   SpO2 96%   Visual Acuity Right Eye Distance:   Left Eye Distance:   Bilateral Distance:    Right Eye Near:   Left Eye Near:    Bilateral Near:     Physical Exam Vitals and nursing note reviewed.  Constitutional:      General: He is not in acute distress.    Appearance: Normal appearance. He is not ill-appearing.  Cardiovascular:     Rate and Rhythm: Normal rate and regular rhythm.     Pulses: Normal pulses.     Heart sounds: Normal heart sounds.  Pulmonary:     Effort: Pulmonary effort is normal.     Breath sounds: Normal breath sounds.  Abdominal:     General: Bowel sounds are normal.     Palpations: Abdomen is soft.  Neurological:     Mental Status: He is alert.     UC Treatments / Results  Labs (all labs ordered are listed, but only abnormal results are displayed) Labs Reviewed - No data to display  EKG   Radiology No results found.  Procedures Procedures (including critical care time)  Medications Ordered in UC Medications - No data to  display  Initial Impression / Assessment and Plan / UC Course  I have reviewed the triage vital signs and the nursing notes.  Pertinent labs & imaging results that were available during my care of the patient were reviewed by me and considered in my medical decision making (see chart for details).     1.  Encounter for medication refill: Medications have been refilled Patient has an appointment with her primary care physician in the next few weeks.  He is encouraged to keep that appointment. Final Clinical Impressions(s) / UC Diagnoses   Final diagnoses:  Encounter for medication refill  Controlled type 2 diabetes mellitus without complication, without long-term current use of insulin [facility])     Discharge Instructions      Please take medications as prescribed Follow-up with primary care physician as scheduled.   ED Prescriptions     Medication Sig Dispense Auth. Provider   amLODipine (NORVASC) 10 MG tablet Take 1 tablet (10 mg total) by mouth daily. 90 tablet Reginald Mangels, Myrene Galas, MD   canagliflozin (INVOKANA) 100 MG TABS tablet Take 1 tablet (100 mg total) by mouth daily before breakfast. 90 tablet Everest Brod, Myrene Galas, MD   losartan (COZAAR) 100 MG tablet Take 1 tablet (100 mg total) by mouth daily. 90 tablet Burns Timson, Myrene Galas, MD   glipiZIDE (GLUCOTROL) 5 MG tablet Take 1 tablet (5 mg total)  by mouth 2 (two) times daily before a meal. TAKE 1 TABLET TWICE DAILY BEFORE MEALS 180 tablet Colisha Redler, Myrene Galas, MD      PDMP not reviewed this encounter.   Chase Picket, MD 04/10/21 [phone]

## 2021-04-10 NOTE — ED Triage Notes (Signed)
Needs a refill on medications.  Has appointment with PCP later this month.  Invokana 100mg  daily Glipizide 5mg  BID Losartan 100mg  daily Amlodipine 10mg  daily

## 2021-04-10 NOTE — Telephone Encounter (Signed)
Pt called saying he is out of several of his medications.  He use to see Dr. Christoper Allegra in [place].  He does a have a new pt appt Monday but some of these meds are heart related and he has already been out for a couple of days   CB#  [phone]   Call to patient- patient has contacted the new provider's office and they are not going to fill anything until he is established. Advised patient that is normally the protocol followed - advised UC for bridge medications- he states he will go.   Reason for Disposition . [1] Prescription refill request for ESSENTIAL medicine (i.e., likelihood of harm to patient if not taken) AND [2] triager unable to refill per department policy  Protocols used: Medication Refill and Renewal Call-A-AH

## 2021-04-16 ENCOUNTER — Ambulatory Visit (INDEPENDENT_AMBULATORY_CARE_PROVIDER_SITE_OTHER): Payer: Medicare HMO | Admitting: Internal Medicine

## 2021-04-16 ENCOUNTER — Encounter: Payer: Self-pay | Admitting: Internal Medicine

## 2021-04-16 ENCOUNTER — Other Ambulatory Visit: Payer: Self-pay

## 2021-04-16 VITALS — BP 144/82 | HR 64 | Temp 98.2°F | Ht 70.0 in | Wt 237.0 lb

## 2021-04-16 DIAGNOSIS — E782 Mixed hyperlipidemia: Secondary | ICD-10-CM | POA: Diagnosis not present

## 2021-04-16 DIAGNOSIS — Z1211 Encounter for screening for malignant neoplasm of colon: Secondary | ICD-10-CM | POA: Diagnosis not present

## 2021-04-16 DIAGNOSIS — I10 Essential (primary) hypertension: Secondary | ICD-10-CM

## 2021-04-16 DIAGNOSIS — E1169 Type 2 diabetes mellitus with other specified complication: Secondary | ICD-10-CM

## 2021-04-16 DIAGNOSIS — F320 Major depressive disorder, single episode, mild: Secondary | ICD-10-CM

## 2021-04-16 DIAGNOSIS — I739 Peripheral vascular disease, unspecified: Secondary | ICD-10-CM | POA: Diagnosis not present

## 2021-04-16 DIAGNOSIS — Z7689 Persons encountering health services in other specified circumstances: Secondary | ICD-10-CM | POA: Diagnosis not present

## 2021-04-16 DIAGNOSIS — B351 Tinea unguium: Secondary | ICD-10-CM | POA: Insufficient documentation

## 2021-04-16 DIAGNOSIS — Z23 Encounter for immunization: Secondary | ICD-10-CM

## 2021-04-16 MED ORDER — EMPAGLIFLOZIN 10 MG PO TABS: 10.0000 mg | ORAL_TABLET | Freq: Every day | ORAL | 2 refills | Status: DC

## 2021-04-16 MED ORDER — AMLODIPINE BESYLATE 10 MG PO TABS
10.0000 mg | ORAL_TABLET | Freq: Every day | ORAL | 1 refills | Status: DC
Start: 2021-04-16 — End: 2021-10-08

## 2021-04-16 MED ORDER — ATORVASTATIN CALCIUM 10 MG PO TABS: 10.0000 mg | ORAL_TABLET | Freq: Every day | ORAL | 1 refills | Status: DC

## 2021-04-16 MED ORDER — METFORMIN HCL 500 MG PO TABS
500.0000 mg | ORAL_TABLET | Freq: Every day | ORAL | 1 refills | Status: DC
Start: 2021-04-16 — End: 2022-04-26

## 2021-04-16 MED ORDER — LOSARTAN POTASSIUM 100 MG PO TABS: 100.0000 mg | ORAL_TABLET | Freq: Every day | ORAL | 0 refills | Status: DC

## 2021-04-16 MED ORDER — GLIPIZIDE 5 MG PO TABS: 5.0000 mg | ORAL_TABLET | Freq: Two times a day (BID) | ORAL | 0 refills | Status: DC

## 2021-04-16 NOTE — Patient Instructions (Signed)
Please start taking Jardiance as prescribed instead of Invokana.  Please follow low carb and low salt diet and ambulate as tolerated.  Please continue taking other medications as prescribed.  Please get fasting blood tests before the next visit.

## 2021-04-20 ENCOUNTER — Encounter: Payer: Self-pay | Admitting: Internal Medicine

## 2021-04-20 DIAGNOSIS — Z7689 Persons encountering health services in other specified circumstances: Secondary | ICD-10-CM | POA: Insufficient documentation

## 2021-04-20 NOTE — Assessment & Plan Note (Signed)
Now resolved DC Lexapro

## 2021-04-20 NOTE — Assessment & Plan Note (Signed)
Lab Results  Component Value Date   HGBA1C 7.0 (H) 11/30/2020   On glipizide, metformin and Invokana Switched to [employer] to follow diabetic diet On statin and ARB F/u CMP and lipid panel Diabetic foot exam: Today Diabetic eye exam: Advised to follow up with Ophthalmology for diabetic eye exam

## 2021-04-20 NOTE — Assessment & Plan Note (Signed)
On statin Check lipid profile 

## 2021-04-20 NOTE — Assessment & Plan Note (Signed)
Advised to use Lamisil cream 

## 2021-04-20 NOTE — Assessment & Plan Note (Signed)
On aspirin and statin °

## 2021-04-20 NOTE — Assessment & Plan Note (Signed)
BP Readings from Last 1 Encounters:  04/16/21 (!) 144/82   Elevated today, but can be considered acceptable considering his age Counseled for compliance with the medications Advised DASH diet and moderate exercise/walking, at least 150 mins/week

## 2021-04-20 NOTE — Progress Notes (Signed)
New Patient Office Visit  Subjective:  Patient ID: Alex Marks, male    DOB: Mar 26, 1946  Age: 74 y.o. MRN: [id number]  CC:  Chief Complaint  Patient presents with   New Patient (Initial Visit)    New Pt. Previous PCP Dr. Anastasio Champion last seen 3 months ago. Has been out of his Invokana x 10 days. Discuss depression previous medication he did not do well with (Lexapro)    HPI Alex Marks is a 75 year old male with PMH of HTN, PVD, hep C, type II DM, HLD and depression who presents for establishing care.  HTN: His BP was mildly elevated in the office today. Takes medications regularly. Patient denies headache, dizziness, chest pain, dyspnea or palpitations.  Type II DM: He takes metformin, glipizide and Invokana for it.  His last Hgb A1c was 7.0.  He denies any fatigue, polyuria or polydipsia.  He has run out of [place] recently.  He used to take Lexapro for depression, but states that he has been feeling well now.  He has not been taking Lexapro lately.  Denies any anxiety spells, SI or HI.  Chart review suggests use of BZDs in the past.  He has history of PVD, for which she takes aspirin and Lipitor.  Denies any Claudication symptoms currently.  He has had COVID vaccine. He received flu vaccine in the office today.   Past Medical History:  Diagnosis Date   Anxiety    COPD (chronic obstructive pulmonary disease) ([place])    Dark stools 07/08/2019   Depression    Depression    Frequency of urination    GAD (generalized anxiety disorder)    GERD (gastroesophageal reflux disease)    Hepatitis C    s/p treatment with Dr. Paulita Fujita in 2017. HCV RNA not detected in April 2021   Hypercholesterolemia    Hypertension    PAD (peripheral artery disease) ([place])    Scalp lesion 04/12/2019   Substance abuse ([place])    Transfusion of blood product refused for religious reason    Type II diabetes mellitus [facility])     Past Surgical History:  Procedure Laterality Date   ABDOMINAL AORTOGRAM  W/LOWER EXTREMITY Bilateral 05/30/2017   ABDOMINAL AORTOGRAM W/LOWER EXTREMITY N/A 05/30/2017   Procedure: ABDOMINAL AORTOGRAM W/LOWER EXTREMITY;  Surgeon: Elam Dutch, MD;  Location: [place] [facility];  Service: Cardiovascular;  Laterality: N/A;  Bilateral    Family History  Problem Relation Age of Onset   Alzheimer's disease Mother    Mental illness Mother        Depression   Breast cancer Mother    Cancer Father        bladder?   Hypertension Father    Alcohol abuse Father    Heart disease Sister    Hearing loss Sister        valve   Alcohol abuse Brother    Diabetes Paternal Aunt    COPD Sister    Alcohol abuse Brother    Alcohol abuse Brother    Alcohol abuse Brother    Early death Brother        MVA   Cancer Maternal Grandmother    Colon cancer Neg Hx    Liver cancer Neg Hx     Social History   Socioeconomic History   Marital status: Single    Spouse name: Not on file   Number of children: 0   Years of education: 11   Highest education level: Not on file  Occupational History   Occupation: retired    Comment: labor  Tobacco Use   Smoking status: Former    Packs/day: 1.00    Years: 57.00    Pack years: 57.00    Types: Cigarettes    Start date: 07/01/1961    Quit date: 01/29/2021    Years since quitting: 0.2   Smokeless tobacco: Never   Tobacco comments:    Has tried to quit, but his depressed mood makes it challenging to quit  Vaping Use   Vaping Use: Never used  Substance and Sexual Activity   Alcohol use: Not Currently    Comment: history of heavy alcohol use; last drank alcohol about 3 years ago   Drug use: Not Currently    Types: Marijuana, Cocaine, Heroin, "Crack" cocaine    Comment: Last used heroin in his 48s. Last used cocaine and marijuana about 3 years ago.    Sexual activity: Not Currently  Other Topics Concern   Not on file  Social History Narrative   Single.Live in boarding house   Renting a room   looking for apartment   Never  had license   Likes to read   Social Determinants of Health   Financial Resource Strain: Not on file  Food Insecurity: Not on file  Transportation Needs: Not on file  Physical Activity: Not on file  Stress: Not on file  Social Connections: Not on file  Intimate Partner Violence: Not on file    ROS Review of Systems  Constitutional:  Negative for chills and fever.  HENT:  Negative for congestion and sore throat.   Eyes:  Negative for pain and discharge.  Respiratory:  Negative for cough and shortness of breath.   Cardiovascular:  Negative for chest pain and palpitations.  Gastrointestinal:  Negative for diarrhea, nausea and vomiting.  Endocrine: Negative for polydipsia and polyuria.  Genitourinary:  Negative for dysuria and hematuria.  Musculoskeletal:  Positive for arthralgias. Negative for neck pain and neck stiffness.  Skin:  Negative for rash.  Neurological:  Negative for dizziness, weakness, numbness and headaches.  Psychiatric/Behavioral:  Negative for agitation and behavioral problems.    Objective:   Today's Vitals: BP (!) 144/82 (BP Location: Right Arm, Cuff Size: Normal)   Pulse 64   Temp 98.2 F (36.8 C) (Oral)   Ht '5\' 10"'  (1.778 m)   Wt 237 lb 0.6 oz (107.5 kg)   SpO2 93%   BMI 34.01 kg/m   Physical Exam Vitals reviewed.  Constitutional:      General: He is not in acute distress.    Appearance: He is not diaphoretic.  HENT:     Head: Normocephalic and atraumatic.     Nose: Nose normal.     Mouth/Throat:     Mouth: Mucous membranes are moist.  Eyes:     General: No scleral icterus.    Extraocular Movements: Extraocular movements intact.  Cardiovascular:     Rate and Rhythm: Normal rate and regular rhythm.     Heart sounds: Normal heart sounds. No murmur heard. Pulmonary:     Breath sounds: Normal breath sounds. No wheezing or rales.  Abdominal:     Palpations: Abdomen is soft.     Tenderness: There is no abdominal tenderness.  Musculoskeletal:      Cervical back: Neck supple. No tenderness.     Right lower leg: No edema.     Left lower leg: No edema.  Skin:    General: Skin is warm.  Findings: No rash.  Neurological:     General: No focal deficit present.     Mental Status: He is alert and oriented to person, place, and time. Mental status is at baseline.     Sensory: No sensory deficit.  Psychiatric:        Mood and Affect: Mood normal.        Behavior: Behavior normal.    Assessment & Plan:   Problem List Items Addressed This Visit       Cardiovascular and Mediastinum   Essential hypertension    BP Readings from Last 1 Encounters:  04/16/21 (!) 144/82  Elevated today, but can be considered acceptable considering his age 70 for compliance with the medications Advised DASH diet and moderate exercise/walking, at least 150 mins/week       Relevant Medications   aspirin 81 MG chewable tablet   amLODipine (NORVASC) 10 MG tablet   atorvastatin (LIPITOR) 10 MG tablet   losartan (COZAAR) 100 MG tablet   Other Relevant Orders   CBC with Differential/Platelet   CMP14+EGFR   TSH   PVD (peripheral vascular disease) (HCC)    On aspirin and statin      Relevant Medications   aspirin 81 MG chewable tablet   amLODipine (NORVASC) 10 MG tablet   atorvastatin (LIPITOR) 10 MG tablet   losartan (COZAAR) 100 MG tablet     Endocrine   Type 2 diabetes mellitus (HCC)    Lab Results  Component Value Date   HGBA1C 7.0 (H) 11/30/2020  On glipizide, metformin and Invokana Switched to [employer] to follow diabetic diet On statin and ARB F/u CMP and lipid panel Diabetic foot exam: Today Diabetic eye exam: Advised to follow up with Ophthalmology for diabetic eye exam       Relevant Medications   empagliflozin (JARDIANCE) 10 MG TABS tablet   aspirin 81 MG chewable tablet   atorvastatin (LIPITOR) 10 MG tablet   glipiZIDE (GLUCOTROL) 5 MG tablet   losartan (COZAAR) 100 MG tablet   metFORMIN (GLUCOPHAGE)  500 MG tablet   Other Relevant Orders   CMP14+EGFR   HgB A1c     Musculoskeletal and Integument   Onychomycosis    Advised to use Lamisil cream        Other   HLD (hyperlipidemia)    On statin  Check lipid profile       Relevant Medications   aspirin 81 MG chewable tablet   amLODipine (NORVASC) 10 MG tablet   atorvastatin (LIPITOR) 10 MG tablet   losartan (COZAAR) 100 MG tablet   Other Relevant Orders   Lipid panel   Screen for colon cancer   Relevant Orders   Cologuard   Depression    Now resolved DC Lexapro      Encounter to establish care - Primary    Care established Previous chart reviewed History and medications reviewed with the patient      Other Visit Diagnoses     Need for immunization against influenza       Relevant Orders   Flu Vaccine QUAD High Dose(Fluad) (Completed)       Outpatient Encounter Medications as of 04/16/2021  Medication Sig   aspirin 81 MG chewable tablet Chew 1 tablet by mouth daily.   blood glucose meter kit and supplies KIT Dx   e11.9   Cholecalciferol (VITAMIN D3) 25 MCG (1000 UT) CAPS Take 1 capsule (1,000 Units total) by mouth in the morning and at bedtime.   empagliflozin (  JARDIANCE) 10 MG TABS tablet Take 1 tablet (10 mg total) by mouth daily before breakfast.   fexofenadine (ALLEGRA) 180 MG tablet Take 180 mg by mouth daily.   Multiple Vitamin (MULTIVITAMIN WITH MINERALS) TABS tablet Take 1 tablet by mouth daily. [facility]   Omega-3 Fatty Acids (FISH OIL PEARLS) 300 MG CAPS Take 600 mg by mouth 2 (two) times daily. (Patient taking differently: Take 1,000 mg by mouth at bedtime.)   vitamin C (ASCORBIC ACID) 500 MG tablet Take 500 mg by mouth at bedtime.   [DISCONTINUED] amLODipine (NORVASC) 10 MG tablet Take 1 tablet (10 mg total) by mouth daily.   [DISCONTINUED] aspirin 325 MG EC tablet Take 1 tablet (325 mg total) by mouth daily. May take an additional 325 mgs as needed for sleep (Patient taking differently: Take  325 mg by mouth at bedtime.)   [DISCONTINUED] atorvastatin (LIPITOR) 10 MG tablet TAKE 1 TABLET EVERY DAY   [DISCONTINUED] glipiZIDE (GLUCOTROL) 5 MG tablet Take 1 tablet (5 mg total) by mouth 2 (two) times daily before a meal. TAKE 1 TABLET TWICE DAILY BEFORE MEALS   [DISCONTINUED] losartan (COZAAR) 100 MG tablet Take 1 tablet (100 mg total) by mouth daily.   [DISCONTINUED] metFORMIN (GLUCOPHAGE) 500 MG tablet Take 1 tablet (500 mg total) by mouth daily with breakfast.   amLODipine (NORVASC) 10 MG tablet Take 1 tablet (10 mg total) by mouth daily.   atorvastatin (LIPITOR) 10 MG tablet Take 1 tablet (10 mg total) by mouth daily.   Carboxymethylcell-Glycerin PF (REFRESH OPTIVE PF) 0.5-0.9 % SOLN Place 2 drops into both eyes daily as needed (Irritation). (Patient not taking: Reported on 04/16/2021)   glipiZIDE (GLUCOTROL) 5 MG tablet Take 1 tablet (5 mg total) by mouth 2 (two) times daily before a meal. TAKE 1 TABLET TWICE DAILY BEFORE MEALS   losartan (COZAAR) 100 MG tablet Take 1 tablet (100 mg total) by mouth daily.   metFORMIN (GLUCOPHAGE) 500 MG tablet Take 1 tablet (500 mg total) by mouth daily with breakfast.   oxymetazoline (NASAL SPRAY MOISTURIZING 12 HR) 0.05 % nasal spray Place 1 spray into both nostrils daily as needed for congestion. (Patient not taking: Reported on 04/16/2021)   polyethylene glycol-electrolytes (TRILYTE) 420 g solution Take 4,000 mLs by mouth as directed. (Patient not taking: Reported on 04/16/2021)   [DISCONTINUED] canagliflozin (INVOKANA) 100 MG TABS tablet Take 1 tablet (100 mg total) by mouth daily before breakfast. (Patient not taking: Reported on 04/16/2021)   [DISCONTINUED] escitalopram (LEXAPRO) 10 MG tablet TAKE 1 TABLET EVERY DAY   No facility-administered encounter medications on file as of 04/16/2021.    Follow-up: Return in about 3 months (around 07/17/2021) for HTN and DM.   Lindell Spar, MD

## 2021-04-20 NOTE — Assessment & Plan Note (Signed)
Care established Previous chart reviewed History and medications reviewed with the patient 

## 2021-04-25 ENCOUNTER — Ambulatory Visit (INDEPENDENT_AMBULATORY_CARE_PROVIDER_SITE_OTHER): Payer: Medicare HMO | Admitting: Internal Medicine

## 2021-05-07 DIAGNOSIS — E1151 Type 2 diabetes mellitus with diabetic peripheral angiopathy without gangrene: Secondary | ICD-10-CM | POA: Diagnosis not present

## 2021-05-07 DIAGNOSIS — L84 Corns and callosities: Secondary | ICD-10-CM | POA: Diagnosis not present

## 2021-05-07 DIAGNOSIS — L603 Nail dystrophy: Secondary | ICD-10-CM | POA: Diagnosis not present

## 2021-05-07 DIAGNOSIS — I739 Peripheral vascular disease, unspecified: Secondary | ICD-10-CM | POA: Diagnosis not present

## 2021-05-30 IMAGING — CT CT ABD-PELV W/ CM
2 of 5 series · 15 of 46 positions shown, 17 images · IV contrast (Omnipaque or Isovue)
Comparison: CT 11/16/2019

CLINICAL DATA: Right flank pain abnormal CT KUB

EXAM:
CT ABDOMEN AND PELVIS WITH CONTRAST
TECHNIQUE: Multidetector CT imaging of the abdomen and pelvis was performed
using the standard protocol following bolus administration of
intravenous contrast.
CONTRAST:  100mL OMNIPAQUE IOHEXOL 300 MG/ML  SOLN

[Series 3: axial st · axial · 0.77mm/px · z∈[+988,+1398]mm · 12 of 94 slices shown, 14 images]
[im 6/94  soft-tissue]
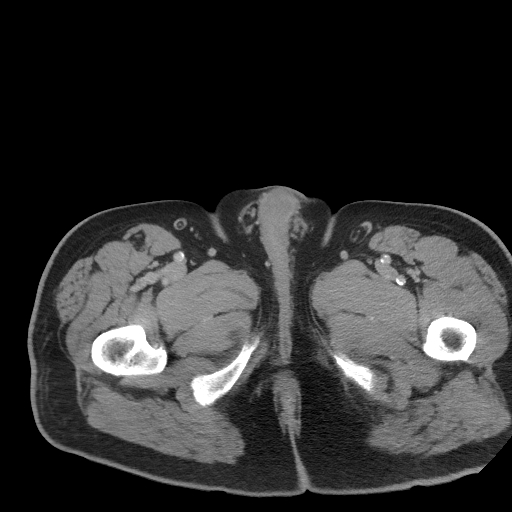
[im 6/94  bone]
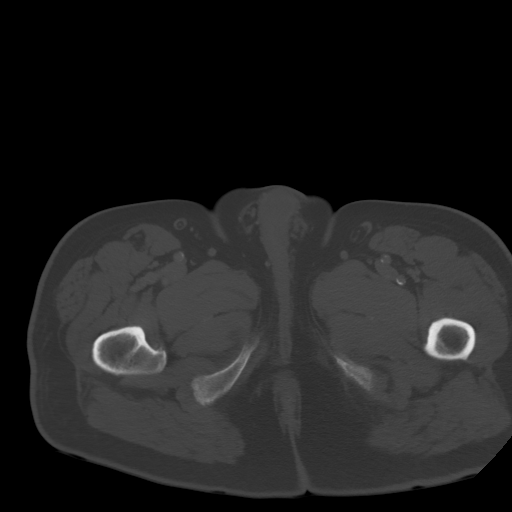
[im 12/94  soft-tissue]
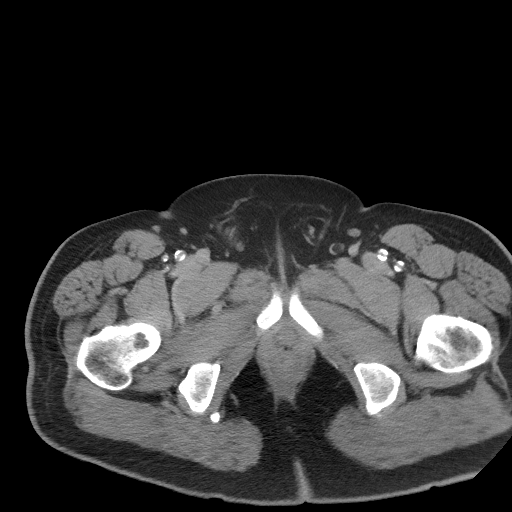
[im 24/94  soft-tissue]
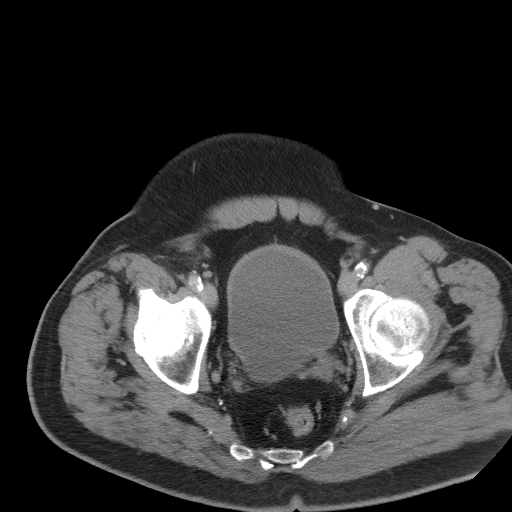
[im 30/94  soft-tissue]
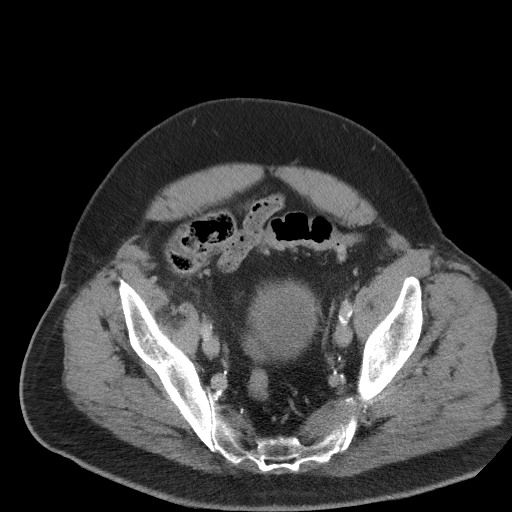
[im 35/94  soft-tissue]
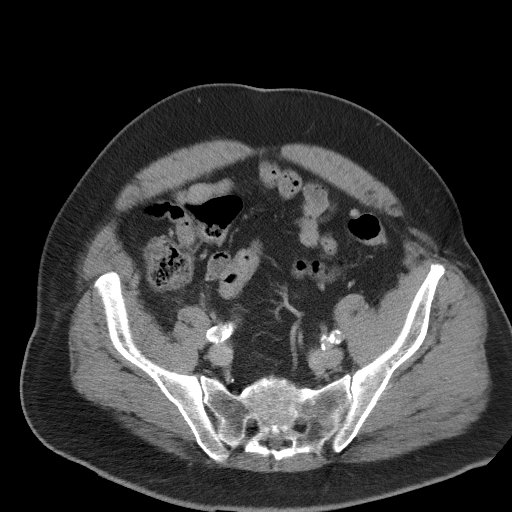
[im 41/94  soft-tissue]
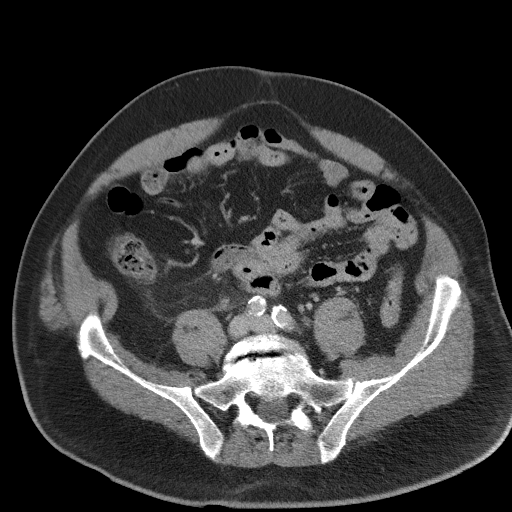
[im 53/94  soft-tissue]
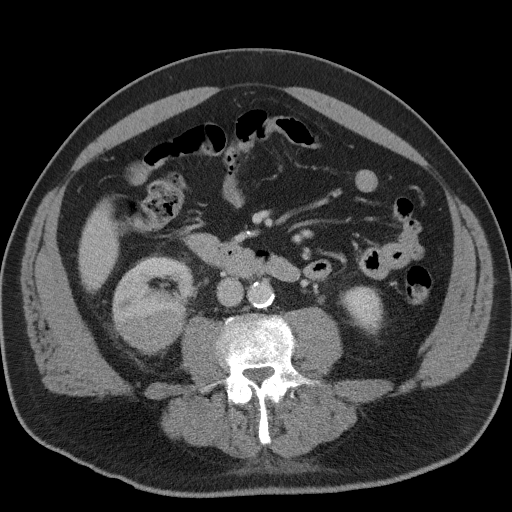
[im 59/94  soft-tissue]
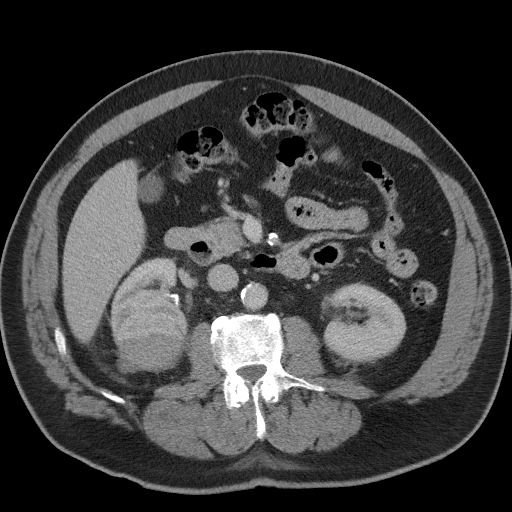
[im 64/94  soft-tissue]
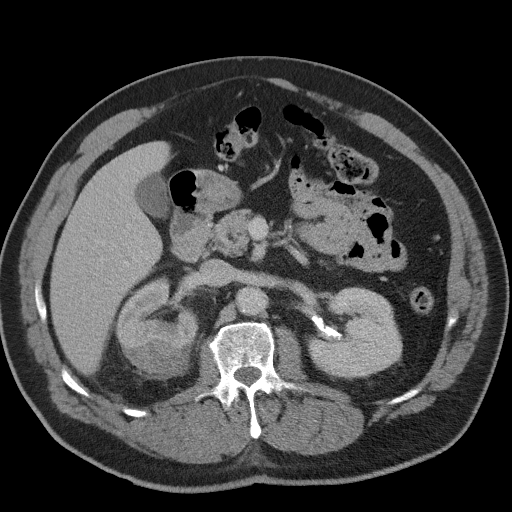
[im 64/94  bone]
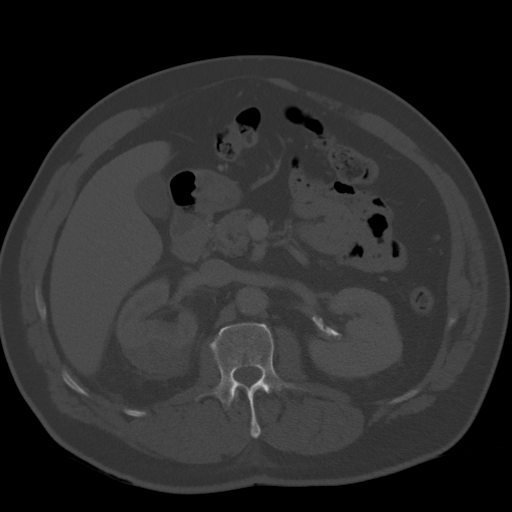
[im 70/94  soft-tissue]
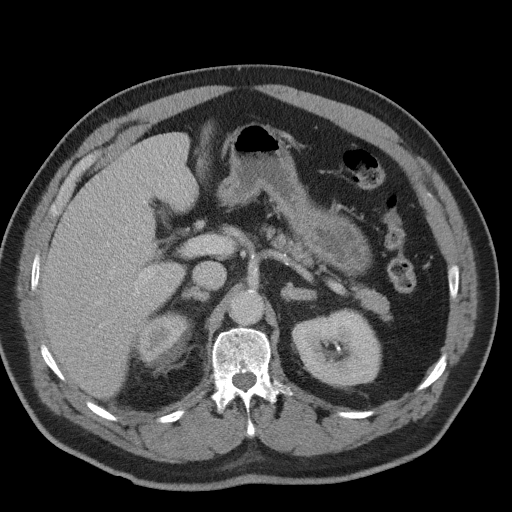
[im 82/94  soft-tissue]
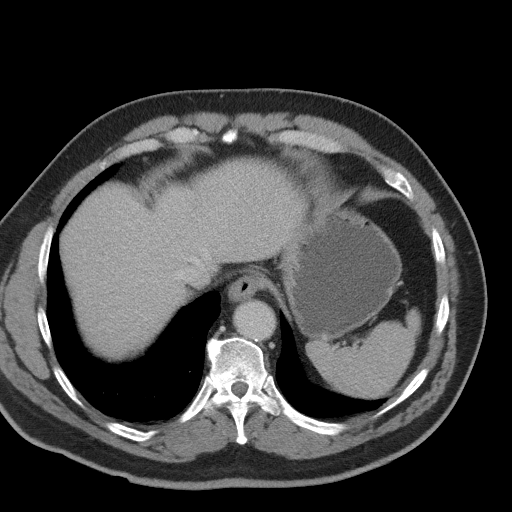
[im 88/94  soft-tissue]
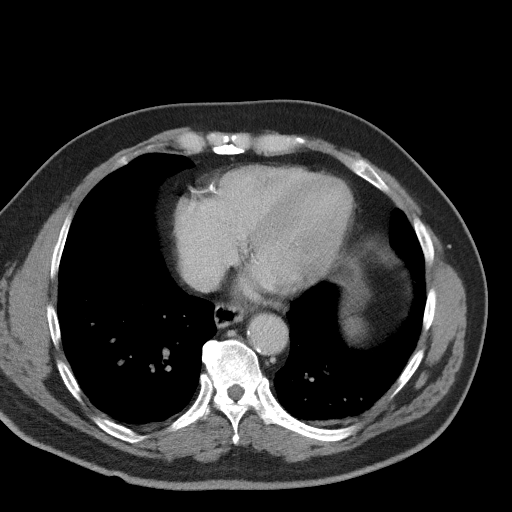

[Series 6: coronal st · coronal · 0.75mm/px · 3 of 113 slices shown]
[im 38/113  soft-tissue]
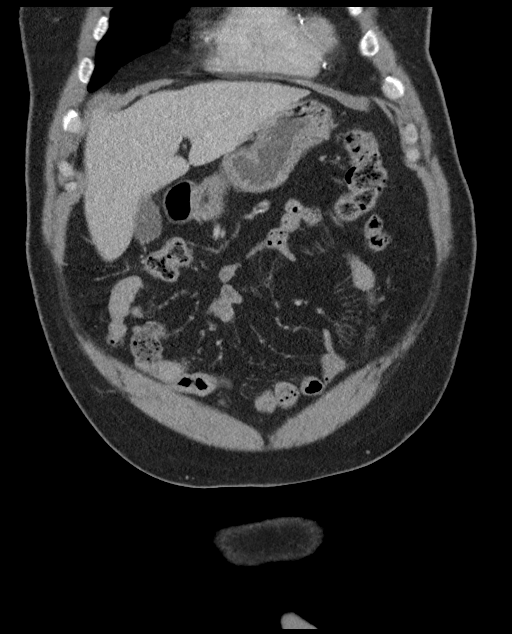
[im 50/113  soft-tissue]
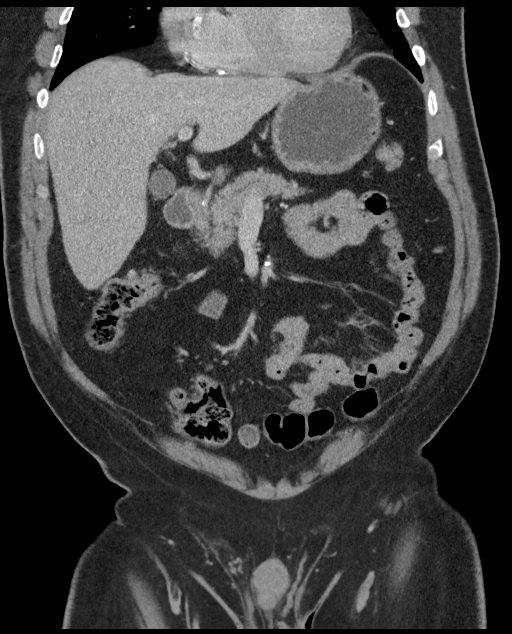
[im 63/113  soft-tissue]
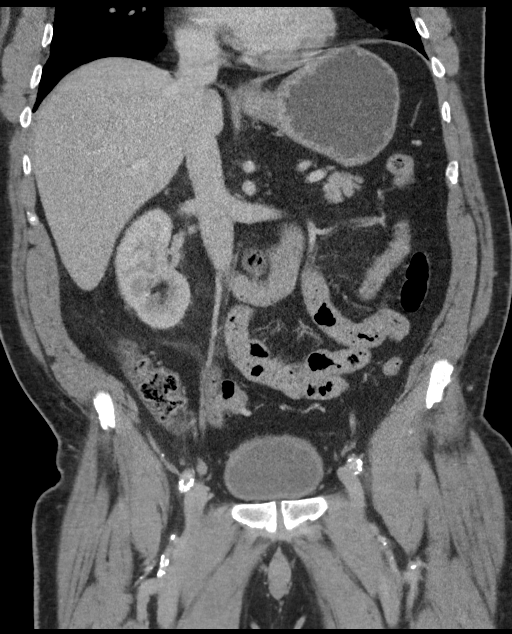

[15 of 46 positions shown; findings below may reference images not displayed]

FINDINGS: Lower chest: No acute abnormality. Cardiomegaly. Coronary vascular
calcification.

Hepatobiliary: No focal liver abnormality is seen. No gallstones,
gallbladder wall thickening, or biliary dilatation.

Pancreas: Unremarkable. No pancreatic ductal dilatation or
surrounding inflammatory changes.

Spleen: Normal in size without focal abnormality.

Adrenals/Urinary Tract: Adrenal glands are normal. Intrarenal
vascular calcification. 3 mm stone in the upper pole of the left
kidney. No hydronephrosis. Hyperdense subcapsular collection
involving right kidney posteriorly from superior pole to inferior
pole. This measures approximately 13.9 cm craniocaudad by 4.9 cm
transverse by 2.8 cm AP. Lobulated contour deformity at the midpole.
No definite active extravasation at this time. Excretion of contrast
into the right renal collecting system on delayed view. The urinary
bladder is unremarkable

Stomach/Bowel: Stomach is within normal limits. Appendix appears
normal. Sigmoid colon diverticular disease without acute
inflammatory change. No evidence of bowel wall thickening,
distention, or inflammatory changes.

Vascular/Lymphatic: Extensive aortic atherosclerosis. No aneurysm.
No suspicious adenopathy.

Reproductive: Enlarged prostate.

Other: Negative for free air or free fluid. Fat containing umbilical
hernia.

Musculoskeletal: Degenerative changes. No acute or suspicious
osseous abnormality.
IMPRESSION: 1. Moderate to large intrinsically dense subcapsular collection
involving the right kidney from superior to inferior pole,
consistent with acute subcapsular perirenal hematoma. No
extravasation visualized at this time to suggest active bleeding.
Mass effect on the underlying renal parenchyma, placing kidney at
risk for Page kidney. There is convex contour deformity at the
posterior aspect of the hematoma at the midpole suggesting possible
mass though the density is somewhat indistinguishable from the
adjacent hematoma. Follow-up MRI would be recommended after
resolution of acute episode.
2. Nonobstructing left kidney stone
3. Sigmoid colon diverticular disease without acute inflammatory
change

## 2021-07-04 ENCOUNTER — Encounter: Payer: Medicare HMO | Admitting: Internal Medicine

## 2021-07-05 ENCOUNTER — Other Ambulatory Visit: Payer: Self-pay

## 2021-07-05 ENCOUNTER — Ambulatory Visit (INDEPENDENT_AMBULATORY_CARE_PROVIDER_SITE_OTHER): Payer: Medicare HMO

## 2021-07-05 DIAGNOSIS — Z0000 Encounter for general adult medical examination without abnormal findings: Secondary | ICD-10-CM

## 2021-07-05 NOTE — Progress Notes (Signed)
Subjective:   Alex Marks is a 76 y.o. male who presents for Medicare Annual/Subsequent preventive examination. I connected with  Alex Marks on 07/05/21 by a audio enabled telemedicine application and verified that I am speaking with the correct person using two identifiers.  Patient Location: Home  Provider Location: Office/Clinic  I discussed the limitations of evaluation and management by telemedicine. The patient expressed understanding and agreed to proceed.  Review of Systems    Defer to PCP Cardiac Risk Factors include: advanced age (>36mn, >>25women);male gender;diabetes mellitus;hypertension;smoking/ tobacco exposure     Objective:    There were no vitals filed for this visit. There is no height or weight on file to calculate BMI.  Advanced Directives 07/05/2021 11/16/2019 06/07/2019 05/30/2017 05/30/2017 05/14/2017 05/11/2017  Does Patient Have a Medical Advance Directive? _0  No No  Would patient like information on creating a medical advance directive? No - Patient declined - No - Patient declined No - Patient declined - No - Patient declined -    Current Medications (verified) Outpatient Encounter Medications as of 07/05/2021  Medication Sig   amLODipine (NORVASC) 10 MG tablet Take 1 tablet (10 mg total) by mouth daily.   aspirin 81 MG chewable tablet Chew 1 tablet by mouth daily.   atorvastatin (LIPITOR) 10 MG tablet Take 1 tablet (10 mg total) by mouth daily.   blood glucose meter kit and supplies KIT Dx   e11.9   Cholecalciferol (VITAMIN D3) 25 MCG (1000 UT) CAPS Take 1 capsule (1,000 Units total) by mouth in the morning and at bedtime.   empagliflozin (JARDIANCE) 10 MG TABS tablet Take 1 tablet (10 mg total) by mouth daily before breakfast.   fexofenadine (ALLEGRA) 180 MG tablet Take 180 mg by mouth daily.   glipiZIDE (GLUCOTROL) 5 MG tablet Take 1 tablet (5 mg total) by mouth 2 (two) times daily before a meal. TAKE 1 TABLET TWICE DAILY BEFORE MEALS    losartan (COZAAR) 100 MG tablet Take 1 tablet (100 mg total) by mouth daily.   metFORMIN (GLUCOPHAGE) 500 MG tablet Take 1 tablet (500 mg total) by mouth daily with breakfast.   Multiple Vitamin (MULTIVITAMIN WITH MINERALS) TABS tablet Take 1 tablet by mouth daily. [facility]   Omega-3 Fatty Acids (FISH OIL PEARLS) 300 MG CAPS Take 600 mg by mouth 2 (two) times daily. (Patient taking differently: Take 1,000 mg by mouth at bedtime.)   oxymetazoline (NASAL SPRAY MOISTURIZING 12 HR) 0.05 % nasal spray Place 1 spray into both nostrils daily as needed for congestion.   vitamin C (ASCORBIC ACID) 500 MG tablet Take 500 mg by mouth at bedtime.   Carboxymethylcell-Glycerin PF (REFRESH OPTIVE PF) 0.5-0.9 % SOLN Place 2 drops into both eyes daily as needed (Irritation). (Patient not taking: Reported on 04/16/2021)   [DISCONTINUED] polyethylene glycol-electrolytes (TRILYTE) 420 g solution Take 4,000 mLs by mouth as directed. (Patient not taking: Reported on 04/16/2021)   No facility-administered encounter medications on file as of 07/05/2021.    Allergies (verified) Onion and Other   History: Past Medical History:  Diagnosis Date   Anxiety    [place]    Dark stools 07/08/2019   Depression    Depression    Frequency of urination    GAD (generalized anxiety disorder)    GERD (gastroesophageal reflux disease)    Hepatitis C    s/p treatment with Dr. OPaulita Fujitain 2017. HCV RNA not detected in April  2021   Hypercholesterolemia    Hypertension    PAD (peripheral artery disease) (H[facility])    Scalp lesion 04/12/2019   Substance abuse ([place])    Transfusion of blood product refused for religious reason    Type II diabetes mellitus [facility])    Past Surgical History:  Procedure Laterality Date   ABDOMINAL AORTOGRAM W/LOWER EXTREMITY Bilateral 05/30/2017   ABDOMINAL AORTOGRAM W/LOWER EXTREMITY N/A 05/30/2017   Procedure: ABDOMINAL AORTOGRAM W/LOWER EXTREMITY;   Surgeon: Elam Dutch, MD;  Location: [facility];  Service: Cardiovascular;  Laterality: N/A;  Bilateral   Family History  Problem Relation Age of Onset   Alzheimer's disease Mother    Mental illness Mother        Depression   Breast cancer Mother    Cancer Father        bladder?   Hypertension Father    Alcohol abuse Father    Heart disease Sister    Hearing loss Sister        valve   Alcohol abuse Brother    Diabetes Paternal Aunt    COPD Sister    Alcohol abuse Brother    Alcohol abuse Brother    Alcohol abuse Brother    Early death Brother        MVA   Cancer Maternal Grandmother    Colon cancer Neg Hx    Liver cancer Neg Hx    Social History   Socioeconomic History   Marital status: Single    Spouse name: Not on file   Number of children: 0   Years of education: 11   Highest education level: Not on file  Occupational History   Occupation: retired    Comment: labor  Tobacco Use   Smoking status: Former    Packs/day: 1.00    Years: 57.00    Pack years: 57.00    Types: Cigarettes    Start date: 07/01/1961    Quit date: 01/29/2021    Years since quitting: 0.4   Smokeless tobacco: Never   Tobacco comments:    Has tried to quit, but his depressed mood makes it challenging to quit  Vaping Use   Vaping Use: Never used  Substance and Sexual Activity   Alcohol use: Not Currently    Comment: history of heavy alcohol use; last drank alcohol about 3 years ago   Drug use: Not Currently    Types: Marijuana, Cocaine, Heroin, "Crack" cocaine    Comment: Last used heroin in his 1s. Last used cocaine and marijuana about 3 years ago.    Sexual activity: Not Currently  Other Topics Concern   Not on file  Social History Narrative   Single.Live in boarding house   Renting a room   looking for apartment   Never had license   Likes to read   Social Determinants of Health   Financial Resource Strain: Low Risk    Difficulty of Paying Living Expenses: Not  hard at all  Food Insecurity: No Food Insecurity   Worried About Charity fundraiser in the Last Year: Never true   Arboriculturist in the Last Year: Never true  Transportation Needs: No Transportation Needs   Lack of Transportation (Medical): No   Lack of Transportation (Non-Medical): No  Physical Activity: Sufficiently Active   Days of Exercise per Week: 5 days   Minutes of Exercise per Session: 30 min  Stress: No Stress Concern Present   Feeling of Stress :  Not at all  Social Connections: Moderately Isolated   Frequency of Communication with Friends and Family: More than three times a week   Frequency of Social Gatherings with Friends and Family: Never   Attends Religious Services: More than 4 times per year   Active Member of [employer] or Organizations: No   Attends Music therapist: Never   Marital Status: Never married    Tobacco Counseling Counseling given: Not Answered Tobacco comments: Has tried to quit, but his depressed mood makes it challenging to quit   Clinical Intake:  Pre-visit preparation completed: No  Pain : No/denies pain     Nutritional Risks: None Diabetes: Yes CBG done?: No Did pt. bring in CBG monitor from home?: No  How often do you need to have someone help you when you read instructions, pamphlets, or other written materials from your doctor or pharmacy?: 1 - Never What is the last grade level you completed in school?: 11th  Diabetic?Nutrition Risk Assessment:  Has the patient had any N/V/D within the last 2 months?  No  Does the patient have any non-healing wounds?  No  Has the patient had any unintentional weight loss or weight gain?  No   Diabetes:  Is the patient diabetic?  Yes  If diabetic, was a CBG obtained today?  No  Did the patient bring in their glucometer from home?   N/a How often do you monitor your CBG's? Once daily.   Financial Strains and Diabetes Management:  Are you having any financial strains with the  device, your supplies or your medication? No .  Does the patient want to be seen by Chronic Care Management for management of their diabetes?  No  Would the patient like to be referred to a Nutritionist or for Diabetic Management?  No   Diabetic Exams:  Diabetic Eye Exam: Overdue for diabetic eye exam. Pt has been advised about the importance in completing this exam. Patient advised to call and schedule an eye exam. Diabetic Foot Exam: Completed 04/16/2021    Interpreter Needed?: No  Information entered by :: [place] of Daily Living In your present state of health, do you have any difficulty performing the following activities: 07/05/2021 11/30/2020  Hearing? N N  Vision? N Y  Difficulty concentrating or making decisions? N Y  Walking or climbing stairs? N N  Dressing or bathing? N N  Doing errands, shopping? N N  Preparing Food and eating ? N -  Using the Toilet? N -  In the past six months, have you accidently leaked urine? N -  Do you have problems with loss of bowel control? N -  Managing your Medications? N -  Managing your Finances? N -  Housekeeping or managing your Housekeeping? N -  Some recent data might be hidden    Patient Care Team: Lindell Spar, MD as PCP - General (Internal Medicine) Gala Romney, Cristopher Estimable, MD as Consulting Physician (Gastroenterology)  Indicate any recent Medical Services you may have received from other than [facility] providers in the past year (date may be approximate).     Assessment:   This is a routine wellness examination for [place].  Hearing/Vision screen No results found.  Dietary issues and exercise activities discussed: Current Exercise Habits: Home exercise routine, Type of exercise: walking, Time (Minutes): 30, Frequency (Times/Week): 5, Weekly Exercise (Minutes/Week): 150, Intensity: Mild, Exercise limited by: None identified   Goals Addressed  This Visit's Progress    Quit smoking / using tobacco   Not on  track     Depression Screen PHQ 2/9 Scores 07/05/2021 04/16/2021 11/30/2020 10/12/2020 08/09/2020 11/18/2019 10/06/2019  PHQ - 2 Score _0 0  PHQ- 9 Score _1 -  Exception Documentation - - - - - Medical reason Medical reason    Fall Risk Fall Risk  07/05/2021 04/16/2021 11/30/2020 10/12/2020 11/18/2019  Falls in the past year? 0 0 0 0 0  Number falls in past yr: 0 0 0 - 0  Injury with Fall? 0 0 0 - 0  Risk for fall due to : - No Fall Risks No Fall Risks - No Fall Risks  Follow up Falls evaluation completed Falls evaluation completed Falls evaluation completed - Falls evaluation completed    FALL RISK PREVENTION PERTAINING TO THE HOME:  Any stairs in or around the home? Yes  If so, are there any without handrails? Yes  Home free of loose throw rugs in walkways, pet beds, electrical cords, etc? Yes  Adequate lighting in your home to reduce risk of falls? Yes   ASSISTIVE DEVICES UTILIZED TO PREVENT FALLS:  Life alert? No  Use of a cane, walker or w/c? Yes  Grab bars in the bathroom? No  Shower chair or bench in shower? Yes  Elevated toilet seat or a handicapped toilet? No      Cognitive Function:     6CIT Screen 07/05/2021  What Year? 0 points  What month? 0 points  What time? 0 points  Count back from 20 0 points  Months in reverse 4 points  Repeat phrase 6 points  Total Score 10    Immunizations Immunization History  Administered Date(s) Administered   DTaP 09/01/2015   Fluad Quad(high Dose 65+) 04/12/2020, 04/16/2021   Hep A / Hep B 02/21/2016, 04/25/2016   Influenza Inj Mdck Quad Pf 04/12/2019   Influenza-Unspecified 02/21/2016, 04/14/2017   PFIZER(Purple Top)SARS-COV-2 Vaccination 08/05/2019, 08/30/2019   Pneumococcal Conjugate-13 05/17/2019   Pneumococcal Polysaccharide-23 08/23/2015   Tdap 05/17/2019    TDAP status: Up to date  Flu Vaccine status: Up to date  Pneumococcal vaccine status: Up to date  Covid-19 vaccine status: Information  provided on how to obtain vaccines.   Qualifies for Shingles Vaccine? Yes   Zostavax completed No   Shingrix Completed?: No.    Education has been provided regarding the importance of this vaccine. Patient has been advised to call insurance company to determine out of pocket expense if they have not yet received this vaccine. Advised may also receive vaccine at local pharmacy or Health Dept. Verbalized acceptance and understanding.  Screening Tests Health Maintenance  Topic Date Due   Zoster Vaccines- Shingrix (1 of 2) Never done   COLONOSCOPY (Pts 45-66yr Insurance coverage will need to be confirmed)  Never done   COVID-19 Vaccine (3 - Pfizer risk series) 09/27/2019   OPHTHALMOLOGY EXAM  07/06/2020   HEMOGLOBIN A1C  06/01/2021   FOOT EXAM  04/16/2022   TETANUS/TDAP  05/16/2029   Pneumonia Vaccine 75 Years old  Completed   INFLUENZA VACCINE  Completed   Hepatitis C Screening  Completed   HPV VACCINES  Aged Out    Health Maintenance  Health Maintenance Due  Topic Date Due   Zoster Vaccines- Shingrix (1 of 2) Never done   COLONOSCOPY (Pts 45-4101yrInsurance coverage will need to be confirmed)  Never done   COVID-19  Vaccine (3 - Pfizer risk series) 09/27/2019   OPHTHALMOLOGY EXAM  07/06/2020   HEMOGLOBIN A1C  06/01/2021    Colorectal cancer screening: Type of screening: Colonoscopy. Completed Patient Refuses to have this done. Repeat every n/a years  Lung Cancer Screening: (Low Dose CT Chest recommended if Age 110-80 years, 30 pack-year currently smoking OR have quit w/in 15years.) does qualify.   Lung Cancer Screening Referral: 07/05/2021  Additional Screening:  Hepatitis C Screening: does qualify; Completed 01/26/2020  Vision Screening: Recommended annual ophthalmology exams for early detection of glaucoma and other disorders of the eye. Is the patient up to date with their annual eye exam?  No  Who is the provider or what is the name of the office in which the patient  attends annual eye exams? [facility]  If pt is not established with a provider, would they like to be referred to a provider to establish care? No .   Dental Screening: Recommended annual dental exams for proper oral hygiene  Community Resource Referral / Chronic Care Management: CRR required this visit?  No   CCM required this visit?  No      Plan:     I have personally reviewed and noted the following in the patients chart:   Medical and social history Use of alcohol, tobacco or illicit drugs  Current medications and supplements including opioid prescriptions. Patient is not currently taking opioid prescriptions. Functional ability and status Nutritional status Physical activity Advanced directives List of other physicians Hospitalizations, surgeries, and ER visits in previous 12 months Vitals Screenings to include cognitive, depression, and falls Referrals and appointments  In addition, I have reviewed and discussed with patient certain preventive protocols, quality metrics, and best practice recommendations. A written personalized care plan for preventive services as well as general preventive health recommendations were provided to patient.     Earline Mayotte, [place]   07/05/2021   Nurse Notes:  Mr. Skaggs , Thank you for taking time to come for your Medicare Wellness Visit. I appreciate your ongoing commitment to your health goals. Please review the following plan we discussed and let me know if I can assist you in the future.   These are the goals we discussed:  Goals      Blood Pressure < 140/90     HEMOGLOBIN A1C < 7.0     LDL CALC < 100     Quit smoking / using tobacco        This is a list of the screening recommended for you and due dates:  Health Maintenance  Topic Date Due   Zoster (Shingles) Vaccine (1 of 2) Never done   Colon Cancer Screening  Never done   COVID-19 Vaccine (3 - Pfizer risk series) 09/27/2019   Eye exam for diabetics  07/06/2020    Hemoglobin A1C  06/01/2021   Complete foot exam   04/16/2022   Tetanus Vaccine  05/16/2029   Pneumonia Vaccine  Completed   Flu Shot  Completed   Hepatitis C Screening: USPSTF Recommendation to screen - Ages 18-79 yo.  Completed   HPV Vaccine  Aged Out

## 2021-07-05 NOTE — Patient Instructions (Signed)

## 2021-07-11 ENCOUNTER — Other Ambulatory Visit: Payer: Self-pay | Admitting: Internal Medicine

## 2021-07-11 DIAGNOSIS — E1169 Type 2 diabetes mellitus with other specified complication: Secondary | ICD-10-CM

## 2021-07-18 ENCOUNTER — Ambulatory Visit: Payer: Medicare HMO | Admitting: Internal Medicine

## 2021-08-08 ENCOUNTER — Encounter: Payer: Self-pay | Admitting: Internal Medicine

## 2021-08-08 ENCOUNTER — Encounter (INDEPENDENT_AMBULATORY_CARE_PROVIDER_SITE_OTHER): Payer: Self-pay

## 2021-08-08 ENCOUNTER — Ambulatory Visit (INDEPENDENT_AMBULATORY_CARE_PROVIDER_SITE_OTHER): Payer: Medicare HMO | Admitting: Internal Medicine

## 2021-08-08 ENCOUNTER — Other Ambulatory Visit: Payer: Self-pay

## 2021-08-08 VITALS — BP 118/82 | HR 62 | Resp 18 | Ht 70.0 in | Wt 228.1 lb

## 2021-08-08 DIAGNOSIS — Z125 Encounter for screening for malignant neoplasm of prostate: Secondary | ICD-10-CM | POA: Diagnosis not present

## 2021-08-08 DIAGNOSIS — F320 Major depressive disorder, single episode, mild: Secondary | ICD-10-CM

## 2021-08-08 DIAGNOSIS — I739 Peripheral vascular disease, unspecified: Secondary | ICD-10-CM

## 2021-08-08 DIAGNOSIS — I10 Essential (primary) hypertension: Secondary | ICD-10-CM | POA: Diagnosis not present

## 2021-08-08 DIAGNOSIS — Z0001 Encounter for general adult medical examination with abnormal findings: Secondary | ICD-10-CM | POA: Diagnosis not present

## 2021-08-08 DIAGNOSIS — Z720 Tobacco use: Secondary | ICD-10-CM

## 2021-08-08 DIAGNOSIS — E559 Vitamin D deficiency, unspecified: Secondary | ICD-10-CM | POA: Diagnosis not present

## 2021-08-08 DIAGNOSIS — E1159 Type 2 diabetes mellitus with other circulatory complications: Secondary | ICD-10-CM | POA: Diagnosis not present

## 2021-08-08 DIAGNOSIS — Z122 Encounter for screening for malignant neoplasm of respiratory organs: Secondary | ICD-10-CM

## 2021-08-08 NOTE — Assessment & Plan Note (Addendum)
BP Readings from Last 1 Encounters:  08/08/21 118/82   Well-controlled with Losartan Counseled for compliance with the medications Advised DASH diet and moderate exercise/walking, at least 150 mins/week

## 2021-08-08 NOTE — Assessment & Plan Note (Signed)

## 2021-08-08 NOTE — Patient Instructions (Signed)
Please continue taking medications as prescribed.  Please submit cologuard soon.  Please follow low carb diet and ambulate as tolerated.  You are being referred to Behavioral health therapist.

## 2021-08-08 NOTE — Assessment & Plan Note (Signed)
Smokes about 1 pack/day  Asked about quitting: confirms that he/she currently smokes cigarettes Advise to quit smoking: Educated about QUITTING to reduce the risk of cancer, cardio and cerebrovascular disease. Assess willingness: Unwilling to quit at this time, but is working on cutting back. Assist with counseling and pharmacotherapy: Counseled for 5 minutes and literature provided. Arrange for follow up: follow up in 3 months and continue to offer help. 

## 2021-08-08 NOTE — Assessment & Plan Note (Signed)
Ordered PSA after discussing its limitations for prostate cancer screening, including false positive results leading additional investigations. 

## 2021-08-08 NOTE — Progress Notes (Signed)
Established Patient Office Visit  Subjective:  Patient ID: Alex Marks, male    DOB: 06-27-1946  Age: 76 y.o. MRN: [id number]  CC:  Chief Complaint  Patient presents with   Annual Exam    Annual exam     HPI Alex Marks is a 76 y.o. male with past medical history of HTN, PVD, hep C, type II DM, HLD and depression who presents for annual physical.  HTN: BP is well-controlled. Takes medications regularly. Patient denies headache, dizziness, chest pain, dyspnea or palpitations.  Type II DM: He takes metformin, glipizide and Jardiance for it.  His last Hgb A1c was 7.0.  He denies any fatigue, polyuria or polydipsia.   He has been feeling depressed lately.  He states that he is worried about the conditions in the world, including war and pandemic.  He used to take Lexapro in the past, but did not feel well with it.  He prefers to get only [facility] therapy for now.   Past Medical History:  Diagnosis Date   Anxiety    COPD (chronic obstructive pulmonary disease) ([place])    Dark stools 07/08/2019   Depression    Depression    Frequency of urination    GAD (generalized anxiety disorder)    GERD (gastroesophageal reflux disease)    Hepatitis C    s/p treatment with Dr. Paulita Fujita in 2017. HCV RNA not detected in April 2021   Hypercholesterolemia    Hypertension    PAD (peripheral artery disease) ([place])    Scalp lesion 04/12/2019   Substance abuse ([place])    Transfusion of blood product refused for religious reason    Type II diabetes mellitus [facility])     Past Surgical History:  Procedure Laterality Date   ABDOMINAL AORTOGRAM W/LOWER EXTREMITY Bilateral 05/30/2017   ABDOMINAL AORTOGRAM W/LOWER EXTREMITY N/A 05/30/2017   Procedure: ABDOMINAL AORTOGRAM W/LOWER EXTREMITY;  Surgeon: Elam Dutch, MD;  Location: [facility];  Service: Cardiovascular;  Laterality: N/A;  Bilateral    Family History  Problem Relation Age of Onset   Alzheimer's disease Mother    Mental illness  Mother        Depression   Breast cancer Mother    Cancer Father        bladder?   Hypertension Father    Alcohol abuse Father    Heart disease Sister    Hearing loss Sister        valve   Alcohol abuse Brother    Diabetes Paternal Aunt    COPD Sister    Alcohol abuse Brother    Alcohol abuse Brother    Alcohol abuse Brother    Early death Brother        MVA   Cancer Maternal Grandmother    Colon cancer Neg Hx    Liver cancer Neg Hx     Social History   Socioeconomic History   Marital status: Single    Spouse name: Not on file   Number of children: 0   Years of education: 11   Highest education level: Not on file  Occupational History   Occupation: retired    Comment: labor  Tobacco Use   Smoking status: Former    Packs/day: 1.00    Years: 57.00    Pack years: 57.00    Types: Cigarettes    Start date: 07/01/1961    Quit date: 01/29/2021    Years since quitting: 0.5   Smokeless tobacco: Never  Tobacco comments:    Has tried to quit, but his depressed mood makes it challenging to quit  Vaping Use   Vaping Use: Never used  Substance and Sexual Activity   Alcohol use: Not Currently    Comment: history of heavy alcohol use; last drank alcohol about 3 years ago   Drug use: Not Currently    Types: Marijuana, Cocaine, Heroin, "Crack" cocaine    Comment: Last used heroin in his 26s. Last used cocaine and marijuana about 3 years ago.    Sexual activity: Not Currently  Other Topics Concern   Not on file  Social History Narrative   Single.Live in boarding house   Renting a room   looking for apartment   Never had license   Likes to read   Social Determinants of Health   Financial Resource Strain: Low Risk    Difficulty of Paying Living Expenses: Not hard at all  Food Insecurity: No Food Insecurity   Worried About Charity fundraiser in the Last Year: Never true   Arboriculturist in the Last Year: Never true  Transportation Needs: No Transportation Needs   Lack  of Transportation (Medical): No   Lack of Transportation (Non-Medical): No  Physical Activity: Sufficiently Active   Days of Exercise per Week: 5 days   Minutes of Exercise per Session: 30 min  Stress: No Stress Concern Present   Feeling of Stress : Not at all  Social Connections: Moderately Isolated   Frequency of Communication with Friends and Family: More than three times a week   Frequency of Social Gatherings with Friends and Family: Never   Attends Religious Services: More than 4 times per year   Active Member of [employer] or Organizations: No   Attends Archivist Meetings: Never   Marital Status: Never married  Human resources officer Violence: Not At Risk   Fear of Current or Ex-Partner: No   Emotionally Abused: No   Physically Abused: No   Sexually Abused: No    Outpatient Medications Prior to Visit  Medication Sig Dispense Refill   amLODipine (NORVASC) 10 MG tablet Take 1 tablet (10 mg total) by mouth daily. 90 tablet 1   aspirin 81 MG chewable tablet Chew 1 tablet by mouth daily.     atorvastatin (LIPITOR) 10 MG tablet Take 1 tablet (10 mg total) by mouth daily. 90 tablet 1   blood glucose meter kit and supplies KIT Dx   e11.9 1 each 0   Carboxymethylcell-Glycerin PF (REFRESH OPTIVE PF) 0.5-0.9 % SOLN Place 2 drops into both eyes daily as needed (Irritation).     Cholecalciferol (VITAMIN D3) 25 MCG (1000 UT) CAPS Take 1 capsule (1,000 Units total) by mouth in the morning and at bedtime. 60 capsule 0   fexofenadine (ALLEGRA) 180 MG tablet Take 180 mg by mouth daily.     glipiZIDE (GLUCOTROL) 5 MG tablet Take 1 tablet (5 mg total) by mouth 2 (two) times daily before a meal. TAKE 1 TABLET TWICE DAILY BEFORE MEALS 180 tablet 0   JARDIANCE 10 MG TABS tablet TAKE 1 TABLET(10 MG) BY MOUTH DAILY BEFORE BREAKFAST 30 tablet 2   losartan (COZAAR) 100 MG tablet Take 1 tablet (100 mg total) by mouth daily. 90 tablet 0   metFORMIN (GLUCOPHAGE) 500 MG tablet Take 1 tablet (500 mg total)  by mouth daily with breakfast. 180 tablet 1   Multiple Vitamin (MULTIVITAMIN WITH MINERALS) TABS tablet Take 1 tablet by mouth daily. Senior [employer]  Omega-3 Fatty Acids (FISH OIL PEARLS) 300 MG CAPS Take 600 mg by mouth 2 (two) times daily. (Patient taking differently: Take 1,000 mg by mouth at bedtime.) 60 capsule 3   oxymetazoline (NASAL SPRAY MOISTURIZING 12 HR) 0.05 % nasal spray Place 1 spray into both nostrils daily as needed for congestion.     vitamin C (ASCORBIC ACID) 500 MG tablet Take 500 mg by mouth at bedtime.     No facility-administered medications prior to visit.    Allergies  Allergen Reactions   Onion     Bags under eyes, indigestion    Other Swelling    Hot sauce -- causes eye swelling Spicy food    ROS Review of Systems  Constitutional:  Negative for chills and fever.  HENT:  Negative for congestion and sore throat.   Eyes:  Negative for pain and discharge.  Respiratory:  Negative for cough and shortness of breath.   Cardiovascular:  Negative for chest pain and palpitations.  Gastrointestinal:  Negative for diarrhea, nausea and vomiting.  Endocrine: Negative for polydipsia and polyuria.  Genitourinary:  Negative for dysuria and hematuria.  Musculoskeletal:  Positive for arthralgias. Negative for neck pain and neck stiffness.  Skin:  Negative for rash.  Neurological:  Negative for dizziness, weakness, numbness and headaches.  Psychiatric/Behavioral:  Negative for agitation and behavioral problems.      Objective:    Physical Exam Vitals reviewed.  Constitutional:      General: He is not in acute distress.    Appearance: He is not diaphoretic.  HENT:     Head: Normocephalic and atraumatic.     Nose: Nose normal.     Mouth/Throat:     Mouth: Mucous membranes are moist.  Eyes:     General: No scleral icterus.    Extraocular Movements: Extraocular movements intact.  Cardiovascular:     Rate and Rhythm: Normal rate and regular rhythm.     Heart  sounds: Normal heart sounds. No murmur heard. Pulmonary:     Breath sounds: Normal breath sounds. No wheezing or rales.  Abdominal:     Palpations: Abdomen is soft.     Tenderness: There is no abdominal tenderness.  Musculoskeletal:     Cervical back: Neck supple. No tenderness.     Right lower leg: No edema.     Left lower leg: No edema.  Skin:    General: Skin is warm.     Findings: No rash.  Neurological:     General: No focal deficit present.     Mental Status: He is alert and oriented to person, place, and time. Mental status is at baseline.     Cranial Nerves: No cranial nerve deficit.     Sensory: No sensory deficit.     Motor: Weakness (4/5 in b/l LE) present.  Psychiatric:        Mood and Affect: Mood normal.        Behavior: Behavior normal.    BP 118/82 (BP Location: Right Arm, Patient Position: Sitting, Cuff Size: Normal)    Pulse 62    Resp 18    Ht '5\' 10"'  (1.778 m)    Wt 228 lb 1.9 oz (103.5 kg)    SpO2 97%    BMI 32.73 kg/m  Wt Readings from Last 3 Encounters:  08/08/21 228 lb 1.9 oz (103.5 kg)  04/16/21 237 lb 0.6 oz (107.5 kg)  01/18/21 229 lb (103.9 kg)    Lab Results  Component Value Date  TSH 2.36 07/19/2020   Lab Results  Component Value Date   WBC 11.0 (H) 07/19/2020   HGB 16.3 07/19/2020   HCT 47.7 07/19/2020   MCV 87.0 07/19/2020   PLT 229 07/19/2020   Lab Results  Component Value Date   NA 136 11/30/2020   K 4.9 11/30/2020   CO2 24 11/30/2020   GLUCOSE 92 11/30/2020   BUN 18 11/30/2020   CREATININE 1.21 (H) 11/30/2020   BILITOT 0.6 11/30/2020   ALKPHOS 112 11/16/2019   AST 21 11/30/2020   ALT 24 11/30/2020   PROT 7.3 11/30/2020   ALBUMIN 3.9 11/16/2019   CALCIUM 10.0 11/30/2020   ANIONGAP 11 11/16/2019   Lab Results  Component Value Date   CHOL 118 04/12/2020   Lab Results  Component Value Date   HDL 32 (L) 04/12/2020   Lab Results  Component Value Date   LDLCALC 67 04/12/2020   Lab Results  Component Value Date    TRIG 105 04/12/2020   Lab Results  Component Value Date   CHOLHDL 3.7 04/12/2020   Lab Results  Component Value Date   HGBA1C 7.0 (H) 11/30/2020      Assessment & Plan:   Problem List Items Addressed This Visit    Encounter for general adult medical examination with abnormal findings Physical exam as documented. Counseling done  re healthy lifestyle involving commitment to 150 minutes exercise per week, heart healthy diet, and attaining healthy weight.The importance of adequate sleep also discussed. Changes in health habits are decided on by the patient with goals and time frames  set for achieving them. Immunization and cancer screening needs are specifically addressed at this visit.  Depression Lexapro was discontinued in the last visit as he was feeling better without it Does not want any medication for it for now Refer to [facility] therapy for now  Essential hypertension BP Readings from Last 1 Encounters:  08/08/21 118/82   Well-controlled with Losartan Counseled for compliance with the medications Advised DASH diet and moderate exercise/walking, at least 150 mins/week   PVD (peripheral vascular disease) ([place]) On aspirin and statin  Type 2 diabetes mellitus ([place]) Lab Results  Component Value Date   HGBA1C 7.0 (H) 11/30/2020   On glipizide, metformin and Jardiance Advised to follow diabetic diet On statin and ARB F/u CMP and lipid panel Diabetic eye exam: Advised to follow up with Ophthalmology for diabetic eye exam   Tobacco abuse Smokes about 1 pack/day  Asked about quitting: confirms that he/she currently smokes cigarettes Advise to quit smoking: Educated about QUITTING to reduce the risk of cancer, cardio and cerebrovascular disease. Assess willingness: Unwilling to quit at this time, but is working on cutting back. Assist with counseling and pharmacotherapy: Counseled for 5 minutes and literature provided. Arrange for follow up: follow up in 3 months and  continue to offer help.  Prostate cancer screening Ordered PSA after discussing its limitations for prostate cancer screening, including false positive results leading additional investigations.    Other Visit Diagnoses     Vitamin D deficiency       Relevant Orders   VITAMIN D 25 Hydroxy (Vit-D Deficiency, Fractures)       No orders of the defined types were placed in this encounter.   Follow-up: Return in about 4 months (around 12/06/2021) for DM and HTN.    Lindell Spar, MD

## 2021-08-08 NOTE — Assessment & Plan Note (Signed)
On aspirin and statin °

## 2021-08-08 NOTE — Assessment & Plan Note (Signed)
Lab Results  Component Value Date   HGBA1C 7.0 (H) 11/30/2020   On glipizide, metformin and Jardiance Advised to follow diabetic diet On statin and ARB F/u CMP and lipid panel Diabetic eye exam: Advised to follow up with Ophthalmology for diabetic eye exam

## 2021-08-08 NOTE — Assessment & Plan Note (Signed)
Lexapro was discontinued in the last visit as he was feeling better without it Does not want any medication for it for now Refer to [facility] therapy for now

## 2021-08-09 ENCOUNTER — Telehealth: Payer: Self-pay | Admitting: Internal Medicine

## 2021-08-09 LAB — URINALYSIS
Bilirubin, UA: NEGATIVE
Ketones, UA: NEGATIVE
Leukocytes,UA: NEGATIVE
Nitrite, UA: NEGATIVE
RBC, UA: NEGATIVE
Specific Gravity, UA: 1.009 (ref 1.005–1.030)
Urobilinogen, Ur: 0.2 mg/dL (ref 0.2–1.0)
pH, UA: 6.5 (ref 5.0–7.5)

## 2021-08-09 LAB — CMP14+EGFR
ALT: 30 IU/L (ref 0–44)
AST: 22 IU/L (ref 0–40)
Albumin/Globulin Ratio: 1.4 (ref 1.2–2.2)
Albumin: 4.4 g/dL (ref 3.7–4.7)
Alkaline Phosphatase: 154 IU/L — ABNORMAL HIGH (ref 44–121)
BUN/Creatinine Ratio: 10 (ref 10–24)
BUN: 14 mg/dL (ref 8–27)
Bilirubin Total: 0.4 mg/dL (ref 0.0–1.2)
CO2: 20 mmol/L (ref 20–29)
Calcium: 9.4 mg/dL (ref 8.6–10.2)
Chloride: 106 mmol/L (ref 96–106)
Creatinine, Ser: 1.34 mg/dL — ABNORMAL HIGH (ref 0.76–1.27)
Globulin, Total: 3.1 g/dL (ref 1.5–4.5)
Glucose: 132 mg/dL — ABNORMAL HIGH (ref 70–99)
Potassium: 4.3 mmol/L (ref 3.5–5.2)
Sodium: 140 mmol/L (ref 134–144)
Total Protein: 7.5 g/dL (ref 6.0–8.5)
eGFR: 56 mL/min/{1.73_m2} — ABNORMAL LOW (ref >59)

## 2021-08-09 LAB — TSH: TSH: 2.66 u[IU]/mL (ref 0.450–4.500)

## 2021-08-09 LAB — CBC WITH DIFFERENTIAL/PLATELET
Basophils Absolute: 0.1 10*3/uL (ref 0.0–0.2)
Basos: 1 %
EOS (ABSOLUTE): 0.3 10*3/uL (ref 0.0–0.4)
Eos: 3 %
Hematocrit: 49.5 % (ref 37.5–51.0)
Hemoglobin: 16.4 g/dL (ref 13.0–17.7)
Immature Grans (Abs): 0 10*3/uL (ref 0.0–0.1)
Immature Granulocytes: 0 %
Lymphocytes Absolute: 2.7 10*3/uL (ref 0.7–3.1)
Lymphs: 26 %
MCH: 28.7 pg (ref 26.6–33.0)
MCHC: 33.1 g/dL (ref 31.5–35.7)
MCV: 87 fL (ref 79–97)
Monocytes Absolute: 0.7 10*3/uL (ref 0.1–0.9)
Monocytes: 7 %
Neutrophils Absolute: 6.6 10*3/uL (ref 1.4–7.0)
Neutrophils: 63 %
Platelets: 267 10*3/uL (ref 150–450)
RBC: 5.71 x10E6/uL (ref 4.14–5.80)
RDW: 12.8 % (ref 11.6–15.4)
WBC: 10.4 10*3/uL (ref 3.4–10.8)

## 2021-08-09 LAB — LIPID PANEL
Chol/HDL Ratio: 3.7 ratio (ref 0.0–5.0)
Cholesterol, Total: 117 mg/dL (ref 100–199)
HDL: 32 mg/dL — ABNORMAL LOW (ref >39)
LDL Chol Calc (NIH): 69 mg/dL (ref 0–99)
Triglycerides: 81 mg/dL (ref 0–149)
VLDL Cholesterol Cal: 16 mg/dL (ref 5–40)

## 2021-08-09 LAB — HEMOGLOBIN A1C
Est. average glucose Bld gHb Est-mCnc: 143 mg/dL
Hgb A1c MFr Bld: 6.6 % — ABNORMAL HIGH (ref 4.8–5.6)

## 2021-08-09 LAB — VITAMIN D 25 HYDROXY (VIT D DEFICIENCY, FRACTURES): Vit D, 25-Hydroxy: 40.6 ng/mL (ref 30.0–100.0)

## 2021-08-09 LAB — PSA: Prostate Specific Ag, Serum: 2.4 ng/mL (ref 0.0–4.0)

## 2021-08-09 NOTE — Telephone Encounter (Signed)
Pt advised of lab results with verbal understanding  

## 2021-08-09 NOTE — Telephone Encounter (Signed)
Pt return call for lab results  

## 2021-08-10 ENCOUNTER — Telehealth: Payer: Self-pay | Admitting: *Deleted

## 2021-08-10 NOTE — Chronic Care Management (AMB) (Signed)
Chronic Care Management   Note  08/10/2021 Name: Alex Marks MRN: [id number] DOB: Dec 17, 1945  Alex Marks is a 76 y.o. year old male who is a primary care patient of Lindell Spar, MD. I reached out to [name] by phone today in response to a referral sent by [name] PCP.  [name] was given information about Chronic Care Management services today including:  CCM service includes personalized support from designated clinical staff supervised by his physician, including individualized plan of care and coordination with other care providers 24/7 contact phone numbers for assistance for urgent and routine care needs. Service will only be billed when office clinical staff spend 20 minutes or more in a month to coordinate care. Only one practitioner may furnish and bill the service in a calendar month. The patient may stop CCM services at any time (effective at the end of the month) by phone call to the office staff. The patient is responsible for co-pay (up to 20% after annual deductible is met) if co-pay is required by the individual health plan.   Patient agreed to services and verbal consent obtained.   Follow up plan: Face to Face appointment with care management team member scheduled for: 08/24/21  [place] Management  Direct Dial: [phone]

## 2021-08-10 NOTE — Chronic Care Management (AMB) (Signed)
°  Chronic Care Management   Outreach Note  08/10/2021 Name: Alex Marks MRN: [id number] DOB: 07-07-1945  Alex Marks is a 76 y.o. year old male who is a primary care patient of Anabel Halon, MD. I reached out to [name] by phone today in response to a referral sent by [name]'s primary care provider.  An unsuccessful telephone outreach was attempted today. The patient was referred to the case management team for assistance with care management and care coordination.   Follow Up Plan: A HIPAA compliant phone message was left for the patient providing contact information and requesting a return call. The care management team will reach out to the patient again over the next 7 days. If patient returns call to provider office, please advise to call Embedded Care Management Care Guide Misty Stanley at [phone].  Gwenevere Ghazi  Care Guide, Embedded Care Coordination [facility] Management  Direct Dial: [phone]

## 2021-08-24 ENCOUNTER — Other Ambulatory Visit: Payer: Self-pay

## 2021-08-24 ENCOUNTER — Ambulatory Visit (INDEPENDENT_AMBULATORY_CARE_PROVIDER_SITE_OTHER): Payer: Medicare HMO

## 2021-08-24 DIAGNOSIS — F320 Major depressive disorder, single episode, mild: Secondary | ICD-10-CM

## 2021-08-24 DIAGNOSIS — E1159 Type 2 diabetes mellitus with other circulatory complications: Secondary | ICD-10-CM

## 2021-08-24 DIAGNOSIS — I10 Essential (primary) hypertension: Secondary | ICD-10-CM
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
## 2021-08-24 NOTE — Patient Instructions (Addendum)
Visit Information  Patient Goals:   Depressive Symptoms Identified. Manage Depression issues  Time Frame:  Short Term Goal Priority:  Medium Progress: Not On Track  Start Date:  08/24/21 End Date: 11/16/21  Follow Up Date:  09/21/21 at 2:00 PM  Depressive Symptoms Identified. Manage Depression issues  Patient Self Care Activities:  Takes medications as prescribed  Attends medical appointments Gets adequate rest  Patient Coping Strengths:  Clearence Cheek, [place] is supportive  Patient Self Care Deficits:  Depression issues Some walking challenges  Patient Goals:  - spend time or talk with others at least 2 to 3 times per week - practice relaxation or meditation daily - keep a calendar with appointment dates  Follow Up Plan: LCSW to call client on 09/21/21 at 2:00 PM    Kelton Pillar.Johnathon Mittal MSW, LCSW Licensed Visual merchandiser [facility] Care Management [phone]

## 2021-08-28 DIAGNOSIS — E1159 Type 2 diabetes mellitus with other circulatory complications: Secondary | ICD-10-CM

## 2021-08-28 DIAGNOSIS — F320 Major depressive disorder, single episode, mild: Secondary | ICD-10-CM

## 2021-08-28 DIAGNOSIS — I10 Essential (primary) hypertension: Secondary | ICD-10-CM

## 2021-09-19 ENCOUNTER — Telehealth: Payer: Self-pay

## 2021-09-19 NOTE — Progress Notes (Signed)
Error opened in wrong context  

## 2021-09-19 NOTE — Chronic Care Management (AMB) (Signed)
?  Care Management  ? ?Note ? ?09/19/2021 ?Name: Alex Marks MRN: [id number] DOB: 10/24/1945 ? ?Alex Marks is a 76 y.o. year old male who is a primary care patient of Lindell Spar, MD and is actively engaged with the care management team. I reached out to [name] by phone today to assist with re-scheduling a follow up visit with the Licensed Clinical Social Worker ? ?Follow up plan: ?Unsuccessful telephone outreach attempt made. A HIPAA compliant phone message was left for the patient providing contact information and requesting a return call.  ?The care management team will reach out to the patient again over the next 3 days.  ?If patient returns call to provider office, please advise to call [facility]  at [phone] ? ?Noreene Larsson, RMA ?Care Guide, Embedded Care Coordination ?[facility]  Care Management  ?[place], [place] [address] ?Direct Dial: [phone] ?[email or web address] ?Website: [email or web address]  ? ?

## 2021-09-19 NOTE — Chronic Care Management (AMB) (Signed)
?  Care Management  ? ?Note ? ?09/19/2021 ?Name: Alex Marks MRN: [id number] DOB: 1946-02-27 ? ?Alex Marks is a 76 y.o. year old male who is a primary care patient of Anabel Halon, MD and is actively engaged with the care management team. I reached out to [name] by phone today to assist with re-scheduling a follow up visit with the Licensed Clinical Social Worker ? ?Follow up plan: ?Patient declines further follow up and engagement by the care management team. Appropriate care team members and provider have been notified via electronic communication.  ? ?Penne Lash, RMA ?Care Guide, Embedded Care Coordination ?[facility]  Care Management  ?[place], [place] [address] ?Direct Dial: [phone] ?[email or web address] ?Website: [email or web address]  ? ?

## 2021-09-21 ENCOUNTER — Telehealth: Payer: Medicare HMO

## 2021-10-04 ENCOUNTER — Other Ambulatory Visit: Payer: Self-pay | Admitting: Internal Medicine

## 2021-10-04 ENCOUNTER — Telehealth: Payer: Self-pay | Admitting: Internal Medicine

## 2021-10-04 DIAGNOSIS — E1151 Type 2 diabetes mellitus with diabetic peripheral angiopathy without gangrene: Secondary | ICD-10-CM | POA: Diagnosis not present

## 2021-10-04 DIAGNOSIS — L84 Corns and callosities: Secondary | ICD-10-CM | POA: Diagnosis not present

## 2021-10-04 DIAGNOSIS — I739 Peripheral vascular disease, unspecified: Secondary | ICD-10-CM | POA: Diagnosis not present

## 2021-10-04 DIAGNOSIS — I10 Essential (primary) hypertension: Secondary | ICD-10-CM

## 2021-10-04 DIAGNOSIS — L603 Nail dystrophy: Secondary | ICD-10-CM | POA: Diagnosis not present

## 2021-10-04 NOTE — Telephone Encounter (Signed)
PATIENT NEEDS REFILL ON  ? ? ?losartan (COZAAR) 100 MG tablet  ? ?Will take last tab on Sunday  ?

## 2021-10-06 ENCOUNTER — Other Ambulatory Visit: Payer: Self-pay | Admitting: Internal Medicine

## 2021-10-06 DIAGNOSIS — E782 Mixed hyperlipidemia: Secondary | ICD-10-CM

## 2021-10-06 DIAGNOSIS — E1169 Type 2 diabetes mellitus with other specified complication: Secondary | ICD-10-CM

## 2021-10-08 ENCOUNTER — Telehealth: Payer: Self-pay

## 2021-10-08 ENCOUNTER — Other Ambulatory Visit: Payer: Self-pay | Admitting: *Deleted

## 2021-10-08 DIAGNOSIS — I10 Essential (primary) hypertension: Secondary | ICD-10-CM

## 2021-10-08 MED ORDER — AMLODIPINE BESYLATE 10 MG PO TABS: 10.0000 mg | ORAL_TABLET | Freq: Every day | ORAL | 1 refills | Status: DC

## 2021-10-08 NOTE — Telephone Encounter (Signed)
Medication sent to pharmacy  

## 2021-10-08 NOTE — Telephone Encounter (Signed)
Sent 10-04-21 ?

## 2021-10-08 NOTE — Telephone Encounter (Signed)
Patient called need med refill asking for more pills has no car. Asking to pick up at one time instead of driving back and forth to the pharmacy. ? ?amLODipine (NORVASC) 10 MG tablet  ? ?JARDIANCE 10 MG TABS tablet ? ?Pharmacy [facility] ?

## 2021-12-07 ENCOUNTER — Ambulatory Visit (INDEPENDENT_AMBULATORY_CARE_PROVIDER_SITE_OTHER): Payer: Medicare HMO | Admitting: Internal Medicine

## 2021-12-07 ENCOUNTER — Encounter: Payer: Self-pay | Admitting: Internal Medicine

## 2021-12-07 VITALS — BP 118/84 | HR 69 | Resp 18 | Ht 70.0 in | Wt 230.2 lb

## 2021-12-07 DIAGNOSIS — E1159 Type 2 diabetes mellitus with other circulatory complications: Secondary | ICD-10-CM | POA: Diagnosis not present

## 2021-12-07 DIAGNOSIS — J432 Centrilobular emphysema: Secondary | ICD-10-CM

## 2021-12-07 DIAGNOSIS — I10 Essential (primary) hypertension: Secondary | ICD-10-CM | POA: Diagnosis not present

## 2021-12-07 DIAGNOSIS — Z720 Tobacco use: Secondary | ICD-10-CM

## 2021-12-07 DIAGNOSIS — I700 Atherosclerosis of aorta: Secondary | ICD-10-CM | POA: Diagnosis not present

## 2021-12-07 DIAGNOSIS — N1831 Chronic kidney disease, stage 3a: Secondary | ICD-10-CM

## 2021-12-07 MED ORDER — BLOOD GLUCOSE METER KIT: PACK | 0 refills | Status: DC

## 2021-12-07 NOTE — Assessment & Plan Note (Signed)
Last CMP showed GFR of 58 On losartan and Jardiance Check BMP and urine microalbumin/creatinine ratio Avoid nephrotoxic agents Advised to maintain adequate hydration -needs to cut down soft drinks intake

## 2021-12-07 NOTE — Assessment & Plan Note (Signed)
Noted on CT chest Has been trying to cut down smoking Asymptomatic currently 

## 2021-12-07 NOTE — Progress Notes (Signed)
Established Patient Office Visit  Subjective:  Patient ID: Alex Marks, male    DOB: 09-Aug-1945  Age: 76 y.o. MRN: [id number]  CC:  Chief Complaint  Patient presents with   Follow-up    4 month follow up DM and HTN     HPI Alex Marks is a 76 y.o. male with past medical history of HTN, PVD, hep C, type II DM, HLD and depression who presents for f/u of his chronic medical conditions.  HTN: BP is well-controlled. Takes medications regularly. Patient denies headache, dizziness, chest pain, dyspnea or palpitations.   Type II DM: He takes metformin, glipizide and Jardiance for it.  His last Hgb A1c was 6.6. He does not check blood glucose at home. He denies any fatigue, polyuria or polydipsia.   CKD: Last CMP showed GFR of 56.  Denies any dysuria, hematuria, urinary hesitance or resistance.    Past Medical History:  Diagnosis Date   Anxiety    COPD (chronic obstructive pulmonary disease) ([place])    Dark stools 07/08/2019   Depression    Depression    Frequency of urination    GAD (generalized anxiety disorder)    GERD (gastroesophageal reflux disease)    Hepatitis C    s/p treatment with Dr. Paulita Fujita in 2017. HCV RNA not detected in April 2021   Hypercholesterolemia    Hypertension    PAD (peripheral artery disease) ([place])    Scalp lesion 04/12/2019   Substance abuse ([place])    Transfusion of blood product refused for religious reason    Type II diabetes mellitus [facility])     Past Surgical History:  Procedure Laterality Date   ABDOMINAL AORTOGRAM W/LOWER EXTREMITY Bilateral 05/30/2017   ABDOMINAL AORTOGRAM W/LOWER EXTREMITY N/A 05/30/2017   Procedure: ABDOMINAL AORTOGRAM W/LOWER EXTREMITY;  Surgeon: Elam Dutch, MD;  Location: [facility];  Service: Cardiovascular;  Laterality: N/A;  Bilateral    Family History  Problem Relation Age of Onset   Alzheimer's disease Mother    Mental illness Mother        Depression   Breast cancer Mother    Cancer Father         bladder?   Hypertension Father    Alcohol abuse Father    Heart disease Sister    Hearing loss Sister        valve   Alcohol abuse Brother    Diabetes Paternal Aunt    COPD Sister    Alcohol abuse Brother    Alcohol abuse Brother    Alcohol abuse Brother    Early death Brother        MVA   Cancer Maternal Grandmother    Colon cancer Neg Hx    Liver cancer Neg Hx     Social History   Socioeconomic History   Marital status: Single    Spouse name: Not on file   Number of children: 0   Years of education: 11   Highest education level: Not on file  Occupational History   Occupation: retired    Comment: labor  Tobacco Use   Smoking status: Former    Packs/day: 1.00    Years: 57.00    Total pack years: 57.00    Types: Cigarettes    Start date: 07/01/1961    Quit date: 01/29/2021    Years since quitting: 0.8   Smokeless tobacco: Never   Tobacco comments:    Has tried to quit, but his depressed mood makes  it challenging to quit  Vaping Use   Vaping Use: Never used  Substance and Sexual Activity   Alcohol use: Not Currently    Comment: history of heavy alcohol use; last drank alcohol about 3 years ago   Drug use: Not Currently    Types: Marijuana, Cocaine, Heroin, "Crack" cocaine    Comment: Last used heroin in his 21s. Last used cocaine and marijuana about 3 years ago.    Sexual activity: Not Currently  Other Topics Concern   Not on file  Social History Narrative   Single.Live in boarding house   Renting a room   looking for apartment   Never had license   Likes to read   Social Determinants of [place] Strain: Low Risk  (07/05/2021)   Overall Financial Resource Strain (CARDIA)    Difficulty of Paying Living Expenses: Not hard at all  Food Insecurity: No Food Insecurity (07/05/2021)   Hunger Vital Sign    Worried About Running Out of Food in the Last Year: Never true    Ran Out of Food in the Last Year: Never true  Transportation Needs: No  Transportation Needs (07/05/2021)   PRAPARE - Hydrologist (Medical): No    Lack of Transportation (Non-Medical): No  Physical Activity: Inactive (08/24/2021)   Exercise Vital Sign    Days of Exercise per Week: 0 days    Minutes of Exercise per Session: 0 min  Stress: Stress Concern Present (08/24/2021)   [place]    Feeling of Stress : To some extent  Social Connections: Moderately Isolated (07/05/2021)   Social Connection and Isolation Panel [NHANES]    Frequency of Communication with Friends and Family: More than three times a week    Frequency of Social Gatherings with Friends and Family: Never    Attends Religious Services: More than 4 times per year    Active Member of [employer] or Organizations: No    Attends Archivist Meetings: Never    Marital Status: Never married  Intimate Partner Violence: Not At Risk (07/05/2021)   Humiliation, Afraid, Rape, and Kick questionnaire    Fear of Current or Ex-Partner: No    Emotionally Abused: No    Physically Abused: No    Sexually Abused: No    Outpatient Medications Prior to Visit  Medication Sig Dispense Refill   amLODipine (NORVASC) 10 MG tablet Take 1 tablet (10 mg total) by mouth daily. 90 tablet 1   aspirin 81 MG chewable tablet Chew 1 tablet by mouth daily.     atorvastatin (LIPITOR) 10 MG tablet TAKE 1 TABLET(10 MG) BY MOUTH DAILY 90 tablet 1   blood glucose meter kit and supplies KIT Dx   e11.9 1 each 0   Carboxymethylcell-Glycerin PF (REFRESH OPTIVE PF) 0.5-0.9 % SOLN Place 2 drops into both eyes daily as needed (Irritation).     Cholecalciferol (VITAMIN D3) 25 MCG (1000 UT) CAPS Take 1 capsule (1,000 Units total) by mouth in the morning and at bedtime. 60 capsule 0   fexofenadine (ALLEGRA) 180 MG tablet Take 180 mg by mouth daily.     glipiZIDE (GLUCOTROL) 5 MG tablet Take 1 tablet (5 mg total) by mouth 2 (two) times daily before a  meal. TAKE 1 TABLET TWICE DAILY BEFORE MEALS 180 tablet 0   JARDIANCE 10 MG TABS tablet TAKE 1 TABLET(10 MG) BY MOUTH DAILY BEFORE BREAKFAST 30 tablet 2  losartan (COZAAR) 100 MG tablet TAKE 1 TABLET(100 MG) BY MOUTH DAILY 90 tablet 0   metFORMIN (GLUCOPHAGE) 500 MG tablet Take 1 tablet (500 mg total) by mouth daily with breakfast. 180 tablet 1   Multiple Vitamin (MULTIVITAMIN WITH MINERALS) TABS tablet Take 1 tablet by mouth daily. Senior Centrum     Omega-3 Fatty Acids (FISH OIL PEARLS) 300 MG CAPS Take 600 mg by mouth 2 (two) times daily. (Patient taking differently: Take 1,000 mg by mouth at bedtime.) 60 capsule 3   oxymetazoline (NASAL SPRAY MOISTURIZING 12 HR) 0.05 % nasal spray Place 1 spray into both nostrils daily as needed for congestion.     vitamin C (ASCORBIC ACID) 500 MG tablet Take 500 mg by mouth at bedtime.     No facility-administered medications prior to visit.    Allergies  Allergen Reactions   Onion     Bags under eyes, indigestion    Other Swelling    Hot sauce -- causes eye swelling Spicy food    ROS Review of Systems  Constitutional:  Negative for chills and fever.  HENT:  Negative for congestion and sore throat.   Eyes:  Negative for pain and discharge.  Respiratory:  Negative for cough and shortness of breath.   Cardiovascular:  Negative for chest pain and palpitations.  Gastrointestinal:  Negative for diarrhea, nausea and vomiting.  Endocrine: Negative for polydipsia and polyuria.  Genitourinary:  Negative for dysuria and hematuria.  Musculoskeletal:  Positive for arthralgias. Negative for neck pain and neck stiffness.  Skin:  Negative for rash.  Neurological:  Negative for dizziness, weakness, numbness and headaches.  Psychiatric/Behavioral:  Negative for agitation and behavioral problems.       Objective:    Physical Exam Vitals reviewed.  Constitutional:      General: He is not in acute distress.    Appearance: He is not diaphoretic.  HENT:      Head: Normocephalic and atraumatic.     Nose: Nose normal.     Mouth/Throat:     Mouth: Mucous membranes are moist.  Eyes:     General: No scleral icterus.    Extraocular Movements: Extraocular movements intact.  Cardiovascular:     Rate and Rhythm: Normal rate and regular rhythm.     Heart sounds: Normal heart sounds. No murmur heard. Pulmonary:     Breath sounds: Normal breath sounds. No wheezing or rales.  Abdominal:     Palpations: Abdomen is soft.     Tenderness: There is no abdominal tenderness.  Musculoskeletal:     Cervical back: Neck supple. No tenderness.     Right lower leg: No edema.     Left lower leg: No edema.  Skin:    General: Skin is warm.     Findings: No rash.  Neurological:     General: No focal deficit present.     Mental Status: He is alert and oriented to person, place, and time. Mental status is at baseline.     Cranial Nerves: No cranial nerve deficit.     Sensory: No sensory deficit.     Motor: Weakness (4/5 in b/l LE) present.  Psychiatric:        Mood and Affect: Mood normal.        Behavior: Behavior normal.     BP 118/84 (BP Location: Right Arm, Patient Position: Sitting, Cuff Size: Normal)   Pulse 69   Resp 18   Ht '5\' 10"'  (1.778 m)   Wt 230  lb 3.2 oz (104.4 kg)   SpO2 95%   BMI 33.03 kg/m  Wt Readings from Last 3 Encounters:  12/07/21 230 lb 3.2 oz (104.4 kg)  08/08/21 228 lb 1.9 oz (103.5 kg)  04/16/21 237 lb 0.6 oz (107.5 kg)    Lab Results  Component Value Date   TSH 2.660 08/08/2021   Lab Results  Component Value Date   WBC 10.4 08/08/2021   HGB 16.4 08/08/2021   HCT 49.5 08/08/2021   MCV 87 08/08/2021   PLT 267 08/08/2021   Lab Results  Component Value Date   NA 140 08/08/2021   K 4.3 08/08/2021   CO2 20 08/08/2021   GLUCOSE 132 (H) 08/08/2021   BUN 14 08/08/2021   CREATININE 1.34 (H) 08/08/2021   BILITOT 0.4 08/08/2021   ALKPHOS 154 (H) 08/08/2021   AST 22 08/08/2021   ALT 30 08/08/2021   PROT 7.5  08/08/2021   ALBUMIN 4.4 08/08/2021   CALCIUM 9.4 08/08/2021   ANIONGAP 11 11/16/2019   EGFR 56 (L) 08/08/2021   Lab Results  Component Value Date   CHOL 117 08/08/2021   Lab Results  Component Value Date   HDL 32 (L) 08/08/2021   Lab Results  Component Value Date   LDLCALC 69 08/08/2021   Lab Results  Component Value Date   TRIG 81 08/08/2021   Lab Results  Component Value Date   CHOLHDL 3.7 08/08/2021   Lab Results  Component Value Date   HGBA1C 6.6 (H) 08/08/2021      Assessment & Plan:   Problem List Items Addressed This Visit       Cardiovascular and Mediastinum   Essential hypertension    BP Readings from Last 1 Encounters:  12/07/21 118/84  Well-controlled with Losartan Counseled for compliance with the medications Advised DASH diet and moderate exercise/walking, at least 150 mins/week      Aortic atherosclerosis ([place])    Noted on CT chest On aspirin and statin        Respiratory   Centrilobular emphysema ([place])    Noted on CT chest Has been trying to cut down smoking Asymptomatic currently        Endocrine   Type 2 diabetes mellitus ([place]) - Primary    Lab Results  Component Value Date   HGBA1C 6.6 (H) 08/08/2021  Well-controlled On glipizide, metformin and Jardiance Advised to follow diabetic diet On statin and ARB F/u CMP and lipid panel Diabetic eye exam: Advised to follow up with Ophthalmology for diabetic eye exam      Relevant Orders   Microalbumin / creatinine urine ratio   Basic Metabolic Panel (BMET)   Hemoglobin A1c     Genitourinary   Stage 3a chronic kidney disease (HCC)    Last CMP showed GFR of 58 On losartan and Jardiance Check BMP and urine microalbumin/creatinine ratio Avoid nephrotoxic agents Advised to maintain adequate hydration -needs to cut down soft drinks intake         Other   Tobacco abuse    Smokes about 3 cigarettes/day  Asked about quitting: confirms that he/she currently smokes  cigarettes Advise to quit smoking: Educated about QUITTING to reduce the risk of cancer, cardio and cerebrovascular disease. Assess willingness: Unwilling to quit at this time, but is working on cutting back. Assist with counseling and pharmacotherapy: Counseled for 5 minutes and literature provided. Arrange for follow up: follow up in 3 months and continue to offer help.  Meds ordered this encounter  Medications   blood glucose meter kit and supplies    Sig: Dispense based on patient and insurance preference. Use up to four times daily as directed. (FOR ICD-10 E10.9, E11.9).    Dispense:  1 each    Refill:  0    Order Specific Question:   Number of strips    Answer:   100    Order Specific Question:   Number of lancets    Answer:   100    Follow-up: Return in about 4 months (around 04/08/2022) for DM and CKD.    Lindell Spar, MD

## 2021-12-07 NOTE — Patient Instructions (Signed)
Please continue taking medications as prescribed.  Please continue to cut down -> quit smoking.  Please continue to follow low carb diet and ambulate as tolerated.

## 2021-12-07 NOTE — Assessment & Plan Note (Signed)
BP Readings from Last 1 Encounters:  12/07/21 118/84   Well-controlled with Losartan Counseled for compliance with the medications Advised DASH diet and moderate exercise/walking, at least 150 mins/week

## 2021-12-07 NOTE — Assessment & Plan Note (Signed)
Noted on CT chest On aspirin and statin 

## 2021-12-07 NOTE — Assessment & Plan Note (Addendum)
Lab Results  Component Value Date   HGBA1C 6.6 (H) 08/08/2021   Well-controlled On glipizide, metformin and Jardiance Advised to follow diabetic diet On statin and ARB F/u CMP and lipid panel Diabetic eye exam: Advised to follow up with Ophthalmology for diabetic eye exam

## 2021-12-09 LAB — BASIC METABOLIC PANEL
BUN/Creatinine Ratio: 16 (ref 10–24)
BUN: 22 mg/dL (ref 8–27)
CO2: 21 mmol/L (ref 20–29)
Calcium: 9.4 mg/dL (ref 8.6–10.2)
Chloride: 104 mmol/L (ref 96–106)
Creatinine, Ser: 1.39 mg/dL — ABNORMAL HIGH (ref 0.76–1.27)
Glucose: 119 mg/dL — ABNORMAL HIGH (ref 70–99)
Potassium: 4.7 mmol/L (ref 3.5–5.2)
Sodium: 141 mmol/L (ref 134–144)
eGFR: 53 mL/min/{1.73_m2} — ABNORMAL LOW (ref >59)

## 2021-12-09 LAB — MICROALBUMIN / CREATININE URINE RATIO
Creatinine, Urine: 16.5 mg/dL
Microalb/Creat Ratio: 425 mg/g creat — ABNORMAL HIGH (ref 0–29)
Microalbumin, Urine: 70.2 ug/mL

## 2021-12-09 LAB — HEMOGLOBIN A1C
Est. average glucose Bld gHb Est-mCnc: 137 mg/dL
Hgb A1c MFr Bld: 6.4 % — ABNORMAL HIGH (ref 4.8–5.6)

## 2022-01-02 ENCOUNTER — Telehealth: Payer: Self-pay

## 2022-01-02 ENCOUNTER — Other Ambulatory Visit: Payer: Self-pay | Admitting: Internal Medicine

## 2022-01-02 DIAGNOSIS — I10 Essential (primary) hypertension: Secondary | ICD-10-CM

## 2022-01-02 NOTE — Telephone Encounter (Signed)
Losartan was just filled this morning please have him check back with pharmacy

## 2022-01-02 NOTE — Telephone Encounter (Signed)
Patient called need med refill pharmacy has not yet received the medication.  losartan (COZAAR) 100 MG tablet   Pharmacy: [facility] [place]

## 2022-01-05 ENCOUNTER — Other Ambulatory Visit: Payer: Self-pay | Admitting: Internal Medicine

## 2022-01-05 DIAGNOSIS — E1169 Type 2 diabetes mellitus with other specified complication: Secondary | ICD-10-CM

## 2022-01-06 ENCOUNTER — Other Ambulatory Visit: Payer: Self-pay | Admitting: Internal Medicine

## 2022-01-06 DIAGNOSIS — E1169 Type 2 diabetes mellitus with other specified complication: Secondary | ICD-10-CM

## 2022-01-07 ENCOUNTER — Telehealth: Payer: Self-pay | Admitting: Internal Medicine

## 2022-01-07 ENCOUNTER — Other Ambulatory Visit: Payer: Self-pay | Admitting: *Deleted

## 2022-01-07 DIAGNOSIS — E782 Mixed hyperlipidemia: Secondary | ICD-10-CM

## 2022-01-07 MED ORDER — ATORVASTATIN CALCIUM 10 MG PO TABS: ORAL_TABLET | ORAL | 1 refills | Status: DC

## 2022-01-07 NOTE — Telephone Encounter (Signed)
Patient medication sent to pharmacy  

## 2022-01-07 NOTE — Telephone Encounter (Signed)
Pt called stating that he is needing a new rx for atorvastatin (LIPITOR) 10 MG tablet , glipiZIDE (GLUCOTROL) 5 MG tablet and JARDIANCE 10 MG TABS tablet . Can you please refill?         [facility] [address]

## 2022-01-14 DIAGNOSIS — L84 Corns and callosities: Secondary | ICD-10-CM | POA: Diagnosis not present

## 2022-01-14 DIAGNOSIS — L603 Nail dystrophy: Secondary | ICD-10-CM | POA: Diagnosis not present

## 2022-01-14 DIAGNOSIS — E1151 Type 2 diabetes mellitus with diabetic peripheral angiopathy without gangrene: Secondary | ICD-10-CM | POA: Diagnosis not present

## 2022-01-14 DIAGNOSIS — I739 Peripheral vascular disease, unspecified: Secondary | ICD-10-CM | POA: Diagnosis not present

## 2022-04-02 ENCOUNTER — Other Ambulatory Visit: Payer: Self-pay | Admitting: Internal Medicine

## 2022-04-02 DIAGNOSIS — I10 Essential (primary) hypertension: Secondary | ICD-10-CM

## 2022-04-07 ENCOUNTER — Other Ambulatory Visit: Payer: Self-pay | Admitting: Internal Medicine

## 2022-04-07 DIAGNOSIS — E1169 Type 2 diabetes mellitus with other specified complication: Secondary | ICD-10-CM

## 2022-04-08 ENCOUNTER — Ambulatory Visit: Payer: Medicare HMO | Admitting: Internal Medicine

## 2022-04-09 ENCOUNTER — Other Ambulatory Visit: Payer: Self-pay | Admitting: Internal Medicine

## 2022-04-09 DIAGNOSIS — E1169 Type 2 diabetes mellitus with other specified complication: Secondary | ICD-10-CM

## 2022-04-15 ENCOUNTER — Ambulatory Visit: Payer: Medicare HMO | Admitting: Internal Medicine

## 2022-04-23 ENCOUNTER — Ambulatory Visit: Payer: Medicare HMO | Admitting: Internal Medicine

## 2022-04-23 ENCOUNTER — Ambulatory Visit: Payer: Medicare HMO

## 2022-04-25 ENCOUNTER — Ambulatory Visit: Payer: Medicare HMO | Admitting: Internal Medicine

## 2022-04-26 ENCOUNTER — Ambulatory Visit (INDEPENDENT_AMBULATORY_CARE_PROVIDER_SITE_OTHER): Payer: Medicare HMO | Admitting: Internal Medicine

## 2022-04-26 ENCOUNTER — Encounter: Payer: Self-pay | Admitting: Internal Medicine

## 2022-04-26 VITALS — BP 124/82 | HR 71 | Resp 18 | Ht 70.0 in | Wt 233.0 lb

## 2022-04-26 DIAGNOSIS — I739 Peripheral vascular disease, unspecified: Secondary | ICD-10-CM

## 2022-04-26 DIAGNOSIS — N1831 Chronic kidney disease, stage 3a: Secondary | ICD-10-CM | POA: Diagnosis not present

## 2022-04-26 DIAGNOSIS — F331 Major depressive disorder, recurrent, moderate: Secondary | ICD-10-CM | POA: Diagnosis not present

## 2022-04-26 DIAGNOSIS — Z720 Tobacco use: Secondary | ICD-10-CM | POA: Diagnosis not present

## 2022-04-26 DIAGNOSIS — E782 Mixed hyperlipidemia: Secondary | ICD-10-CM | POA: Diagnosis not present

## 2022-04-26 DIAGNOSIS — J432 Centrilobular emphysema: Secondary | ICD-10-CM | POA: Diagnosis not present

## 2022-04-26 DIAGNOSIS — I10 Essential (primary) hypertension: Secondary | ICD-10-CM | POA: Diagnosis not present

## 2022-04-26 DIAGNOSIS — Z122 Encounter for screening for malignant neoplasm of respiratory organs: Secondary | ICD-10-CM | POA: Diagnosis not present

## 2022-04-26 DIAGNOSIS — E1169 Type 2 diabetes mellitus with other specified complication: Secondary | ICD-10-CM

## 2022-04-26 DIAGNOSIS — E1159 Type 2 diabetes mellitus with other circulatory complications: Secondary | ICD-10-CM

## 2022-04-26 MED ORDER — BUPROPION HCL ER (XL) 150 MG PO TB24: 150.0000 mg | ORAL_TABLET | Freq: Every day | ORAL | 3 refills | Status: DC

## 2022-04-26 MED ORDER — METFORMIN HCL 500 MG PO TABS: 500.0000 mg | ORAL_TABLET | Freq: Every day | ORAL | 1 refills | Status: DC

## 2022-04-26 NOTE — Assessment & Plan Note (Signed)
Lab Results  Component Value Date   HGBA1C 6.4 (H) 12/07/2021   Well-controlled On glipizide, metformin and Jardiance Advised to follow diabetic diet On statin and ARB F/u CMP and lipid panel Diabetic eye exam: Advised to follow up with Ophthalmology for diabetic eye exam

## 2022-04-26 NOTE — Assessment & Plan Note (Signed)
BP Readings from Last 1 Encounters:  04/26/22 124/82   Well-controlled with Losartan Counseled for compliance with the medications Advised DASH diet and moderate exercise/walking, at least 150 mins/week

## 2022-04-26 NOTE — Assessment & Plan Note (Signed)
Lexapro was discontinued in the last visit as he was feeling better without it Prefers to take Valium Started Wellbutrin for MDD and anxiety If persistent anxiety, may add Valium Refer to [facility] therapy for now

## 2022-04-26 NOTE — Assessment & Plan Note (Signed)
Noted on CT chest Has been trying to cut down smoking Asymptomatic currently 

## 2022-04-26 NOTE — Assessment & Plan Note (Signed)
Last CMP showed GFR of 53, overall stable Check BMP  Checked urine microalbumin/creatinine ratio - On losartan and Jardiance Avoid nephrotoxic agents Advised to maintain adequate hydration -needs to cut down soft drinks intake

## 2022-04-26 NOTE — Assessment & Plan Note (Signed)
On aspirin and statin Has claudication symptoms Has weak pulse on DPA Offered vascular surgery referral, but he denies

## 2022-04-26 NOTE — Assessment & Plan Note (Signed)
On statin Check lipid profile 

## 2022-04-26 NOTE — Assessment & Plan Note (Signed)
Smokes about 1 pack/day now  Asked about quitting: confirms that he/she currently smokes cigarettes Advise to quit smoking: Educated about QUITTING to reduce the risk of cancer, cardio and cerebrovascular disease. Assess willingness: Unwilling to quit at this time, but is working on cutting back.  Started Wellbutrin for MDD and anxiety. Assist with counseling and pharmacotherapy: Counseled for 5 minutes and literature provided. Arrange for follow up: follow up in 3 months and continue to offer help.

## 2022-04-26 NOTE — Progress Notes (Signed)
Established Patient Office Visit  Subjective:  Patient ID: Alex Marks, male    DOB: 1946-06-23  Age: 76 y.o. MRN: [id number]  CC:  Chief Complaint  Patient presents with   Follow-up    4 month follow up DM CKD pt losing balance and legs are stiff making it hard for him to walk    HPI Alex Marks is a 76 y.o. male with past medical history of HTN, PVD, hep C, type II DM, HLD and depression who presents for f/u of his chronic medical conditions.  He complains of bilateral leg weakness and pain upon walking.  He also has gait disturbance.  Denies any numbness or tingling currently.  He has weak DPA pulse.  He denies vascular surgery referral for now.  HTN: BP is well-controlled. Takes medications regularly. Patient denies headache, dizziness, chest pain, dyspnea or palpitations.   Type II DM: He takes metformin, glipizide and Jardiance for it.  His last Hgb A1c was 6.4. He does not check blood glucose at home. He denies any fatigue, polyuria or polydipsia.    CKD: Last CMP showed GFR of 53.  Denies any dysuria, hematuria, urinary hesitance or resistance.  MDD: He reports anhedonia, anxiety/agitation and feeling lonely.  He has tried multiple antidepressants in the past, but details are unknown.  Amongst them, he has tried Lexapro according to chart review.  He reports that he had better response with Valium in the past.  He currently lives alone and does not have much support from family or friends.  He has started smoking about a pack per day now.    Past Medical History:  Diagnosis Date   Anxiety    COPD (chronic obstructive pulmonary disease) ([place])    Dark stools 07/08/2019   Depression    Depression    Frequency of urination    GAD (generalized anxiety disorder)    GERD (gastroesophageal reflux disease)    Hepatitis C    s/p treatment with Dr. Paulita Fujita in 2017. HCV RNA not detected in April 2021   Hypercholesterolemia    Hypertension    PAD (peripheral artery disease)  ([place])    Scalp lesion 04/12/2019   Substance abuse ([place])    Transfusion of blood product refused for religious reason    Type II diabetes mellitus [facility])     Past Surgical History:  Procedure Laterality Date   ABDOMINAL AORTOGRAM W/LOWER EXTREMITY Bilateral 05/30/2017   ABDOMINAL AORTOGRAM W/LOWER EXTREMITY N/A 05/30/2017   Procedure: ABDOMINAL AORTOGRAM W/LOWER EXTREMITY;  Surgeon: Elam Dutch, MD;  Location: [facility];  Service: Cardiovascular;  Laterality: N/A;  Bilateral    Family History  Problem Relation Age of Onset   Alzheimer's disease Mother    Mental illness Mother        Depression   Breast cancer Mother    Cancer Father        bladder?   Hypertension Father    Alcohol abuse Father    Heart disease Sister    Hearing loss Sister        valve   Alcohol abuse Brother    Diabetes Paternal Aunt    COPD Sister    Alcohol abuse Brother    Alcohol abuse Brother    Alcohol abuse Brother    Early death Brother        MVA   Cancer Maternal Grandmother    Colon cancer Neg Hx    Liver cancer Neg Hx  Social History   Socioeconomic History   Marital status: Single    Spouse name: Not on file   Number of children: 0   Years of education: 11   Highest education level: Not on file  Occupational History   Occupation: retired    Comment: labor  Tobacco Use   Smoking status: Former    Packs/day: 1.00    Years: 57.00    Total pack years: 57.00    Types: Cigarettes    Start date: 07/01/1961    Quit date: 01/29/2021    Years since quitting: 1.2   Smokeless tobacco: Never   Tobacco comments:    Has tried to quit, but his depressed mood makes it challenging to quit  Vaping Use   Vaping Use: Never used  Substance and Sexual Activity   Alcohol use: Not Currently    Comment: history of heavy alcohol use; last drank alcohol about 3 years ago   Drug use: Not Currently    Types: Marijuana, Cocaine, Heroin, "Crack" cocaine    Comment: Last used heroin in  his 51s. Last used cocaine and marijuana about 3 years ago.    Sexual activity: Not Currently  Other Topics Concern   Not on file  Social History Narrative   Single.Live in boarding house   Renting a room   looking for apartment   Never had license   Likes to read   Social Determinants of [place] Strain: Low Risk  (07/05/2021)   Overall Financial Resource Strain (CARDIA)    Difficulty of Paying Living Expenses: Not hard at all  Food Insecurity: No Food Insecurity (07/05/2021)   Hunger Vital Sign    Worried About Running Out of Food in the Last Year: Never true    Ran Out of Food in the Last Year: Never true  Transportation Needs: No Transportation Needs (07/05/2021)   PRAPARE - Hydrologist (Medical): No    Lack of Transportation (Non-Medical): No  Physical Activity: Inactive (08/24/2021)   Exercise Vital Sign    Days of Exercise per Week: 0 days    Minutes of Exercise per Session: 0 min  Stress: Stress Concern Present (08/24/2021)   [place]    Feeling of Stress : To some extent  Social Connections: Moderately Isolated (07/05/2021)   Social Connection and Isolation Panel [NHANES]    Frequency of Communication with Friends and Family: More than three times a week    Frequency of Social Gatherings with Friends and Family: Never    Attends Religious Services: More than 4 times per year    Active Member of [employer] or Organizations: No    Attends Archivist Meetings: Never    Marital Status: Never married  Intimate Partner Violence: Not At Risk (07/05/2021)   Humiliation, Afraid, Rape, and Kick questionnaire    Fear of Current or Ex-Partner: No    Emotionally Abused: No    Physically Abused: No    Sexually Abused: No    Outpatient Medications Prior to Visit  Medication Sig Dispense Refill   amLODipine (NORVASC) 10 MG tablet Take 1 tablet (10 mg total) by mouth  daily. 90 tablet 1   aspirin 81 MG chewable tablet Chew 1 tablet by mouth daily.     atorvastatin (LIPITOR) 10 MG tablet TAKE 1 TABLET(10 MG) BY MOUTH DAILY 90 tablet 1   blood glucose meter kit and supplies Dispense based on  patient and insurance preference. Use up to four times daily as directed. (FOR ICD-10 E10.9, E11.9). 1 each 0   Carboxymethylcell-Glycerin PF (REFRESH OPTIVE PF) 0.5-0.9 % SOLN Place 2 drops into both eyes daily as needed (Irritation).     Cholecalciferol (VITAMIN D3) 25 MCG (1000 UT) CAPS Take 1 capsule (1,000 Units total) by mouth in the morning and at bedtime. 60 capsule 0   fexofenadine (ALLEGRA) 180 MG tablet Take 180 mg by mouth daily.     glipiZIDE (GLUCOTROL) 5 MG tablet TAKE 1 TABLET(5 MG) BY MOUTH TWICE DAILY BEFORE A MEAL 180 tablet 0   JARDIANCE 10 MG TABS tablet TAKE 1 TABLET(10 MG) BY MOUTH DAILY BEFORE BREAKFAST 30 tablet 2   losartan (COZAAR) 100 MG tablet TAKE 1 TABLET(100 MG) BY MOUTH DAILY 90 tablet 0   Multiple Vitamin (MULTIVITAMIN WITH MINERALS) TABS tablet Take 1 tablet by mouth daily. [facility]     Omega-3 Fatty Acids (FISH OIL PEARLS) 300 MG CAPS Take 600 mg by mouth 2 (two) times daily. (Patient taking differently: Take 1,000 mg by mouth at bedtime.) 60 capsule 3   oxymetazoline (NASAL SPRAY MOISTURIZING 12 HR) 0.05 % nasal spray Place 1 spray into both nostrils daily as needed for congestion.     vitamin C (ASCORBIC ACID) 500 MG tablet Take 500 mg by mouth at bedtime.     blood glucose meter kit and supplies KIT Dx   e11.9 1 each 0   metFORMIN (GLUCOPHAGE) 500 MG tablet Take 1 tablet (500 mg total) by mouth daily with breakfast. 180 tablet 1   No facility-administered medications prior to visit.    Allergies  Allergen Reactions   Onion     Bags under eyes, indigestion    Other Swelling    Hot sauce -- causes eye swelling Spicy food    ROS Review of Systems  Constitutional:  Negative for chills and fever.  HENT:  Negative for  congestion and sore throat.   Eyes:  Negative for pain and discharge.  Respiratory:  Negative for cough and shortness of breath.   Cardiovascular:  Negative for chest pain and palpitations.  Gastrointestinal:  Negative for diarrhea, nausea and vomiting.  Endocrine: Negative for polydipsia and polyuria.  Genitourinary:  Negative for dysuria and hematuria.  Musculoskeletal:  Positive for arthralgias, gait problem and myalgias. Negative for neck pain and neck stiffness.  Skin:  Negative for rash.  Neurological:  Negative for dizziness, weakness, numbness and headaches.  Psychiatric/Behavioral:  Positive for dysphoric mood and sleep disturbance. Negative for agitation, behavioral problems and suicidal ideas. The patient is nervous/anxious.       Objective:    Physical Exam Vitals reviewed.  Constitutional:      General: He is not in acute distress.    Appearance: He is not diaphoretic.  HENT:     Head: Normocephalic and atraumatic.     Nose: Nose normal.     Mouth/Throat:     Mouth: Mucous membranes are moist.  Eyes:     General: No scleral icterus.    Extraocular Movements: Extraocular movements intact.  Cardiovascular:     Rate and Rhythm: Normal rate and regular rhythm.     Heart sounds: Normal heart sounds. No murmur heard. Pulmonary:     Breath sounds: Normal breath sounds. No wheezing or rales.  Musculoskeletal:     Cervical back: Neck supple. No tenderness.     Right lower leg: No edema.     Left lower leg:  No edema.  Skin:    General: Skin is warm.     Findings: No rash.  Neurological:     General: No focal deficit present.     Mental Status: He is alert and oriented to person, place, and time. Mental status is at baseline.     Cranial Nerves: No cranial nerve deficit.     Sensory: No sensory deficit.     Motor: Weakness (4/5 in b/l LE) present.  Psychiatric:        Mood and Affect: Mood is depressed.        Behavior: Behavior normal.     BP 124/82 (BP  Location: Left Arm, Patient Position: Sitting, Cuff Size: Normal)   Pulse 71   Resp 18   Ht _0  (1.778 m)   Wt 233 lb (105.7 kg)   SpO2 97%   BMI 33.43 kg/m  Wt Readings from Last 3 Encounters:  04/26/22 233 lb (105.7 kg)  12/07/21 230 lb 3.2 oz (104.4 kg)  08/08/21 228 lb 1.9 oz (103.5 kg)    Lab Results  Component Value Date   TSH 2.660 08/08/2021   Lab Results  Component Value Date   WBC 10.4 08/08/2021   HGB 16.4 08/08/2021   HCT 49.5 08/08/2021   MCV 87 08/08/2021   PLT 267 08/08/2021   Lab Results  Component Value Date   NA 141 12/07/2021   K 4.7 12/07/2021   CO2 21 12/07/2021   GLUCOSE 119 (H) 12/07/2021   BUN 22 12/07/2021   CREATININE 1.39 (H) 12/07/2021   BILITOT 0.4 08/08/2021   ALKPHOS 154 (H) 08/08/2021   AST 22 08/08/2021   ALT 30 08/08/2021   PROT 7.5 08/08/2021   ALBUMIN 4.4 08/08/2021   CALCIUM 9.4 12/07/2021   ANIONGAP 11 11/16/2019   EGFR 53 (L) 12/07/2021   Lab Results  Component Value Date   CHOL 117 08/08/2021   Lab Results  Component Value Date   HDL 32 (L) 08/08/2021   Lab Results  Component Value Date   LDLCALC 69 08/08/2021   Lab Results  Component Value Date   TRIG 81 08/08/2021   Lab Results  Component Value Date   CHOLHDL 3.7 08/08/2021   Lab Results  Component Value Date   HGBA1C 6.4 (H) 12/07/2021      Assessment & Plan:   Problem List Items Addressed This Visit       Cardiovascular and Mediastinum   Essential hypertension    BP Readings from Last 1 Encounters:  04/26/22 124/82  Well-controlled with Losartan Counseled for compliance with the medications Advised DASH diet and moderate exercise/walking, at least 150 mins/week      PVD (peripheral vascular disease) ([place])    On aspirin and statin Has claudication symptoms Has weak pulse on DPA Offered vascular surgery referral, but he denies         Respiratory   Centrilobular emphysema ([place])    Noted on CT chest Has been trying to cut down  smoking Asymptomatic currently        Endocrine   Type 2 diabetes mellitus ([place]) - Primary    Lab Results  Component Value Date   HGBA1C 6.4 (H) 12/07/2021  Well-controlled On glipizide, metformin and Jardiance Advised to follow diabetic diet On statin and ARB F/u CMP and lipid panel Diabetic eye exam: Advised to follow up with Ophthalmology for diabetic eye exam      Relevant Medications   metFORMIN (GLUCOPHAGE) 500 MG tablet  Other Relevant Orders   Basic Metabolic Panel (BMET)   Hemoglobin A1c     Genitourinary   Stage 3a chronic kidney disease (HCC)    Last CMP showed GFR of 53, overall stable Check BMP  Checked urine microalbumin/creatinine ratio - On losartan and Jardiance Avoid nephrotoxic agents Advised to maintain adequate hydration -needs to cut down soft drinks intake       Relevant Orders   Basic Metabolic Panel (BMET)     Other   Tobacco abuse    Smokes about 1 pack/day now  Asked about quitting: confirms that he/she currently smokes cigarettes Advise to quit smoking: Educated about QUITTING to reduce the risk of cancer, cardio and cerebrovascular disease. Assess willingness: Unwilling to quit at this time, but is working on cutting back.  Started Wellbutrin for MDD and anxiety. Assist with counseling and pharmacotherapy: Counseled for 5 minutes and literature provided. Arrange for follow up: follow up in 3 months and continue to offer help.      HLD (hyperlipidemia)    On statin  Check lipid profile       Encounter for screening for lung cancer   Relevant Orders   CT CHEST LUNG CANCER SCREENING LOW DOSE WO CONTRAST   Depression    Lexapro was discontinued in the last visit as he was feeling better without it Prefers to take Valium Started Wellbutrin for MDD and anxiety If persistent anxiety, may add Valium Refer to [facility] therapy for now      Relevant Medications   buPROPion (WELLBUTRIN XL) 150 MG 24 hr tablet    Meds ordered this  encounter  Medications   buPROPion (WELLBUTRIN XL) 150 MG 24 hr tablet    Sig: Take 1 tablet (150 mg total) by mouth daily.    Dispense:  30 tablet    Refill:  3   metFORMIN (GLUCOPHAGE) 500 MG tablet    Sig: Take 1 tablet (500 mg total) by mouth daily with breakfast.    Dispense:  180 tablet    Refill:  1    Follow-up: Return in about 4 months (around 08/27/2022) for Annual physical.    Lindell Spar, MD

## 2022-04-26 NOTE — Patient Instructions (Signed)
Please start taking Bupropion as prescribed.  Please continue taking other medications as prescribed.  Please continue to follow low carb diet and ambulate as tolerated.  Please try to cut down -> quit smoking.

## 2022-04-27 LAB — BASIC METABOLIC PANEL
BUN/Creatinine Ratio: 12 (ref 10–24)
BUN: 16 mg/dL (ref 8–27)
CO2: 18 mmol/L — ABNORMAL LOW (ref 20–29)
Calcium: 9.3 mg/dL (ref 8.6–10.2)
Chloride: 106 mmol/L (ref 96–106)
Creatinine, Ser: 1.33 mg/dL — ABNORMAL HIGH (ref 0.76–1.27)
Glucose: 114 mg/dL — ABNORMAL HIGH (ref 70–99)
Potassium: 4.2 mmol/L (ref 3.5–5.2)
Sodium: 142 mmol/L (ref 134–144)
eGFR: 56 mL/min/{1.73_m2} — ABNORMAL LOW (ref >59)

## 2022-04-27 LAB — HEMOGLOBIN A1C
Est. average glucose Bld gHb Est-mCnc: 134 mg/dL
Hgb A1c MFr Bld: 6.3 % — ABNORMAL HIGH (ref 4.8–5.6)

## 2022-05-28 ENCOUNTER — Ambulatory Visit: Payer: Medicare HMO

## 2022-06-07 ENCOUNTER — Ambulatory Visit (HOSPITAL_COMMUNITY): Admission: RE | Admit: 2022-06-07 | Payer: Medicare HMO | Source: Ambulatory Visit

## 2022-07-03 ENCOUNTER — Other Ambulatory Visit: Payer: Self-pay

## 2022-07-03 ENCOUNTER — Other Ambulatory Visit: Payer: Self-pay | Admitting: Internal Medicine

## 2022-07-03 ENCOUNTER — Telehealth: Payer: Self-pay | Admitting: Internal Medicine

## 2022-07-03 DIAGNOSIS — I10 Essential (primary) hypertension: Secondary | ICD-10-CM

## 2022-07-03 MED ORDER — LOSARTAN POTASSIUM 100 MG PO TABS: ORAL_TABLET | ORAL | 0 refills | Status: DC

## 2022-07-03 MED ORDER — AMLODIPINE BESYLATE 10 MG PO TABS: 10.0000 mg | ORAL_TABLET | Freq: Every day | ORAL | 1 refills | Status: DC

## 2022-07-03 NOTE — Telephone Encounter (Signed)
Refills sent patient advised

## 2022-07-03 NOTE — Telephone Encounter (Signed)
Prescription Request  07/03/2022  Is this a "Controlled Substance" medicine? No  LOV: 04/26/2022  What is the name of the medication or equipment? losartan (COZAAR) 100 MG tablet [[id number]]    amLODipine (NORVASC) 10 MG tablet [[id number]]    Have you contacted your pharmacy to request a refill? Yes   Which pharmacy would you like this sent to?   [facility] [address] [place]   Patient notified that their request is being sent to the clinical staff for review and that they should receive a response within 2 business days.   Please advise at Mobile [phone] (mobile)

## 2022-07-06 ENCOUNTER — Other Ambulatory Visit: Payer: Self-pay | Admitting: Internal Medicine

## 2022-07-06 DIAGNOSIS — E1169 Type 2 diabetes mellitus with other specified complication: Secondary | ICD-10-CM

## 2022-07-08 ENCOUNTER — Telehealth: Payer: Self-pay | Admitting: Internal Medicine

## 2022-07-08 NOTE — Telephone Encounter (Signed)
Patient advised.

## 2022-07-08 NOTE — Telephone Encounter (Signed)
Pt med was refilled today but pt is unable to have them delivered till tomorrow. He is wanting to know will it hurt him to go without his medication today and until it arrives tomorrow?

## 2022-07-16 ENCOUNTER — Encounter: Payer: Medicare HMO | Admitting: Internal Medicine

## 2022-07-16 ENCOUNTER — Encounter: Payer: Medicare HMO | Admitting: Family Medicine

## 2022-07-24 ENCOUNTER — Ambulatory Visit (INDEPENDENT_AMBULATORY_CARE_PROVIDER_SITE_OTHER): Payer: Medicare HMO

## 2022-07-24 VITALS — BP 152/83 | HR 72 | Ht 70.0 in | Wt 228.0 lb

## 2022-07-24 DIAGNOSIS — Z0000 Encounter for general adult medical examination without abnormal findings: Secondary | ICD-10-CM | POA: Diagnosis not present

## 2022-07-24 NOTE — Progress Notes (Signed)
Subjective:   Alex Marks is a 77 y.o. male who presents for Medicare Annual/Subsequent preventive examination.  Review of Systems     Objective:    Today's Vitals   07/24/22 1034  BP: (!) 152/83  Pulse: 72  SpO2: 95%  Weight: 228 lb (103.4 kg)  Height: 5\' 10"  (1.778 m)   Body mass index is 32.71 kg/m.     07/24/2022   10:48 AM 07/05/2021   10:55 AM 11/16/2019    7:05 PM 06/07/2019   11:21 AM 05/30/2017    6:03 PM 05/30/2017    7:09 AM 05/14/2017    9:53 AM  Advanced Directives  Does Patient Have a Medical Advance Directive? No No No No No No No  Does patient want to make changes to medical advance directive? No - Patient declined        Would patient like information on creating a medical advance directive? No - Patient declined No - Patient declined  No - Patient declined No - Patient declined  No - Patient declined    Current Medications (verified) Outpatient Encounter Medications as of 07/24/2022  Medication Sig   amLODipine (NORVASC) 10 MG tablet Take 1 tablet (10 mg total) by mouth daily.   aspirin 81 MG chewable tablet Chew 1 tablet by mouth daily.   atorvastatin (LIPITOR) 10 MG tablet TAKE 1 TABLET(10 MG) BY MOUTH DAILY   blood glucose meter kit and supplies Dispense based on patient and insurance preference. Use up to four times daily as directed. (FOR ICD-10 E10.9, E11.9).   buPROPion (WELLBUTRIN XL) 150 MG 24 hr tablet Take 1 tablet (150 mg total) by mouth daily.   Carboxymethylcell-Glycerin PF (REFRESH OPTIVE PF) 0.5-0.9 % SOLN Place 2 drops into both eyes daily as needed (Irritation).   Cholecalciferol (VITAMIN D3) 25 MCG (1000 UT) CAPS Take 1 capsule (1,000 Units total) by mouth in the morning and at bedtime.   fexofenadine (ALLEGRA) 180 MG tablet Take 180 mg by mouth daily.   glipiZIDE (GLUCOTROL) 5 MG tablet TAKE 1 TABLET(5 MG) BY MOUTH TWICE DAILY BEFORE A MEAL   JARDIANCE 10 MG TABS tablet TAKE 1 TABLET(10 MG) BY MOUTH DAILY BEFORE BREAKFAST   losartan  (COZAAR) 100 MG tablet TAKE 1 TABLET(100 MG) BY MOUTH DAILY   metFORMIN (GLUCOPHAGE) 500 MG tablet Take 1 tablet (500 mg total) by mouth daily with breakfast.   Multiple Vitamin (MULTIVITAMIN WITH MINERALS) TABS tablet Take 1 tablet by mouth daily. Senior Centrum   Omega-3 Fatty Acids (FISH OIL PEARLS) 300 MG CAPS Take 600 mg by mouth 2 (two) times daily. (Patient taking differently: Take 1,000 mg by mouth at bedtime.)   oxymetazoline (NASAL SPRAY MOISTURIZING 12 HR) 0.05 % nasal spray Place 1 spray into both nostrils daily as needed for congestion.   SPIKEVAX COVID-19 VACCINE 100 MCG/0.5ML injection Inject into the muscle.   vitamin C (ASCORBIC ACID) 500 MG tablet Take 500 mg by mouth at bedtime.   No facility-administered encounter medications on file as of 07/24/2022.    Allergies (verified) Onion and Other   History: Past Medical History:  Diagnosis Date   Anxiety    COPD (chronic obstructive pulmonary disease) (HCC)    Dark stools 07/08/2019   Depression    Depression    Frequency of urination    GAD (generalized anxiety disorder)    GERD (gastroesophageal reflux disease)    Hepatitis C    s/p treatment with Dr. 09/05/2019 in 2017. HCV RNA not detected  in April 2021   Hypercholesterolemia    Hypertension    PAD (peripheral artery disease) (HCC)    Scalp lesion 04/12/2019   Substance abuse (HCC)    Transfusion of blood product refused for religious reason    Type II diabetes mellitus [facility])    Past Surgical History:  Procedure Laterality Date   ABDOMINAL AORTOGRAM W/LOWER EXTREMITY Bilateral 05/30/2017   ABDOMINAL AORTOGRAM W/LOWER EXTREMITY N/A 05/30/2017   Procedure: ABDOMINAL AORTOGRAM W/LOWER EXTREMITY;  Surgeon: Sherren Kerns, MD;  Location: [facility] INVASIVE CV LAB;  Service: Cardiovascular;  Laterality: N/A;  Bilateral   Family History  Problem Relation Age of Onset   Alzheimer's disease Mother    Mental illness Mother        Depression   Breast cancer Mother    Cancer  Father        bladder?   Hypertension Father    Alcohol abuse Father    Heart disease Sister    Hearing loss Sister        valve   Alcohol abuse Brother    Diabetes Paternal Aunt    COPD Sister    Alcohol abuse Brother    Alcohol abuse Brother    Alcohol abuse Brother    Early death Brother        MVA   Cancer Maternal Grandmother    Colon cancer Neg Hx    Liver cancer Neg Hx    Social History   Socioeconomic History   Marital status: Single    Spouse name: Not on file   Number of children: 0   Years of education: 11   Highest education level: Not on file  Occupational History   Occupation: retired    Comment: labor  Tobacco Use   Smoking status: Former    Packs/day: 1.00    Years: 57.00    Total pack years: 57.00    Types: Cigarettes    Start date: 07/01/1961    Quit date: 01/29/2021    Years since quitting: 1.4   Smokeless tobacco: Never   Tobacco comments:    Has tried to quit, but his depressed mood makes it challenging to quit  Vaping Use   Vaping Use: Never used  Substance and Sexual Activity   Alcohol use: Not Currently    Comment: history of heavy alcohol use; last drank alcohol about 3 years ago   Drug use: Not Currently    Types: Marijuana, Cocaine, Heroin, "Crack" cocaine    Comment: Last used heroin in his 45s. Last used cocaine and marijuana about 3 years ago.    Sexual activity: Not Currently  Other Topics Concern   Not on file  Social History Narrative   Single.Live in apartment home   Never had license   Likes to read   Social Determinants of Health   Financial Resource Strain: Low Risk  (07/24/2022)   Overall Financial Resource Strain (CARDIA)    Difficulty of Paying Living Expenses: Not hard at all  Food Insecurity: No Food Insecurity (07/24/2022)   Hunger Vital Sign    Worried About Running Out of Food in the Last Year: Never true    Ran Out of Food in the Last Year: Never true  Transportation Needs: Unmet Transportation Needs  (07/24/2022)   PRAPARE - Transportation    Lack of Transportation (Medical): Yes    Lack of Transportation (Non-Medical): Yes  Physical Activity: Inactive (07/24/2022)   Exercise Vital Sign    Days of Exercise per  Week: 0 days    Minutes of Exercise per Session: 0 min  Stress: No Stress Concern Present (07/24/2022)   [employer] of Occupational Health - Occupational Stress Questionnaire    Feeling of Stress : Not at all  Social Connections: Moderately Isolated (07/24/2022)   Social Connection and Isolation Panel [NHANES]    Frequency of Communication with Friends and Family: Never    Frequency of Social Gatherings with Friends and Family: Never    Attends Religious Services: More than 4 times per year    Active Member of Clubs or Organizations: Yes    Attends Engineer, structural: More than 4 times per year    Marital Status: Never married    Tobacco Counseling Counseling given: Not Answered Tobacco comments: Has tried to quit, but his depressed mood makes it challenging to quit   Clinical Intake:  Diabetic? Yes    Activities of Daily Living    07/24/2022   10:49 AM  In your present state of health, do you have any difficulty performing the following activities:  Hearing? 0  Vision? 1  Difficulty concentrating or making decisions? 1  Walking or climbing stairs? 1  Dressing or bathing? 0  Doing errands, shopping? 0  Preparing Food and eating ? N  Using the Toilet? N  In the past six months, have you accidently leaked urine? N  Do you have problems with loss of bowel control? N  Managing your Medications? N  Managing your Finances? N  Housekeeping or managing your Housekeeping? Y    Patient Care Team: Anabel Halon, MD as PCP - General (Internal Medicine) Jena Gauss Gerrit Friends, MD as Consulting Physician (Gastroenterology)  Indicate any recent Medical Services you may have received from other than [facility] providers in the past year (date may be  approximate).     Assessment:   This is a routine wellness examination for Kyuss.  Hearing/Vision screen No results found.  Dietary issues and exercise activities discussed:     Goals Addressed   None   Depression Screen    07/24/2022   10:46 AM 07/24/2022   10:44 AM 04/26/2022    8:37 AM 12/07/2021    9:56 AM 08/24/2021    9:42 AM 08/08/2021   10:17 AM 07/05/2021   10:51 AM  PHQ 2/9 Scores  PHQ - 2 Score 6 6 3  0 5 0 5  PHQ- 9 Score 12 12 3  0 10 0 5    Fall Risk    07/24/2022   10:48 AM 04/26/2022    8:37 AM 12/07/2021    9:56 AM 08/08/2021   10:16 AM 07/05/2021   10:55 AM  Fall Risk   Falls in the past year? 0 0 0 0 0  Number falls in past yr: 0 0 0 0 0  Injury with Fall? 0 0 0 0 0  Risk for fall due to : No Fall Risks No Fall Risks No Fall Risks No Fall Risks   Follow up Falls evaluation completed Falls evaluation completed Falls evaluation completed Falls evaluation completed Falls evaluation completed    FALL RISK PREVENTION PERTAINING TO THE HOME:  Any stairs in or around the home? No  If so, are there any without handrails?  NA Home free of loose throw rugs in walkways, pet beds, electrical cords, etc? No  Adequate lighting in your home to reduce risk of falls? Yes   ASSISTIVE DEVICES UTILIZED TO PREVENT FALLS:  Life alert? No  Use of a  cane, walker or w/c? Yes  Grab bars in the bathroom? Yes  Shower chair or bench in shower? Yes  Elevated toilet seat or a handicapped toilet? No   TIMED UP AND GO:  Was the test performed? Yes .  Length of time to ambulate 10 feet: 5 sec.   Gait steady and fast with assistive device  Cognitive Function:        07/24/2022   10:49 AM 07/05/2021   10:58 AM  6CIT Screen  What Year? 0 points 0 points  What month? 0 points 0 points  What time? 0 points 0 points  Count back from 20 0 points 0 points  Months in reverse 0 points 4 points  Repeat phrase 0 points 6 points  Total Score 0 points 10 points     Immunizations Immunization History  Administered Date(s) Administered   DTaP 09/01/2015   Fluad Quad(high Dose 65+) 04/12/2020, 04/16/2021, 03/15/2022   Hep A / Hep B 02/21/2016, 04/25/2016   Influenza Inj Mdck Quad Pf 04/12/2019   Influenza-Unspecified 02/21/2016, 04/14/2017   MODERNA COVID-19 SARS-COV-2 PEDS BIVALENT BOOSTER 6Y-11Y 11/08/2021   PFIZER Comirnaty(Gray Top)Covid-19 Tri-Sucrose Vaccine 04/24/2021   PFIZER(Purple Top)SARS-COV-2 Vaccination 08/05/2019, 08/30/2019   Pneumococcal Conjugate-13 05/17/2019   Pneumococcal Polysaccharide-23 08/23/2015   Tdap 05/17/2019   Zoster Recombinat (Shingrix) 12/13/2021, 03/15/2022    TDAP status: Up to date  Flu Vaccine status: Up to date  Pneumococcal vaccine status: Up to date  Covid-19 vaccine status: Completed vaccines  Qualifies for Shingles Vaccine? No   Zostavax completed No   Shingrix Completed?: Yes  Screening Tests Health Maintenance  Topic Date Due   Fecal DNA (Cologuard)  Never done   OPHTHALMOLOGY EXAM  07/06/2020   Lung Cancer Screening  07/25/2020   COVID-19 Vaccine (5 - 2023-24 season) 03/01/2022   HEMOGLOBIN A1C  10/26/2022   Diabetic kidney evaluation - Urine ACR  12/08/2022   Diabetic kidney evaluation - eGFR measurement  04/27/2023   FOOT EXAM  04/27/2023   Medicare Annual Wellness (AWV)  07/25/2023   DTaP/Tdap/Td (3 - Td or Tdap) 05/16/2029   Pneumonia Vaccine 75+ Years old  Completed   INFLUENZA VACCINE  Completed   Hepatitis C Screening  Completed   Zoster Vaccines- Shingrix  Completed   HPV VACCINES  Aged Out    Health Maintenance  Health Maintenance Due  Topic Date Due   Fecal DNA (Cologuard)  Never done   OPHTHALMOLOGY EXAM  07/06/2020   Lung Cancer Screening  07/25/2020   COVID-19 Vaccine (5 - 2023-24 season) 03/01/2022    Colorectal cancer screening: No longer required.  Patient declined  Lung Cancer Screening: (Low Dose CT Chest recommended if Age 57-80 years, 30 pack-year  currently smoking OR have quit w/in 15years.) does qualify.   Lung Cancer Screening Referral: sent  Additional Screening:  Hepatitis C Screening: does not qualify; Completed 2021  Vision Screening: Recommended annual ophthalmology exams for early detection of glaucoma and other disorders of the eye. Is the patient up to date with their annual eye exam?  No  Who is the provider or what is the name of the office in which the patient attends annual eye exams? NA If pt is not established with a provider, would they like to be referred to a provider to establish care? No .   Dental Screening: Recommended annual dental exams for proper oral hygiene  Community Resource Referral / Chronic Care Management: CRR required this visit?  No   CCM required  this visit?  No      Plan:     I have personally reviewed and noted the following in the patient's chart:   Medical and social history Use of alcohol, tobacco or illicit drugs  Current medications and supplements including opioid prescriptions. Patient is not currently taking opioid prescriptions. Functional ability and status Nutritional status Physical activity Advanced directives List of other physicians Hospitalizations, surgeries, and ER visits in previous 12 months Vitals Screenings to include cognitive, depression, and falls Referrals and appointments  In addition, I have reviewed and discussed with patient certain preventive protocols, quality metrics, and best practice recommendations. A written personalized care plan for preventive services as well as general preventive health recommendations were provided to patient.     Smitty Knudsen, CMA   07/24/2022

## 2022-07-24 NOTE — Patient Instructions (Signed)
  Alex Marks , Thank you for taking time to come for your Medicare Wellness Visit. I appreciate your ongoing commitment to your health goals. Please review the following plan we discussed and let me know if I can assist you in the future.   These are the goals we discussed:  Goals       Blood Pressure < 140/90      Depressive Symptoms Identified. Manage Depression issues (pt-stated)      Time Frame:  Short Term Goal Priority:  Medium Progress: Not On Track  Start Date:  08/24/21 End Date: 11/16/21  Follow Up Date:  09/21/21 at 2:00 PM  Depressive Symptoms Identified. Manage Depression issues  Patient Self Care Activities:  Takes medications as prescribed  Attends medical appointments Gets adequate rest  Patient Coping Strengths:  Alex Marks, Alex Marks is supportive  Patient Self Care Deficits:  Depression issues Some walking challenges  Patient Goals:  - spend time or talk with others at least 2 to 3 times per week - practice relaxation or meditation daily - keep a calendar with appointment dates  Follow Up Plan: LCSW to call client on 09/21/21 at 2:00 PM        HEMOGLOBIN A1C < 7.0      LDL CALC < 100      Quit smoking / using tobacco        This is a list of the screening recommended for you and due dates:  Health Maintenance  Topic Date Due   Cologuard (Stool DNA test)  Never done   Eye exam for diabetics  07/06/2020   Screening for Lung Cancer  07/25/2020   COVID-19 Vaccine (5 - 2023-24 season) 03/01/2022   Hemoglobin A1C  10/26/2022   Yearly kidney health urinalysis for diabetes  12/08/2022   Yearly kidney function blood test for diabetes  04/27/2023   Complete foot exam   04/27/2023   Medicare Annual Wellness Visit  07/25/2023   DTaP/Tdap/Td vaccine (3 - Td or Tdap) 05/16/2029   Pneumonia Vaccine  Completed   Flu Shot  Completed   Hepatitis C Screening: USPSTF Recommendation to screen - Ages 40-79 yo.  Completed   Zoster (Shingles) Vaccine  Completed    HPV Vaccine  Aged Out

## 2022-07-30 ENCOUNTER — Ambulatory Visit: Payer: Medicare HMO | Admitting: Internal Medicine

## 2022-08-06 ENCOUNTER — Ambulatory Visit: Payer: Medicare HMO | Admitting: Internal Medicine

## 2022-09-02 ENCOUNTER — Encounter: Payer: Self-pay | Admitting: Internal Medicine

## 2022-09-02 ENCOUNTER — Ambulatory Visit (INDEPENDENT_AMBULATORY_CARE_PROVIDER_SITE_OTHER): Payer: Medicare HMO | Admitting: Internal Medicine

## 2022-09-02 VITALS — BP 144/82 | HR 66 | Ht 70.0 in | Wt 228.6 lb

## 2022-09-02 DIAGNOSIS — N1831 Chronic kidney disease, stage 3a: Secondary | ICD-10-CM

## 2022-09-02 DIAGNOSIS — E1159 Type 2 diabetes mellitus with other circulatory complications: Secondary | ICD-10-CM | POA: Diagnosis not present

## 2022-09-02 DIAGNOSIS — I739 Peripheral vascular disease, unspecified: Secondary | ICD-10-CM

## 2022-09-02 DIAGNOSIS — Z125 Encounter for screening for malignant neoplasm of prostate: Secondary | ICD-10-CM | POA: Diagnosis not present

## 2022-09-02 DIAGNOSIS — Z0001 Encounter for general adult medical examination with abnormal findings: Secondary | ICD-10-CM

## 2022-09-02 DIAGNOSIS — E559 Vitamin D deficiency, unspecified: Secondary | ICD-10-CM | POA: Diagnosis not present

## 2022-09-02 DIAGNOSIS — I700 Atherosclerosis of aorta: Secondary | ICD-10-CM

## 2022-09-02 DIAGNOSIS — J432 Centrilobular emphysema: Secondary | ICD-10-CM | POA: Diagnosis not present

## 2022-09-02 DIAGNOSIS — I10 Essential (primary) hypertension: Secondary | ICD-10-CM | POA: Diagnosis not present

## 2022-09-02 DIAGNOSIS — E782 Mixed hyperlipidemia: Secondary | ICD-10-CM | POA: Diagnosis not present

## 2022-09-02 DIAGNOSIS — F331 Major depressive disorder, recurrent, moderate: Secondary | ICD-10-CM

## 2022-09-02 NOTE — Assessment & Plan Note (Signed)

## 2022-09-02 NOTE — Assessment & Plan Note (Signed)
On statin Check lipid profile 

## 2022-09-02 NOTE — Progress Notes (Signed)
Established Patient Office Visit  Subjective:  Patient ID: Alex Marks, male    DOB: April 02, 1946  Age: 77 y.o. MRN: [id number]  CC:  Chief Complaint  Patient presents with   Annual Exam    HPI Alex Marks is a 77 y.o. male with past medical history of HTN, PVD, hep C, type II DM, HLD and depression who presents for annual physical.  He complains of bilateral leg weakness and pain upon walking.  He also has gait disturbance.  Denies any numbness or tingling currently.  He has weak DPA pulse.  He denied vascular surgery referral in the past, but agrees for it now.  HTN: BP is elevated today, but he has not had his medicine yet. Takes medications regularly. Patient denies headache, dizziness, chest pain, dyspnea or palpitations.  Type II DM: He takes metformin, glipizide and Jardiance for it.  His last Hgb A1c was 6.4. He does not check blood glucose at home. He denies any fatigue, polyuria or polydipsia.    CKD: Last CMP showed GFR of 53.  Denies any dysuria, hematuria, urinary hesitance or resistance.  MDD: He reports anhedonia, anxiety/agitation and feeling lonely.  He has tried multiple antidepressants in the past, but details are unknown.  Amongst them, he has tried Lexapro according to chart review.  He reports that he had better response with Valium in the past.  He currently lives alone and does not have much support from family or friends.  He has started smoking about a pack per day now.    Past Medical History:  Diagnosis Date   Anxiety    COPD (chronic obstructive pulmonary disease) ([place])    Dark stools 07/08/2019   Depression    Depression    Frequency of urination    GAD (generalized anxiety disorder)    GERD (gastroesophageal reflux disease)    Hepatitis C    s/p treatment with Dr. Paulita Fujita in 2017. HCV RNA not detected in April 2021   Hypercholesterolemia    Hypertension    PAD (peripheral artery disease) ([place])    Scalp lesion 04/12/2019   Substance abuse  ([place])    Transfusion of blood product refused for religious reason    Type II diabetes mellitus [facility]     Past Surgical History:  Procedure Laterality Date   ABDOMINAL AORTOGRAM W/LOWER EXTREMITY Bilateral 05/30/2017   ABDOMINAL AORTOGRAM W/LOWER EXTREMITY N/A 05/30/2017   Procedure: ABDOMINAL AORTOGRAM W/LOWER EXTREMITY;  Surgeon: Elam Dutch, MD;  Location: [place] [facility];  Service: Cardiovascular;  Laterality: N/A;  Bilateral    Family History  Problem Relation Age of Onset   Alzheimer's disease Mother    Mental illness Mother        Depression   Breast cancer Mother    Cancer Father        bladder?   Hypertension Father    Alcohol abuse Father    Heart disease Sister    Hearing loss Sister        valve   Alcohol abuse Brother    Diabetes Paternal Aunt    COPD Sister    Alcohol abuse Brother    Alcohol abuse Brother    Alcohol abuse Brother    Early death Brother        MVA   Cancer Maternal Grandmother    Colon cancer Neg Hx    Liver cancer Neg Hx     Social History   Socioeconomic History   Marital status:  Single    Spouse name: Not on file   Number of children: 0   Years of education: 60   Highest education level: Not on file  Occupational History   Occupation: retired    Comment: labor  Tobacco Use   Smoking status: Former    Packs/day: 1.00    Years: 57.00    Total pack years: 57.00    Types: Cigarettes    Start date: 07/01/1961    Quit date: 01/29/2021    Years since quitting: 1.6   Smokeless tobacco: Never   Tobacco comments:    Has tried to quit, but his depressed mood makes it challenging to quit  Vaping Use   Vaping Use: Never used  Substance and Sexual Activity   Alcohol use: Not Currently    Comment: history of heavy alcohol use; last drank alcohol about 3 years ago   Drug use: Not Currently    Types: Marijuana, Cocaine, Heroin, "Crack" cocaine    Comment: Last used heroin in his 10s. Last used cocaine and marijuana about 3  years ago.    Sexual activity: Not Currently  Other Topics Concern   Not on file  Social History Narrative   Single.Live in apartment home   Never had license   Likes to read   Social Determinants of Health   Financial Resource Strain: Low Risk  (07/24/2022)   Overall Financial Resource Strain (CARDIA)    Difficulty of Paying Living Expenses: Not hard at all  Food Insecurity: No Food Insecurity (07/24/2022)   Hunger Vital Sign    Worried About Running Out of Food in the Last Year: Never true    Ran Out of Food in the Last Year: Never true  Transportation Needs: Unmet Transportation Needs (07/24/2022)   PRAPARE - Hydrologist (Medical): Yes    Lack of Transportation (Non-Medical): Yes  Physical Activity: Inactive (07/24/2022)   Exercise Vital Sign    Days of Exercise per Week: 0 days    Minutes of Exercise per Session: 0 min  Stress: No Stress Concern Present (07/24/2022)   [place]    Feeling of Stress : Not at all  Social Connections: Moderately Isolated (07/24/2022)   Social Connection and Isolation Panel [NHANES]    Frequency of Communication with Friends and Family: Never    Frequency of Social Gatherings with Friends and Family: Never    Attends Religious Services: More than 4 times per year    Active Member of [employer] or Organizations: Yes    Attends Music therapist: More than 4 times per year    Marital Status: Never married  Intimate Partner Violence: Not At Risk (07/24/2022)   Humiliation, Afraid, Rape, and Kick questionnaire    Fear of Current or Ex-Partner: No    Emotionally Abused: No    Physically Abused: No    Sexually Abused: No    Outpatient Medications Prior to Visit  Medication Sig Dispense Refill   amLODipine (NORVASC) 10 MG tablet Take 1 tablet (10 mg total) by mouth daily. 90 tablet 1   aspirin 81 MG chewable tablet Chew 1 tablet by mouth daily.      atorvastatin (LIPITOR) 10 MG tablet TAKE 1 TABLET(10 MG) BY MOUTH DAILY 90 tablet 1   blood glucose meter kit and supplies Dispense based on patient and insurance preference. Use up to four times daily as directed. (FOR ICD-10 E10.9, E11.9). 1 each 0  buPROPion (WELLBUTRIN XL) 150 MG 24 hr tablet Take 1 tablet (150 mg total) by mouth daily. 30 tablet 3   Carboxymethylcell-Glycerin PF (REFRESH OPTIVE PF) 0.5-0.9 % SOLN Place 2 drops into both eyes daily as needed (Irritation).     Cholecalciferol (VITAMIN D3) 25 MCG (1000 UT) CAPS Take 1 capsule (1,000 Units total) by mouth in the morning and at bedtime. 60 capsule 0   fexofenadine (ALLEGRA) 180 MG tablet Take 180 mg by mouth daily.     glipiZIDE (GLUCOTROL) 5 MG tablet TAKE 1 TABLET(5 MG) BY MOUTH TWICE DAILY BEFORE A MEAL 180 tablet 0   JARDIANCE 10 MG TABS tablet TAKE 1 TABLET(10 MG) BY MOUTH DAILY BEFORE BREAKFAST 30 tablet 2   losartan (COZAAR) 100 MG tablet TAKE 1 TABLET(100 MG) BY MOUTH DAILY 90 tablet 0   metFORMIN (GLUCOPHAGE) 500 MG tablet Take 1 tablet (500 mg total) by mouth daily with breakfast. 180 tablet 1   Multiple Vitamin (MULTIVITAMIN WITH MINERALS) TABS tablet Take 1 tablet by mouth daily. [facility]rum     Omega-3 Fatty Acids (FISH OIL PEARLS) 300 MG CAPS Take 600 mg by mouth 2 (two) times daily. (Patient taking differently: Take 1,000 mg by mouth at bedtime.) 60 capsule 3   oxymetazoline (NASAL SPRAY MOISTURIZING 12 HR) 0.05 % nasal spray Place 1 spray into both nostrils daily as needed for congestion.     vitamin C (ASCORBIC ACID) 500 MG tablet Take 500 mg by mouth at bedtime.     SPIKEVAX COVID-19 VACCINE 100 MCG/0.5ML injection Inject into the muscle.     No facility-administered medications prior to visit.    Allergies  Allergen Reactions   Onion     Bags under eyes, indigestion    Other Swelling    Hot sauce -- causes eye swelling Spicy food    ROS Review of Systems  Constitutional:  Negative for chills  and fever.  HENT:  Negative for congestion and sore throat.   Eyes:  Negative for pain and discharge.  Respiratory:  Negative for cough and shortness of breath.   Cardiovascular:  Negative for chest pain and palpitations.  Gastrointestinal:  Negative for diarrhea, nausea and vomiting.  Endocrine: Negative for polydipsia and polyuria.  Genitourinary:  Negative for dysuria and hematuria.  Musculoskeletal:  Positive for arthralgias, gait problem and myalgias. Negative for neck pain and neck stiffness.  Skin:  Negative for rash.  Neurological:  Negative for dizziness, weakness, numbness and headaches.  Psychiatric/Behavioral:  Positive for dysphoric mood and sleep disturbance. Negative for agitation, behavioral problems and suicidal ideas. The patient is nervous/anxious.       Objective:    Physical Exam Vitals reviewed.  Constitutional:      General: He is not in acute distress.    Appearance: He is not diaphoretic.  HENT:     Head: Normocephalic and atraumatic.     Nose: Nose normal.     Mouth/Throat:     Mouth: Mucous membranes are moist.  Eyes:     General: No scleral icterus.    Extraocular Movements: Extraocular movements intact.  Cardiovascular:     Rate and Rhythm: Normal rate and regular rhythm.     Heart sounds: Normal heart sounds. No murmur heard. Pulmonary:     Breath sounds: Normal breath sounds. No wheezing or rales.  Abdominal:     Palpations: Abdomen is soft.     Tenderness: There is no abdominal tenderness.  Musculoskeletal:     Cervical back: Neck  supple. No tenderness.     Right lower leg: No edema.     Left lower leg: No edema.  Skin:    General: Skin is warm.     Findings: No rash.  Neurological:     General: No focal deficit present.     Mental Status: He is alert and oriented to person, place, and time. Mental status is at baseline.     Cranial Nerves: No cranial nerve deficit.     Sensory: No sensory deficit.     Motor: Weakness (4/5 in b/l LE)  present.  Psychiatric:        Mood and Affect: Mood is depressed.        Behavior: Behavior normal.     BP (!) 144/82 (BP Location: Right Arm, Cuff Size: Normal)   Pulse 66   Ht 5\' 10"  (1.778 m)   Wt 228 lb 9.6 oz (103.7 kg)   SpO2 93%   BMI 32.80 kg/m  Wt Readings from Last 3 Encounters:  09/02/22 228 lb 9.6 oz (103.7 kg)  07/24/22 228 lb (103.4 kg)  04/26/22 233 lb (105.7 kg)    Lab Results  Component Value Date   TSH 2.630 09/02/2022   Lab Results  Component Value Date   WBC 11.8 (H) 09/02/2022   HGB 16.0 09/02/2022   HCT 47.4 09/02/2022   MCV 88 09/02/2022   PLT 254 09/02/2022   Lab Results  Component Value Date   NA 140 09/02/2022   K 4.0 09/02/2022   CO2 18 (L) 09/02/2022   GLUCOSE 68 (L) 09/02/2022   BUN 15 09/02/2022   CREATININE 1.19 09/02/2022   BILITOT 0.5 09/02/2022   ALKPHOS 134 (H) 09/02/2022   AST 26 09/02/2022   ALT 36 09/02/2022   PROT 7.2 09/02/2022   ALBUMIN 4.5 09/02/2022   CALCIUM 9.7 09/02/2022   ANIONGAP 11 11/16/2019   EGFR 64 09/02/2022   Lab Results  Component Value Date   CHOL 124 09/02/2022   Lab Results  Component Value Date   HDL 35 (L) 09/02/2022   Lab Results  Component Value Date   LDLCALC 72 09/02/2022   Lab Results  Component Value Date   TRIG 90 09/02/2022   Lab Results  Component Value Date   CHOLHDL 3.5 09/02/2022   Lab Results  Component Value Date   HGBA1C 6.2 (H) 09/02/2022      Assessment & Plan:   Problem List Items Addressed This Visit       Cardiovascular and Mediastinum   Essential hypertension    BP Readings from Last 1 Encounters:  09/02/22 (!) 144/82  Elevated today as he has not had his medicines today Usually well-controlled with Losartan Counseled for compliance with the medications Advised DASH diet and moderate exercise/walking, at least 150 mins/week      Relevant Orders   TSH (Completed)   PVD (peripheral vascular disease) ([place])    On aspirin and statin Has  claudication symptoms Has weak pulse on DPA Vascular surgery referral provided      Relevant Orders   Ambulatory referral to Vascular Surgery   Aortic atherosclerosis ([place])    Noted on CT chest On aspirin and statin      Relevant Orders   Lipid panel (Completed)     Respiratory   Centrilobular emphysema ([place])    Noted on CT chest Has been trying to cut down smoking Asymptomatic currently        Endocrine   Type 2 diabetes  mellitus ([place])    Lab Results  Component Value Date   HGBA1C 6.2 (H) 09/02/2022  Well-controlled On glipizide, metformin and Jardiance Advised to follow diabetic diet On statin and ARB F/u CMP and lipid panel Diabetic eye exam: Advised to follow up with Ophthalmology for diabetic eye exam      Relevant Orders   Hemoglobin A1c (Completed)   CMP14+EGFR (Completed)     Genitourinary   Stage 3a chronic kidney disease ([place])    Last CMP showed GFR of 56, overall stable Check BMP  Checked urine microalbumin/creatinine ratio - On losartan and Jardiance Avoid nephrotoxic agents Advised to maintain adequate hydration -needs to cut down soft drinks intake      Relevant Orders   CMP14+EGFR (Completed)   CBC with Differential/Platelet (Completed)     Other   HLD (hyperlipidemia)    On statin  Check lipid profile      Relevant Orders   Lipid panel (Completed)   Depression    Lexapro was discontinued in the last visit as he was feeling better without it Prefers to take Valium Started Wellbutrin for MDD and anxiety, but did not try If persistent anxiety, may add Valium - explained that Valium would not help anxiety      Encounter for general adult medical examination with abnormal findings - Primary    Physical exam as documented. Counseling done  re healthy lifestyle involving commitment to 150 minutes exercise per week, heart healthy diet, and attaining healthy weight.The importance of adequate sleep also discussed. Changes in health habits  are decided on by the patient with goals and time frames  set for achieving them. Immunization and cancer screening needs are specifically addressed at this visit.      Prostate cancer screening    Ordered PSA after discussing its limitations for prostate cancer screening, including false positive results leading to additional investigations.      Relevant Orders   PSA (Completed)   Other Visit Diagnoses     Vitamin D deficiency       Relevant Orders   VITAMIN D 25 Hydroxy (Vit-D Deficiency, Fractures) (Completed)      No orders of the defined types were placed in this encounter.   Follow-up: Return in about 6 weeks (around 10/14/2022) for HTN.    Lindell Spar, MD

## 2022-09-02 NOTE — Assessment & Plan Note (Signed)
Noted on CT chest Has been trying to cut down smoking Asymptomatic currently

## 2022-09-02 NOTE — Assessment & Plan Note (Addendum)
Last CMP showed GFR of 56, overall stable Check BMP  Checked urine microalbumin/creatinine ratio - On losartan and Jardiance Avoid nephrotoxic agents Advised to maintain adequate hydration -needs to cut down soft drinks intake

## 2022-09-02 NOTE — Assessment & Plan Note (Signed)
Ordered PSA after discussing its limitations for prostate cancer screening, including false positive results leading to additional investigations.

## 2022-09-02 NOTE — Assessment & Plan Note (Addendum)
BP Readings from Last 1 Encounters:  09/02/22 (!) 144/82   Elevated today as he has not had his medicines today Usually well-controlled with Losartan Counseled for compliance with the medications Advised DASH diet and moderate exercise/walking, at least 150 mins/week

## 2022-09-02 NOTE — Patient Instructions (Signed)
Please continue to take medications as prescribed. ? ?Please continue to follow low carb diet and perform moderate exercise/walking at least 150 mins/week. ?

## 2022-09-02 NOTE — Assessment & Plan Note (Signed)
Noted on CT chest On aspirin and statin

## 2022-09-03 LAB — LIPID PANEL
Chol/HDL Ratio: 3.5 ratio (ref 0.0–5.0)
Cholesterol, Total: 124 mg/dL (ref 100–199)
HDL: 35 mg/dL — ABNORMAL LOW (ref >39)
LDL Chol Calc (NIH): 72 mg/dL (ref 0–99)
Triglycerides: 90 mg/dL (ref 0–149)
VLDL Cholesterol Cal: 17 mg/dL (ref 5–40)

## 2022-09-03 LAB — PSA: Prostate Specific Ag, Serum: 2.5 ng/mL (ref 0.0–4.0)

## 2022-09-03 LAB — CMP14+EGFR
ALT: 36 IU/L (ref 0–44)
AST: 26 IU/L (ref 0–40)
Albumin/Globulin Ratio: 1.7 (ref 1.2–2.2)
Albumin: 4.5 g/dL (ref 3.8–4.8)
Alkaline Phosphatase: 134 IU/L — ABNORMAL HIGH (ref 44–121)
BUN/Creatinine Ratio: 13 (ref 10–24)
BUN: 15 mg/dL (ref 8–27)
Bilirubin Total: 0.5 mg/dL (ref 0.0–1.2)
CO2: 18 mmol/L — ABNORMAL LOW (ref 20–29)
Calcium: 9.7 mg/dL (ref 8.6–10.2)
Chloride: 105 mmol/L (ref 96–106)
Creatinine, Ser: 1.19 mg/dL (ref 0.76–1.27)
Globulin, Total: 2.7 g/dL (ref 1.5–4.5)
Glucose: 68 mg/dL — ABNORMAL LOW (ref 70–99)
Potassium: 4 mmol/L (ref 3.5–5.2)
Sodium: 140 mmol/L (ref 134–144)
Total Protein: 7.2 g/dL (ref 6.0–8.5)
eGFR: 64 mL/min/{1.73_m2} (ref >59)

## 2022-09-03 LAB — CBC WITH DIFFERENTIAL/PLATELET
Basophils Absolute: 0.1 10*3/uL (ref 0.0–0.2)
Basos: 1 %
EOS (ABSOLUTE): 0.3 10*3/uL (ref 0.0–0.4)
Eos: 2 %
Hematocrit: 47.4 % (ref 37.5–51.0)
Hemoglobin: 16 g/dL (ref 13.0–17.7)
Immature Grans (Abs): 0 10*3/uL (ref 0.0–0.1)
Immature Granulocytes: 0 %
Lymphocytes Absolute: 3.1 10*3/uL (ref 0.7–3.1)
Lymphs: 27 %
MCH: 29.7 pg (ref 26.6–33.0)
MCHC: 33.8 g/dL (ref 31.5–35.7)
MCV: 88 fL (ref 79–97)
Monocytes Absolute: 0.9 10*3/uL (ref 0.1–0.9)
Monocytes: 8 %
Neutrophils Absolute: 7.4 10*3/uL — ABNORMAL HIGH (ref 1.4–7.0)
Neutrophils: 62 %
Platelets: 254 10*3/uL (ref 150–450)
RBC: 5.38 x10E6/uL (ref 4.14–5.80)
RDW: 13.4 % (ref 11.6–15.4)
WBC: 11.8 10*3/uL — ABNORMAL HIGH (ref 3.4–10.8)

## 2022-09-03 LAB — HEMOGLOBIN A1C
Est. average glucose Bld gHb Est-mCnc: 131 mg/dL
Hgb A1c MFr Bld: 6.2 % — ABNORMAL HIGH (ref 4.8–5.6)

## 2022-09-03 LAB — TSH: TSH: 2.63 u[IU]/mL (ref 0.450–4.500)

## 2022-09-03 LAB — VITAMIN D 25 HYDROXY (VIT D DEFICIENCY, FRACTURES): Vit D, 25-Hydroxy: 39.3 ng/mL (ref 30.0–100.0)

## 2022-09-06 NOTE — Assessment & Plan Note (Signed)
Lexapro was discontinued in the last visit as he was feeling better without it Prefers to take Valium Started Wellbutrin for MDD and anxiety, but did not try If persistent anxiety, may add Valium - explained that Valium would not help anxiety

## 2022-09-06 NOTE — Assessment & Plan Note (Signed)
Lab Results  Component Value Date   HGBA1C 6.2 (H) 09/02/2022   Well-controlled On glipizide, metformin and Jardiance Advised to follow diabetic diet On statin and ARB F/u CMP and lipid panel Diabetic eye exam: Advised to follow up with Ophthalmology for diabetic eye exam

## 2022-09-06 NOTE — Assessment & Plan Note (Signed)
On aspirin and statin Has claudication symptoms Has weak pulse on DPA Vascular surgery referral provided

## 2022-09-30 ENCOUNTER — Other Ambulatory Visit: Payer: Self-pay | Admitting: Internal Medicine

## 2022-09-30 DIAGNOSIS — I10 Essential (primary) hypertension: Secondary | ICD-10-CM

## 2022-09-30 DIAGNOSIS — L603 Nail dystrophy: Secondary | ICD-10-CM | POA: Diagnosis not present

## 2022-09-30 DIAGNOSIS — L84 Corns and callosities: Secondary | ICD-10-CM | POA: Diagnosis not present

## 2022-09-30 DIAGNOSIS — I739 Peripheral vascular disease, unspecified: Secondary | ICD-10-CM | POA: Diagnosis not present

## 2022-09-30 DIAGNOSIS — E1151 Type 2 diabetes mellitus with diabetic peripheral angiopathy without gangrene: Secondary | ICD-10-CM | POA: Diagnosis not present

## 2022-09-30 DIAGNOSIS — E782 Mixed hyperlipidemia: Secondary | ICD-10-CM

## 2022-10-03 ENCOUNTER — Other Ambulatory Visit: Payer: Self-pay | Admitting: Internal Medicine

## 2022-10-03 DIAGNOSIS — E1169 Type 2 diabetes mellitus with other specified complication: Secondary | ICD-10-CM

## 2022-10-16 ENCOUNTER — Ambulatory Visit (INDEPENDENT_AMBULATORY_CARE_PROVIDER_SITE_OTHER): Payer: Medicare HMO | Admitting: Internal Medicine

## 2022-10-16 ENCOUNTER — Encounter: Payer: Self-pay | Admitting: Internal Medicine

## 2022-10-16 VITALS — BP 119/74 | HR 70 | Ht 70.0 in | Wt 221.0 lb

## 2022-10-16 DIAGNOSIS — I10 Essential (primary) hypertension: Secondary | ICD-10-CM

## 2022-10-16 DIAGNOSIS — E1159 Type 2 diabetes mellitus with other circulatory complications: Secondary | ICD-10-CM

## 2022-10-16 DIAGNOSIS — E1169 Type 2 diabetes mellitus with other specified complication: Secondary | ICD-10-CM

## 2022-10-16 DIAGNOSIS — J432 Centrilobular emphysema: Secondary | ICD-10-CM | POA: Diagnosis not present

## 2022-10-16 DIAGNOSIS — I739 Peripheral vascular disease, unspecified: Secondary | ICD-10-CM

## 2022-10-16 DIAGNOSIS — Z720 Tobacco use: Secondary | ICD-10-CM | POA: Diagnosis not present

## 2022-10-16 DIAGNOSIS — F331 Major depressive disorder, recurrent, moderate: Secondary | ICD-10-CM

## 2022-10-16 DIAGNOSIS — Z5320 Procedure and treatment not carried out because of patient's decision for unspecified reasons: Secondary | ICD-10-CM | POA: Diagnosis not present

## 2022-10-16 DIAGNOSIS — N1831 Chronic kidney disease, stage 3a: Secondary | ICD-10-CM

## 2022-10-16 MED ORDER — METFORMIN HCL 500 MG PO TABS: 500.0000 mg | ORAL_TABLET | Freq: Every day | ORAL | 1 refills | Status: DC

## 2022-10-16 MED ORDER — AMLODIPINE BESYLATE 10 MG PO TABS: 10.0000 mg | ORAL_TABLET | Freq: Every day | ORAL | 1 refills | Status: DC

## 2022-10-16 MED ORDER — GLIPIZIDE 5 MG PO TABS: 5.0000 mg | ORAL_TABLET | Freq: Two times a day (BID) | ORAL | 1 refills | Status: DC

## 2022-10-16 MED ORDER — LOSARTAN POTASSIUM 100 MG PO TABS: 100.0000 mg | ORAL_TABLET | Freq: Every day | ORAL | 3 refills | Status: DC

## 2022-10-16 NOTE — Assessment & Plan Note (Signed)
Noted on CT chest Has been trying to cut down smoking Asymptomatic currently 

## 2022-10-16 NOTE — Assessment & Plan Note (Signed)
Smokes about 0.5 pack/day now  Asked about quitting: confirms that he/she currently smokes cigarettes Advise to quit smoking: Educated about QUITTING to reduce the risk of cancer, cardio and cerebrovascular disease. Assess willingness: Unwilling to quit at this time, but is working on cutting back.  Started Wellbutrin for MDD and anxiety. Assist with counseling and pharmacotherapy: Counseled for 5 minutes and literature provided. Arrange for follow up: follow up in 3 months and continue to offer help.

## 2022-10-16 NOTE — Progress Notes (Addendum)
Established Patient Office Visit  Subjective:  Patient ID: Alex Marks, male    DOB: 04/04/1946  Age: 77 y.o. MRN: [id number]  CC:  Chief Complaint  Patient presents with  . Hypertension    Follow up    HPI Alex Marks is a 77 y.o. male with past medical history of HTN, PVD, hep C, type II DM, HLD and depression who presents for f/u of his chronic medical conditions.  He complains of bilateral leg weakness and pain upon walking.  He also has gait disturbance.  Denies any numbness or tingling currently.  He has weak DPA pulse.  He denied vascular surgery referral for now, but agrees to get [place] of LE done. He agrees to see vascular surgeon if [place] of LE shows evidence of PAD.  He is currently taking aspirin and statin.  He has also started taking fish oil and states that it has been helping with his leg weakness (?).  HTN: BP is well-controlled. Takes medications regularly. Patient denies headache, dizziness, chest pain, dyspnea or palpitations.   Type II DM: He takes metformin, glipizide and Jardiance for it.  His last Hgb A1c was 6.2. He does not check blood glucose at home. He denies any fatigue, polyuria or polydipsia.    CKD: Last CMP showed GFR of 64, significantly improved.  Denies any dysuria, hematuria, urinary hesitance or resistance.  MDD: He reports anhedonia, anxiety/agitation and feeling lonely.  He has started Wellbutrin since the last visit, and reports that it is helping him. He has tried multiple antidepressants in the past, but details are unknown.  Amongst them, he has tried Lexapro according to chart review.  He reports that he had better response with Valium in the past.  He currently lives alone and does not have much support from family or friends.    Past Medical History:  Diagnosis Date  . Anxiety   . COPD (chronic obstructive pulmonary disease) (HCC)   . Dark stools 07/08/2019  . Depression   . Depression   . Frequency of urination   . GAD (generalized  anxiety disorder)   . GERD (gastroesophageal reflux disease)   . Hepatitis C    s/p treatment with Dr. Dulce Sellar in 2017. HCV RNA not detected in April 2021  . Hypercholesterolemia   . Hypertension   . PAD (peripheral artery disease) (HCC)   . Scalp lesion 04/12/2019  . Substance abuse (HCC)   . Transfusion of blood product refused for religious reason   . Type II diabetes mellitus (HCC)     Past Surgical History:  Procedure Laterality Date  . ABDOMINAL AORTOGRAM W/LOWER EXTREMITY Bilateral 05/30/2017  . ABDOMINAL AORTOGRAM W/LOWER EXTREMITY N/A 05/30/2017   Procedure: ABDOMINAL AORTOGRAM W/LOWER EXTREMITY;  Surgeon: Sherren Kerns, MD;  Location: [facility];  Service: Cardiovascular;  Laterality: N/A;  Bilateral    Family History  Problem Relation Age of Onset  . Alzheimer's disease Mother   . Mental illness Mother        Depression  . Breast cancer Mother   . Cancer Father        bladder?  [name] Hypertension Father   . Alcohol abuse Father   . Heart disease Sister   . Hearing loss Sister        valve  . Alcohol abuse Brother   . Diabetes Paternal Aunt   . COPD Sister   . Alcohol abuse Brother   . Alcohol abuse Brother   .  Alcohol abuse Brother   . Early death Brother        MVA  . Cancer Maternal Grandmother   . Colon cancer Neg Hx   . Liver cancer Neg Hx     Social History   Socioeconomic History  . Marital status: Single    Spouse name: Not on file  . Number of children: 0  . Years of education: 65  . Highest education level: Not on file  Occupational History  . Occupation: retired    Comment: labor  Tobacco Use  . Smoking status: Former    Current packs/day: 0.00    Average packs/day: 1 pack/day for 60.0 years (60.0 ttl pk-yrs)    Types: Cigarettes    Start date: 07/01/1961    Quit date: 01/29/2021    Years since quitting: 2.0  . Smokeless tobacco: Never  . Tobacco comments:    Has tried to quit, but his depressed mood makes it challenging to  quit  Vaping Use  . Vaping status: Never Used  Substance and Sexual Activity  . Alcohol use: Not Currently    Comment: history of heavy alcohol use; last drank alcohol about 3 years ago  . Drug use: Not Currently    Types: Marijuana, Cocaine, Heroin, "Crack" cocaine    Comment: Last used heroin in his 58s. Last used cocaine and marijuana about 3 years ago.   [name] Sexual activity: Not Currently  Other Topics Concern  . Not on file  Social History Narrative   Single.Live in apartment home   Never had license   Likes to read   Social Determinants of Health   Financial Resource Strain: Low Risk  (07/24/2022)   Overall Financial Resource Strain (CARDIA)   . Difficulty of Paying Living Expenses: Not hard at all  Food Insecurity: No Food Insecurity (07/24/2022)   Hunger Vital Sign   . Worried About Programme researcher, broadcasting/film/video in the Last Year: Never true   . Ran Out of Food in the Last Year: Never true  Transportation Needs: Unmet Transportation Needs (07/24/2022)   PRAPARE - Transportation   . Lack of Transportation (Medical): Yes   . Lack of Transportation (Non-Medical): Yes  Physical Activity: Inactive (07/24/2022)   Exercise Vital Sign   . Days of Exercise per Week: 0 days   . Minutes of Exercise per Session: 0 min  Stress: No Stress Concern Present (07/24/2022)   [employer] of Occupational Health - Occupational Stress Questionnaire   . Feeling of Stress : Not at all  Social Connections: Moderately Isolated (07/24/2022)   Social Connection and Isolation Panel [NHANES]   . Frequency of Communication with Friends and Family: Never   . Frequency of Social Gatherings with Friends and Family: Never   . Attends Religious Services: More than 4 times per year   . Active Member of Clubs or Organizations: Yes   . Attends Banker Meetings: More than 4 times per year   . Marital Status: Never married  Intimate Partner Violence: Not At Risk (07/24/2022)   Humiliation, Afraid, Rape,  and Kick questionnaire   . Fear of Current or Ex-Partner: No   . Emotionally Abused: No   . Physically Abused: No   . Sexually Abused: No    Outpatient Medications Prior to Visit  Medication Sig Dispense Refill  . aspirin 81 MG chewable tablet Chew 1 tablet by mouth daily.    [name] atorvastatin (LIPITOR) 10 MG tablet TAKE 1 TABLET(10 MG) BY MOUTH DAILY  90 tablet 1  . blood glucose meter kit and supplies Dispense based on patient and insurance preference. Use up to four times daily as directed. (FOR ICD-10 E10.9, E11.9). 1 each 0  . Carboxymethylcell-Glycerin PF (REFRESH OPTIVE PF) 0.5-0.9 % SOLN Place 2 drops into both eyes daily as needed (Irritation).    . Cholecalciferol (VITAMIN D3) 25 MCG (1000 UT) CAPS Take 1 capsule (1,000 Units total) by mouth in the morning and at bedtime. 60 capsule 0  . fexofenadine (ALLEGRA) 180 MG tablet Take 180 mg by mouth daily.    . Multiple Vitamin (MULTIVITAMIN WITH MINERALS) TABS tablet Take 1 tablet by mouth daily. [employer]    . Omega-3 Fatty Acids (FISH OIL PEARLS) 300 MG CAPS Take 600 mg by mouth 2 (two) times daily. (Patient taking differently: Take 1,000 mg by mouth at bedtime.) 60 capsule 3  . oxymetazoline (NASAL SPRAY MOISTURIZING 12 HR) 0.05 % nasal spray Place 1 spray into both nostrils daily as needed for congestion.    . vitamin C (ASCORBIC ACID) 500 MG tablet Take 500 mg by mouth at bedtime.    [name] amLODipine (NORVASC) 10 MG tablet Take 1 tablet (10 mg total) by mouth daily. 90 tablet 1  . buPROPion (WELLBUTRIN XL) 150 MG 24 hr tablet Take 1 tablet (150 mg total) by mouth daily. 30 tablet 3  . glipiZIDE (GLUCOTROL) 5 MG tablet TAKE 1 TABLET(5 MG) BY MOUTH TWICE DAILY BEFORE A MEAL 180 tablet 0  . JARDIANCE 10 MG TABS tablet TAKE 1 TABLET(10 MG) BY MOUTH DAILY BEFORE BREAKFAST 30 tablet 2  . losartan (COZAAR) 100 MG tablet TAKE 1 TABLET(100 MG) BY MOUTH DAILY 90 tablet 0  . metFORMIN (GLUCOPHAGE) 500 MG tablet Take 1 tablet (500 mg total) by  mouth daily with breakfast. 180 tablet 1   No facility-administered medications prior to visit.    Allergies  Allergen Reactions  . Onion     Bags under eyes, indigestion   . Other Swelling    Hot sauce -- causes eye swelling Spicy food    ROS Review of Systems  Constitutional:  Negative for chills and fever.  HENT:  Negative for congestion and sore throat.   Eyes:  Negative for pain and discharge.  Respiratory:  Negative for cough and shortness of breath.   Cardiovascular:  Negative for chest pain and palpitations.  Gastrointestinal:  Negative for diarrhea, nausea and vomiting.  Endocrine: Negative for polydipsia and polyuria.  Genitourinary:  Negative for dysuria and hematuria.  Musculoskeletal:  Positive for arthralgias, gait problem and myalgias. Negative for neck pain and neck stiffness.  Skin:  Negative for rash.  Neurological:  Negative for dizziness, weakness, numbness and headaches.  Psychiatric/Behavioral:  Positive for dysphoric mood and sleep disturbance. Negative for agitation, behavioral problems and suicidal ideas. The patient is nervous/anxious.       Objective:    Physical Exam Vitals reviewed.  Constitutional:      General: He is not in acute distress.    Appearance: He is not diaphoretic.  HENT:     Head: Normocephalic and atraumatic.     Nose: Nose normal.     Mouth/Throat:     Mouth: Mucous membranes are moist.  Eyes:     General: No scleral icterus.    Extraocular Movements: Extraocular movements intact.  Cardiovascular:     Rate and Rhythm: Normal rate and regular rhythm.     Heart sounds: Normal heart sounds. No murmur heard. Pulmonary:  Breath sounds: Normal breath sounds. No wheezing or rales.  Musculoskeletal:     Cervical back: Neck supple. No tenderness.     Right lower leg: No edema.     Left lower leg: No edema.  Skin:    General: Skin is warm.     Findings: No rash.  Neurological:     General: No focal deficit present.      Mental Status: He is alert and oriented to person, place, and time. Mental status is at baseline.     Cranial Nerves: No cranial nerve deficit.     Sensory: No sensory deficit.     Motor: Weakness (4/5 in b/l LE) present.  Psychiatric:        Mood and Affect: Mood is depressed.        Behavior: Behavior normal.    BP 119/74 (BP Location: Right Arm, Patient Position: Sitting, Cuff Size: Large)   Pulse 70   Ht 5\' 10"  (1.778 m)   Wt 221 lb (100.2 kg)   SpO2 95%   BMI 31.71 kg/m  Wt Readings from Last 3 Encounters:  10/16/22 221 lb (100.2 kg)  09/02/22 228 lb 9.6 oz (103.7 kg)  07/24/22 228 lb (103.4 kg)    Lab Results  Component Value Date   TSH 2.630 09/02/2022   Lab Results  Component Value Date   WBC 11.8 (H) 09/02/2022   HGB 16.0 09/02/2022   HCT 47.4 09/02/2022   MCV 88 09/02/2022   PLT 254 09/02/2022   Lab Results  Component Value Date   NA 140 09/02/2022   K 4.0 09/02/2022   CO2 18 (L) 09/02/2022   GLUCOSE 68 (L) 09/02/2022   BUN 15 09/02/2022   CREATININE 1.19 09/02/2022   BILITOT 0.5 09/02/2022   ALKPHOS 134 (H) 09/02/2022   AST 26 09/02/2022   ALT 36 09/02/2022   PROT 7.2 09/02/2022   ALBUMIN 4.5 09/02/2022   CALCIUM 9.7 09/02/2022   ANIONGAP 11 11/16/2019   EGFR 64 09/02/2022   Lab Results  Component Value Date   CHOL 124 09/02/2022   Lab Results  Component Value Date   HDL 35 (L) 09/02/2022   Lab Results  Component Value Date   LDLCALC 72 09/02/2022   Lab Results  Component Value Date   TRIG 90 09/02/2022   Lab Results  Component Value Date   CHOLHDL 3.5 09/02/2022   Lab Results  Component Value Date   HGBA1C 6.2 (H) 09/02/2022      Assessment & Plan:   Problem List Items Addressed This Visit       Cardiovascular and Mediastinum   Essential hypertension - Primary    BP Readings from Last 1 Encounters:  10/16/22 119/74   Usually well-controlled with Losartan Counseled for compliance with the medications Advised DASH  diet and moderate exercise/walking, at least 150 mins/week      Relevant Medications   losartan (COZAAR) 100 MG tablet   amLODipine (NORVASC) 10 MG tablet   PVD (peripheral vascular disease) (HCC)    On aspirin and statin Has claudication symptoms Has weak pulse on DPA Vascular surgery referral provided, but he denied. Check [place] LE arterial      Relevant Medications   losartan (COZAAR) 100 MG tablet   amLODipine (NORVASC) 10 MG tablet   Other Relevant Orders   [place] Lower Ext Art Bilat     Respiratory   Centrilobular emphysema (HCC)    Noted on CT chest Has been trying to cut  down smoking Asymptomatic currently        Endocrine   Type 2 diabetes mellitus (HCC)    Lab Results  Component Value Date   HGBA1C 6.2 (H) 09/02/2022   Well-controlled On glipizide, metformin and Jardiance Advised to follow diabetic diet On statin and ARB F/u CMP and lipid panel Diabetic eye exam: Advised to follow up with Ophthalmology for diabetic eye exam      Relevant Medications   metFORMIN (GLUCOPHAGE) 500 MG tablet   losartan (COZAAR) 100 MG tablet   glipiZIDE (GLUCOTROL) 5 MG tablet     Genitourinary   Stage 3a chronic kidney disease (HCC)    Last CMP showed GFR of 64, overall improved Check BMP  Checked urine microalbumin/creatinine ratio - On losartan and Jardiance Avoid nephrotoxic agents Advised to maintain adequate hydration -needs to cut down soft drinks intake        Other   Depression (Chronic)    Lexapro was discontinued in the last visit as he was feeling better without it Prefers to take Valium Started Wellbutrin for MDD and anxiety, symptoms improved If persistent anxiety, may add Valium - explained that Valium would not help depression      Tobacco abuse    Smokes about 0.5 pack/day now  Asked about quitting: confirms that he/she currently smokes cigarettes Advise to quit smoking: Educated about QUITTING to reduce the risk of cancer, cardio and  cerebrovascular disease. Assess willingness: Unwilling to quit at this time, but is working on cutting back.  Started Wellbutrin for MDD and anxiety. Assist with counseling and pharmacotherapy: Counseled for 5 minutes and literature provided. Arrange for follow up: follow up in 3 months and continue to offer help.      Other Visit Diagnoses     Colon cancer screening declined           Meds ordered this encounter  Medications  . metFORMIN (GLUCOPHAGE) 500 MG tablet    Sig: Take 1 tablet (500 mg total) by mouth daily with breakfast.    Dispense:  180 tablet    Refill:  1  . losartan (COZAAR) 100 MG tablet    Sig: Take 1 tablet (100 mg total) by mouth daily.    Dispense:  90 tablet    Refill:  3  . glipiZIDE (GLUCOTROL) 5 MG tablet    Sig: Take 1 tablet (5 mg total) by mouth 2 (two) times daily before a meal.    Dispense:  180 tablet    Refill:  1  . amLODipine (NORVASC) 10 MG tablet    Sig: Take 1 tablet (10 mg total) by mouth daily.    Dispense:  90 tablet    Refill:  1    Follow-up: Return in about 4 months (around 02/15/2023) for DM and PAD.    Anabel Halon, MD

## 2022-10-16 NOTE — Assessment & Plan Note (Addendum)
On aspirin and statin Has claudication symptoms Has weak pulse on DPA Vascular surgery referral provided, but he denied. Check [place] LE arterial

## 2022-10-16 NOTE — Patient Instructions (Addendum)
Please continue to take medications as prescribed.  Please continue to follow low carb diet and perform moderate exercise/walking at least 150 mins/week.  Please get [place] of legs done as scheduled.

## 2022-10-16 NOTE — Assessment & Plan Note (Signed)
Lab Results  Component Value Date   HGBA1C 6.2 (H) 09/02/2022   Well-controlled On glipizide, metformin and Jardiance Advised to follow diabetic diet On statin and ARB F/u CMP and lipid panel Diabetic eye exam: Advised to follow up with Ophthalmology for diabetic eye exam 

## 2022-10-16 NOTE — Assessment & Plan Note (Addendum)
Lexapro was discontinued in the last visit as he was feeling better without it Prefers to take Valium Started Wellbutrin for MDD and anxiety, symptoms improved If persistent anxiety, may add Valium - explained that Valium would not help depression

## 2022-10-16 NOTE — Assessment & Plan Note (Signed)
BP Readings from Last 1 Encounters:  10/16/22 119/74   Usually well-controlled with Losartan Counseled for compliance with the medications Advised DASH diet and moderate exercise/walking, at least 150 mins/week

## 2022-10-16 NOTE — Assessment & Plan Note (Signed)
Last CMP showed GFR of 64, overall improved Check BMP  Checked urine microalbumin/creatinine ratio - On losartan and Jardiance Avoid nephrotoxic agents Advised to maintain adequate hydration -needs to cut down soft drinks intake

## 2022-11-26 ENCOUNTER — Other Ambulatory Visit: Payer: Self-pay | Admitting: Emergency Medicine

## 2022-11-26 DIAGNOSIS — Z122 Encounter for screening for malignant neoplasm of respiratory organs: Secondary | ICD-10-CM

## 2022-11-26 DIAGNOSIS — F17210 Nicotine dependence, cigarettes, uncomplicated: Secondary | ICD-10-CM

## 2022-11-26 DIAGNOSIS — Z87891 Personal history of nicotine dependence: Secondary | ICD-10-CM

## 2022-12-26 ENCOUNTER — Encounter: Payer: Self-pay | Admitting: Physician Assistant

## 2022-12-26 ENCOUNTER — Ambulatory Visit (INDEPENDENT_AMBULATORY_CARE_PROVIDER_SITE_OTHER): Payer: Medicare HMO | Admitting: Physician Assistant

## 2022-12-26 DIAGNOSIS — F17210 Nicotine dependence, cigarettes, uncomplicated: Secondary | ICD-10-CM | POA: Diagnosis not present

## 2022-12-26 NOTE — Patient Instructions (Signed)
Thank you for participating in the [facility]. It was our pleasure to meet you today. We will call you with the results of your scan within the next few days. Your scan will be assigned a Lung RADS category score by the physicians reading the scans.  This Lung RADS score determines follow up scanning.  See below for description of categories, and follow up screening recommendations. We will be in touch to schedule your follow up screening annually or based on recommendations of our providers. We will fax a copy of your scan results to your Primary Care Physician, or the physician who referred you to the program, to ensure they have the results. Please call the office if you have any questions or concerns regarding your scanning experience or results.  Our office number is [phone]. Please speak with Denise Phelps, RN. , or  Denise Buckner RN, They are  our Lung Cancer Screening RN.'s If They are unavailable when you call, Please leave a message on the voice mail. We will return your call at our earliest convenience.This voice mail is monitored several times a day.  Remember, if your scan is normal, we will scan you annually as long as you continue to meet the criteria for the program. (Age 50-80, Current smoker or smoker who has quit within the last 15 years). If you are a smoker, remember, quitting is the single most powerful action that you can take to decrease your risk of lung cancer and other pulmonary, breathing related problems. We know quitting is hard, and we are here to help.  Please let us know if there is anything we can do to help you meet your goal of quitting. If you are a former smoker, congratulations. We are proud of you! Remain smoke free! Remember you can refer friends or family members through the number above.  We will screen them to make sure they meet criteria for the program. Thank you for helping us take better care of you by  participating in Lung Screening.  You can receive free nicotine replacement therapy ( patches, gum or mints) by calling 1-800-QUIT NOW. Please call so we can get you on the path to becoming  a non-smoker. I know it is hard, but you can do this!  Lung RADS Categories:  Lung RADS 1: no nodules or definitely non-concerning nodules.  Recommendation is for a repeat annual scan in 12 months.  Lung RADS 2:  nodules that are non-concerning in appearance and behavior with a very low likelihood of becoming an active cancer. Recommendation is for a repeat annual scan in 12 months.  Lung RADS 3: nodules that are probably non-concerning , includes nodules with a low likelihood of becoming an active cancer.  Recommendation is for a 6-month repeat screening scan. Often noted after an upper respiratory illness. We will be in touch to make sure you have no questions, and to schedule your 6-month scan.  Lung RADS 4 A: nodules with concerning findings, recommendation is most often for a follow up scan in 3 months or additional testing based on our provider's assessment of the scan. We will be in touch to make sure you have no questions and to schedule the recommended 3 month follow up scan.  Lung RADS 4 B:  indicates findings that are concerning. We will be in touch with you to schedule additional diagnostic testing based on our provider's  assessment of the scan.  Other options for assistance in smoking cessation (   As covered by your insurance benefits)  Hypnosis for smoking cessation  [employer] [phone]  Acupuncture for smoking cessation  [facility] [phone]   

## 2022-12-26 NOTE — Progress Notes (Signed)
Virtual Visit via Telephone Note  I connected with Nobel Brar on 12/26/22 at  1000 by telephone and verified that I am speaking with the correct person using two identifiers.  Location: Patient: home Provider: working virtually from home   I discussed the limitations, risks, security and privacy concerns of performing an evaluation and management service by telephone and the availability of in person appointments. I also discussed with the patient that there may be a patient responsible charge related to this service. The patient expressed understanding and agreed to proceed.     Shared Decision Making Visit Lung Cancer Screening Program [phone])   Eligibility: Age 66 Pack Years Smoking History Calculation 60 (# packs/per year x # years smoked) Recent History of coughing up blood  No Unexplained weight loss? No ( >Than 15 pounds within the last 6 months ) Prior History Lung / other cancer No (Diagnosis within the last 5 years already requiring surveillance chest CT Scans). Smoking Status Current Smoker  Visit Components: Discussion included one or more decision making aids. Yes Discussion included risk/benefits of screening. Yes Discussion included potential follow up diagnostic testing for abnormal scans. Yes Discussion included meaning and risk of over diagnosis. Yes Discussion included meaning and risk of False Positives. Yes Discussion included meaning of total radiation exposure. Yes  Counseling Included: Importance of adherence to annual lung cancer LDCT screening. Yes Impact of comorbidities on ability to participate in the program. Yes Ability and willingness to under diagnostic treatment: Yes  Smoking Cessation Counseling: Current Smokers:  Discussed importance of smoking cessation. Yes Information about tobacco cessation classes and interventions provided to patient. Yes Symptomatic Patient. No Diagnosis Code: Tobacco Use Z72.0 Asymptomatic Patient  Yes  Counseling (Intermediate counseling: > three minutes counseling) [id number] Information about tobacco cessation classes and interventions provided to patient. Yes Written Order for Lung Cancer Screening with LDCT placed in Epic. Yes (CT Chest Lung Cancer Screening Low Dose W/O CM) [id number] Z12.2-Screening of respiratory organs Z87.891-Personal history of nicotine dependence   I have spent 25 minutes of face to face/ virtual visit  time with the patient discussing the risks and benefits of lung cancer screening. We discussed the above noted topics. We paused at intervals to allow for questions to be asked and answered to ensure understanding.We discussed that the single most powerful action that anyone can take to decrease their risk of developing lung cancer is to quit smoking.  We discussed options for tools to aid in quitting smoking including nicotine replacement therapy, non-nicotine medications, support groups, Quit Smart classes, and behavior modification. We discussed that often times setting smaller, more achievable goals, such as eliminating 1 cigarette a day for a week and then 2 cigarettes a day for a week can be helpful in slowly decreasing the number of cigarettes smoked. I provided  them  with smoking cessation  information  with contact information for community resources, classes, free nicotine replacement therapy, and access to mobile apps, text messaging, and on-line smoking cessation help. I have also provided  them  the office contact information in the event they have any questions. We discussed the time and location of the scan, and that either Abigail Miyamoto RN, Karlton Lemon, RN  or I will call / send a letter with the results within 24-72 hours of receiving them. The patient verbalized understanding of all of  the above and had no further questions upon leaving the office. They have my contact information in the event they have any further questions.  I spent one minute counseling  on smoking cessation and the health risks of continued tobacco abuse.  I explained to the patient that there has been a high incidence of coronary artery disease noted on these exams. I explained that this is a non-gated exam therefore degree or severity cannot be determined. This patient is on statin therapy. I have asked the patient to follow-up with their PCP regarding any incidental finding of coronary artery disease and management with diet or medication as their PCP  feels is clinically indicated. The patient verbalized understanding of the above and had no further questions upon completion of the visit.    Darcella Gasman Sally-Anne Wamble, PA-C

## 2022-12-30 ENCOUNTER — Telehealth: Payer: Self-pay | Admitting: Internal Medicine

## 2022-12-30 ENCOUNTER — Ambulatory Visit (HOSPITAL_COMMUNITY)
Admission: RE | Admit: 2022-12-30 | Discharge: 2022-12-30 | Disposition: A | Discharge status: Home / Self Care | Payer: Medicare HMO | Source: Ambulatory Visit | Attending: Acute Care | Admitting: Acute Care

## 2022-12-30 ENCOUNTER — Other Ambulatory Visit: Payer: Self-pay

## 2022-12-30 DIAGNOSIS — Z122 Encounter for screening for malignant neoplasm of respiratory organs: Secondary | ICD-10-CM | POA: Diagnosis not present

## 2022-12-30 DIAGNOSIS — F17210 Nicotine dependence, cigarettes, uncomplicated: Secondary | ICD-10-CM

## 2022-12-30 DIAGNOSIS — Z87891 Personal history of nicotine dependence: Secondary | ICD-10-CM | POA: Diagnosis not present

## 2022-12-30 DIAGNOSIS — F331 Major depressive disorder, recurrent, moderate: Secondary | ICD-10-CM

## 2022-12-30 DIAGNOSIS — E1169 Type 2 diabetes mellitus with other specified complication: Secondary | ICD-10-CM

## 2022-12-30 MED ORDER — EMPAGLIFLOZIN 10 MG PO TABS
ORAL_TABLET | ORAL | 2 refills | Status: DC
Start: 2022-12-30 — End: 2023-01-09

## 2022-12-30 MED ORDER — BUPROPION HCL ER (XL) 150 MG PO TB24
150.0000 mg | ORAL_TABLET | Freq: Every day | ORAL | 3 refills | Status: DC
Start: 2022-12-30 — End: 2023-01-09

## 2022-12-30 NOTE — Telephone Encounter (Signed)
Refills sent to pharmacy. 

## 2022-12-30 NOTE — Telephone Encounter (Signed)
Prescription Request  12/30/2022  LOV: 10/16/2022  What is the name of the medication or equipment? JARDIANCE 10 MG TABS tablet [[id number]]   buPROPion (WELLBUTRIN XL) 150 MG 24 hr tablet [[id number]]   Have you contacted your pharmacy to request a refill? Yes   Which pharmacy would you like this sent to?  [facility]  Patient notified that their request is being sent to the clinical staff for review and that they should receive a response within 2 business days.   Please advise at  [facility]

## 2023-01-03 ENCOUNTER — Other Ambulatory Visit: Payer: Self-pay

## 2023-01-03 DIAGNOSIS — Z122 Encounter for screening for malignant neoplasm of respiratory organs: Secondary | ICD-10-CM

## 2023-01-03 DIAGNOSIS — Z87891 Personal history of nicotine dependence: Secondary | ICD-10-CM

## 2023-01-03 DIAGNOSIS — F17210 Nicotine dependence, cigarettes, uncomplicated: Secondary | ICD-10-CM

## 2023-01-09 ENCOUNTER — Other Ambulatory Visit: Payer: Self-pay | Admitting: Internal Medicine

## 2023-01-09 DIAGNOSIS — F331 Major depressive disorder, recurrent, moderate: Secondary | ICD-10-CM

## 2023-01-09 DIAGNOSIS — E1169 Type 2 diabetes mellitus with other specified complication: Secondary | ICD-10-CM

## 2023-01-27 DIAGNOSIS — L84 Corns and callosities: Secondary | ICD-10-CM | POA: Diagnosis not present

## 2023-01-27 DIAGNOSIS — I739 Peripheral vascular disease, unspecified: Secondary | ICD-10-CM | POA: Diagnosis not present

## 2023-01-27 DIAGNOSIS — E1151 Type 2 diabetes mellitus with diabetic peripheral angiopathy without gangrene: Secondary | ICD-10-CM | POA: Diagnosis not present

## 2023-01-27 DIAGNOSIS — L603 Nail dystrophy: Secondary | ICD-10-CM | POA: Diagnosis not present

## 2023-02-10 ENCOUNTER — Ambulatory Visit (INDEPENDENT_AMBULATORY_CARE_PROVIDER_SITE_OTHER): Payer: Medicare HMO

## 2023-02-10 DIAGNOSIS — E1159 Type 2 diabetes mellitus with other circulatory complications: Secondary | ICD-10-CM | POA: Diagnosis not present

## 2023-02-10 NOTE — Progress Notes (Signed)
Alex Marks arrived 02/10/2023 and has given verbal consent to obtain images and complete their overdue diabetic retinal screening.  The images have been sent to an ophthalmologist or optometrist for review and interpretation.  Results will be sent back to Anabel Halon, MD for review.  Patient has been informed they will be contacted when we receive the results via telephone or MyChart

## 2023-02-17 ENCOUNTER — Encounter: Payer: Self-pay | Admitting: Internal Medicine

## 2023-02-17 ENCOUNTER — Ambulatory Visit: Payer: Medicare HMO | Admitting: Internal Medicine

## 2023-02-17 VITALS — BP 144/86 | HR 69 | Ht 70.0 in | Wt 225.8 lb

## 2023-02-17 DIAGNOSIS — E1159 Type 2 diabetes mellitus with other circulatory complications: Secondary | ICD-10-CM

## 2023-02-17 DIAGNOSIS — Z7984 Long term (current) use of oral hypoglycemic drugs: Secondary | ICD-10-CM

## 2023-02-17 DIAGNOSIS — I10 Essential (primary) hypertension: Secondary | ICD-10-CM

## 2023-02-17 DIAGNOSIS — F331 Major depressive disorder, recurrent, moderate: Secondary | ICD-10-CM | POA: Diagnosis not present

## 2023-02-17 DIAGNOSIS — I739 Peripheral vascular disease, unspecified: Secondary | ICD-10-CM | POA: Diagnosis not present

## 2023-02-17 DIAGNOSIS — N1831 Chronic kidney disease, stage 3a: Secondary | ICD-10-CM

## 2023-02-17 DIAGNOSIS — J432 Centrilobular emphysema: Secondary | ICD-10-CM

## 2023-02-17 MED ORDER — AMLODIPINE-OLMESARTAN 10-40 MG PO TABS: 1.0000 | ORAL_TABLET | Freq: Every day | ORAL | 1 refills | Status: DC

## 2023-02-17 NOTE — Assessment & Plan Note (Signed)
On aspirin and statin Has claudication symptoms Has weak pulse on DPA Vascular surgery referral provided, but he denied. Check [place] ABI

## 2023-02-17 NOTE — Assessment & Plan Note (Signed)
Noted on CT chest Has been trying to cut down smoking Asymptomatic currently 

## 2023-02-17 NOTE — Progress Notes (Signed)
Established Patient Office Visit  Subjective:  Patient ID: Alex Marks, male    DOB: 1946-06-17  Age: 77 y.o. MRN: [id number]  CC:  Chief Complaint  Patient presents with   Hypertension    Four month follow up    HPI Alex Marks is a 77 y.o. male with past medical history of HTN, PVD, hep C, type II DM, HLD and depression who presents for f/u of his chronic medical conditions.  He complains of bilateral leg weakness and pain upon walking.  He also has gait disturbance.  Denies any numbness or tingling currently.  He has weak DPA pulse.  He denied vascular surgery referral for now, but agrees to get [place] ABI done. He agrees to see vascular surgeon if US shows evidence of PAD.  He is currently taking aspirin and statin.  He has also started taking fish oil and states that it has been helping with his leg weakness (?).  HTN: BP is well-controlled. Takes medications regularly. Patient denies headache, dizziness, chest pain, dyspnea or palpitations.   Type II DM: He takes metformin, glipizide and Jardiance for it.  His last Hgb A1c was 6.2. He does not check blood glucose at home. He denies any fatigue, polyuria or polydipsia.    CKD: Last CMP showed GFR of 64, significantly improved.  Denies any dysuria, hematuria, urinary hesitance or resistance.  MDD: He had anhedonia, anxiety/agitation and feeling lonely.  He has started Wellbutrin since the last visit, and reports that it is helping him. He has tried multiple antidepressants in the past, but details are unknown.  Amongst them, he has tried Lexapro according to chart review.  He reports that he had better response with Valium in the past.  He currently lives alone and does not have much support from family or friends.    Past Medical History:  Diagnosis Date   Anxiety    COPD (chronic obstructive pulmonary disease) (HCC)    Dark stools 07/08/2019   Depression    Depression    Frequency of urination    GAD (generalized anxiety  disorder)    GERD (gastroesophageal reflux disease)    Hepatitis C    s/p treatment with Dr. Dulce Sellar in 2017. HCV RNA not detected in April 2021   Hypercholesterolemia    Hypertension    PAD (peripheral artery disease) (HCC)    Scalp lesion 04/12/2019   Substance abuse (HCC)    Transfusion of blood product refused for religious reason    Type II diabetes mellitus [facility])     Past Surgical History:  Procedure Laterality Date   ABDOMINAL AORTOGRAM W/LOWER EXTREMITY Bilateral 05/30/2017   ABDOMINAL AORTOGRAM W/LOWER EXTREMITY N/A 05/30/2017   Procedure: ABDOMINAL AORTOGRAM W/LOWER EXTREMITY;  Surgeon: Sherren Kerns, MD;  Location: [facility] INVASIVE [facility];  Service: Cardiovascular;  Laterality: N/A;  Bilateral    Family History  Problem Relation Age of Onset   Alzheimer's disease Mother    Mental illness Mother        Depression   Breast cancer Mother    Cancer Father        bladder?   Hypertension Father    Alcohol abuse Father    Heart disease Sister    Hearing loss Sister        valve   Alcohol abuse Brother    Diabetes Paternal Aunt    COPD Sister    Alcohol abuse Brother    Alcohol abuse Brother    Alcohol  abuse Brother    Early death Brother        MVA   Cancer Maternal Grandmother    Colon cancer Neg Hx    Liver cancer Neg Hx     Social History   Socioeconomic History   Marital status: Single    Spouse name: Not on file   Number of children: 0   Years of education: 11   Highest education level: Not on file  Occupational History   Occupation: retired    Comment: labor  Tobacco Use   Smoking status: Former    Current packs/day: 0.00    Average packs/day: 1 pack/day for 60.0 years (60.0 ttl pk-yrs)    Types: Cigarettes    Start date: 07/01/1961    Quit date: 01/29/2021    Years since quitting: 2.0   Smokeless tobacco: Never   Tobacco comments:    Has tried to quit, but his depressed mood makes it challenging to quit  Vaping Use   Vaping status: Never Used   Substance and Sexual Activity   Alcohol use: Not Currently    Comment: history of heavy alcohol use; last drank alcohol about 3 years ago   Drug use: Not Currently    Types: Marijuana, Cocaine, Heroin, "Crack" cocaine    Comment: Last used heroin in his 92s. Last used cocaine and marijuana about 3 years ago.    Sexual activity: Not Currently  Other Topics Concern   Not on file  Social History Narrative   Single.Live in apartment home   Never had license   Likes to read   Social Determinants of Health   Financial Resource Strain: Low Risk  (07/24/2022)   Overall Financial Resource Strain (CARDIA)    Difficulty of Paying Living Expenses: Not hard at all  Food Insecurity: No Food Insecurity (07/24/2022)   Hunger Vital Sign    Worried About Running Out of Food in the Last Year: Never true    Ran Out of Food in the Last Year: Never true  Transportation Needs: Unmet Transportation Needs (07/24/2022)   PRAPARE - Administrator, Civil Service (Medical): Yes    Lack of Transportation (Non-Medical): Yes  Physical Activity: Inactive (07/24/2022)   Exercise Vital Sign    Days of Exercise per Week: 0 days    Minutes of Exercise per Session: 0 min  Stress: No Stress Concern Present (07/24/2022)   [employer] of Occupational Health - Occupational Stress Questionnaire    Feeling of Stress : Not at all  Social Connections: Moderately Isolated (07/24/2022)   Social Connection and Isolation Panel [NHANES]    Frequency of Communication with Friends and Family: Never    Frequency of Social Gatherings with Friends and Family: Never    Attends Religious Services: More than 4 times per year    Active Member of [employer] or Organizations: Yes    Attends Banker Meetings: More than 4 times per year    Marital Status: Never married  Intimate Partner Violence: Not At Risk (07/24/2022)   Humiliation, Afraid, Rape, and Kick questionnaire    Fear of Current or Ex-Partner: No     Emotionally Abused: No    Physically Abused: No    Sexually Abused: No    Outpatient Medications Prior to Visit  Medication Sig Dispense Refill   aspirin 81 MG chewable tablet Chew 1 tablet by mouth daily.     atorvastatin (LIPITOR) 10 MG tablet TAKE 1 TABLET(10 MG) BY MOUTH DAILY 90  tablet 1   blood glucose meter kit and supplies Dispense based on patient and insurance preference. Use up to four times daily as directed. (FOR ICD-10 E10.9, E11.9). 1 each 0   buPROPion (WELLBUTRIN XL) 150 MG 24 hr tablet TAKE 1 TABLET(150 MG) BY MOUTH DAILY 30 tablet 3   Carboxymethylcell-Glycerin PF (REFRESH OPTIVE PF) 0.5-0.9 % SOLN Place 2 drops into both eyes daily as needed (Irritation).     Cholecalciferol (VITAMIN D3) 25 MCG (1000 UT) CAPS Take 1 capsule (1,000 Units total) by mouth in the morning and at bedtime. 60 capsule 0   fexofenadine (ALLEGRA) 180 MG tablet Take 180 mg by mouth daily.     glipiZIDE (GLUCOTROL) 5 MG tablet Take 1 tablet (5 mg total) by mouth 2 (two) times daily before a meal. 180 tablet 1   JARDIANCE 10 MG TABS tablet TAKE 1 TABLET(10 MG) BY MOUTH DAILY BEFORE BREAKFAST 30 tablet 2   metFORMIN (GLUCOPHAGE) 500 MG tablet Take 1 tablet (500 mg total) by mouth daily with breakfast. 180 tablet 1   Multiple Vitamin (MULTIVITAMIN WITH MINERALS) TABS tablet Take 1 tablet by mouth daily. Senior Centrum     Omega-3 Fatty Acids (FISH OIL PEARLS) 300 MG CAPS Take 600 mg by mouth 2 (two) times daily. (Patient taking differently: Take 1,000 mg by mouth at bedtime.) 60 capsule 3   oxymetazoline (NASAL SPRAY MOISTURIZING 12 HR) 0.05 % nasal spray Place 1 spray into both nostrils daily as needed for congestion.     vitamin C (ASCORBIC ACID) 500 MG tablet Take 500 mg by mouth at bedtime.     amLODipine (NORVASC) 10 MG tablet Take 1 tablet (10 mg total) by mouth daily. 90 tablet 1   losartan (COZAAR) 100 MG tablet Take 1 tablet (100 mg total) by mouth daily. 90 tablet 3   No facility-administered  medications prior to visit.    Allergies  Allergen Reactions   Onion     Bags under eyes, indigestion    Other Swelling    Hot sauce -- causes eye swelling Spicy food    ROS Review of Systems  Constitutional:  Negative for chills and fever.  HENT:  Negative for congestion and sore throat.   Eyes:  Negative for pain and discharge.  Respiratory:  Negative for cough and shortness of breath.   Cardiovascular:  Negative for chest pain and palpitations.  Gastrointestinal:  Negative for diarrhea, nausea and vomiting.  Endocrine: Negative for polydipsia and polyuria.  Genitourinary:  Negative for dysuria and hematuria.  Musculoskeletal:  Positive for arthralgias, gait problem and myalgias. Negative for neck pain and neck stiffness.  Skin:  Negative for rash.  Neurological:  Negative for dizziness, weakness, numbness and headaches.  Psychiatric/Behavioral:  Positive for sleep disturbance. Negative for agitation, behavioral problems and suicidal ideas. The patient is nervous/anxious.       Objective:    Physical Exam Vitals reviewed.  Constitutional:      General: He is not in acute distress.    Appearance: He is not diaphoretic.  HENT:     Head: Normocephalic and atraumatic.     Nose: Nose normal.     Mouth/Throat:     Mouth: Mucous membranes are moist.  Eyes:     General: No scleral icterus.    Extraocular Movements: Extraocular movements intact.  Cardiovascular:     Rate and Rhythm: Normal rate and regular rhythm.     Heart sounds: Normal heart sounds. No murmur heard. Pulmonary:  Breath sounds: Normal breath sounds. No wheezing or rales.  Musculoskeletal:     Cervical back: Neck supple. No tenderness.     Right lower leg: No edema.     Left lower leg: No edema.  Skin:    General: Skin is warm.     Findings: No rash.  Neurological:     General: No focal deficit present.     Mental Status: He is alert and oriented to person, place, and time. Mental status is at  baseline.     Cranial Nerves: No cranial nerve deficit.     Sensory: No sensory deficit.     Motor: Weakness (4/5 in b/l LE) present.  Psychiatric:        Mood and Affect: Mood normal.        Behavior: Behavior normal.     BP (!) 144/86 (BP Location: Right Arm)   Pulse 69   Ht 5\' 10"  (1.778 m)   Wt 225 lb 12.8 oz (102.4 kg)   SpO2 95%   BMI 32.40 kg/m  Wt Readings from Last 3 Encounters:  02/17/23 225 lb 12.8 oz (102.4 kg)  10/16/22 221 lb (100.2 kg)  09/02/22 228 lb 9.6 oz (103.7 kg)    Lab Results  Component Value Date   TSH 2.630 09/02/2022   Lab Results  Component Value Date   WBC 11.8 (H) 09/02/2022   HGB 16.0 09/02/2022   HCT 47.4 09/02/2022   MCV 88 09/02/2022   PLT 254 09/02/2022   Lab Results  Component Value Date   NA 141 02/17/2023   K 4.2 02/17/2023   CO2 17 (L) 02/17/2023   GLUCOSE 101 (H) 02/17/2023   BUN 20 02/17/2023   CREATININE 1.26 02/17/2023   BILITOT 0.8 02/17/2023   ALKPHOS 125 (H) 02/17/2023   AST 24 02/17/2023   ALT 27 02/17/2023   PROT 6.9 02/17/2023   ALBUMIN 4.3 02/17/2023   CALCIUM 9.5 02/17/2023   ANIONGAP 11 11/16/2019   EGFR 59 (L) 02/17/2023   Lab Results  Component Value Date   CHOL 124 09/02/2022   Lab Results  Component Value Date   HDL 35 (L) 09/02/2022   Lab Results  Component Value Date   LDLCALC 72 09/02/2022   Lab Results  Component Value Date   TRIG 90 09/02/2022   Lab Results  Component Value Date   CHOLHDL 3.5 09/02/2022   Lab Results  Component Value Date   HGBA1C 6.3 (H) 02/17/2023      Assessment & Plan:   Problem List Items Addressed This Visit       Cardiovascular and Mediastinum   Essential hypertension    BP Readings from Last 1 Encounters:  02/17/23 (!) 144/86   Uncontrolled with Losartan 100 mg QD and amlodipine 10 mg QD Switched to amlodipine-olmesartan 10-40 mg QD to improve compliance and reduce pill burden Counseled for compliance with the medications Advised DASH diet  and moderate exercise/walking, at least 150 mins/week      Relevant Medications   amLODipine-olmesartan (AZOR) 10-40 MG tablet   PVD (peripheral vascular disease) (HCC)    On aspirin and statin Has claudication symptoms Has weak pulse on DPA Vascular surgery referral provided, but he denied. Check [place] ABI      Relevant Medications   amLODipine-olmesartan (AZOR) 10-40 MG tablet   Other Relevant Orders   US ARTERIAL ABI (SCREENING LOWER EXTREMITY)     Respiratory   Centrilobular emphysema (HCC)    Noted on CT chest  Has been trying to cut down smoking Asymptomatic currently        Endocrine   Type 2 diabetes mellitus (HCC) - Primary    Lab Results  Component Value Date   HGBA1C 6.2 (H) 09/02/2022   Well-controlled On glipizide, metformin and Jardiance Advised to follow diabetic diet On statin and ARB F/u CMP and lipid panel Diabetic eye exam: Advised to follow up with Ophthalmology for diabetic eye exam      Relevant Medications   amLODipine-olmesartan (AZOR) 10-40 MG tablet   Other Relevant Orders   CMP14+EGFR (Completed)   Hemoglobin A1c (Completed)   Urine Microalbumin w/creat. ratio (Completed)     Genitourinary   Stage 3a chronic kidney disease (HCC)    Last CMP showed GFR of 64, overall improved Check BMP  Checked urine microalbumin/creatinine ratio - On ARB and Jardiance Avoid nephrotoxic agents Advised to maintain adequate hydration -needs to cut down soft drinks intake        Other   Depression (Chronic)    Overall well controlled Lexapro was discontinued in the last visit as he was feeling better without it Prefers to take Valium On Wellbutrin for MDD and anxiety, symptoms improved If persistent anxiety, may add Valium - explained that Valium would not help depression        Meds ordered this encounter  Medications   amLODipine-olmesartan (AZOR) 10-40 MG tablet    Sig: Take 1 tablet by mouth daily.    Dispense:  30 tablet    Refill:  1     PLEASE DISCONTINUE LOSARTAN AND AMLODIPINE.    Follow-up: Return in about 6 weeks (around 03/31/2023) for HTN.    Anabel Halon, MD

## 2023-02-17 NOTE — Assessment & Plan Note (Signed)
Lab Results  Component Value Date   HGBA1C 6.2 (H) 09/02/2022   Well-controlled On glipizide, metformin and Jardiance Advised to follow diabetic diet On statin and ARB F/u CMP and lipid panel Diabetic eye exam: Advised to follow up with Ophthalmology for diabetic eye exam 

## 2023-02-17 NOTE — Patient Instructions (Signed)
Please start taking Olmesartan-Amlodipine once daily. Please stop taking separate Losartan and Amlodipine.  Please get [place] of leg done as scheduled.  Please try to cut down -> quit smoking.

## 2023-02-19 ENCOUNTER — Ambulatory Visit (HOSPITAL_COMMUNITY): Payer: Medicare HMO

## 2023-02-19 LAB — CMP14+EGFR
ALT: 27 IU/L (ref 0–44)
AST: 24 IU/L (ref 0–40)
Albumin: 4.3 g/dL (ref 3.8–4.8)
Alkaline Phosphatase: 125 IU/L — ABNORMAL HIGH (ref 44–121)
BUN/Creatinine Ratio: 16 (ref 10–24)
BUN: 20 mg/dL (ref 8–27)
Bilirubin Total: 0.8 mg/dL (ref 0.0–1.2)
CO2: 17 mmol/L — ABNORMAL LOW (ref 20–29)
Calcium: 9.5 mg/dL (ref 8.6–10.2)
Chloride: 107 mmol/L — ABNORMAL HIGH (ref 96–106)
Creatinine, Ser: 1.26 mg/dL (ref 0.76–1.27)
Globulin, Total: 2.6 g/dL (ref 1.5–4.5)
Glucose: 101 mg/dL — ABNORMAL HIGH (ref 70–99)
Potassium: 4.2 mmol/L (ref 3.5–5.2)
Sodium: 141 mmol/L (ref 134–144)
Total Protein: 6.9 g/dL (ref 6.0–8.5)
eGFR: 59 mL/min/{1.73_m2} — ABNORMAL LOW (ref >59)

## 2023-02-19 LAB — MICROALBUMIN / CREATININE URINE RATIO
Creatinine, Urine: 62 mg/dL
Microalb/Creat Ratio: 390 mg/g{creat} — ABNORMAL HIGH (ref 0–29)
Microalbumin, Urine: 241.9 ug/mL

## 2023-02-19 LAB — HEMOGLOBIN A1C
Est. average glucose Bld gHb Est-mCnc: 134 mg/dL
Hgb A1c MFr Bld: 6.3 % — ABNORMAL HIGH (ref 4.8–5.6)

## 2023-02-21 NOTE — Assessment & Plan Note (Addendum)
BP Readings from Last 1 Encounters:  02/17/23 (!) 144/86   Uncontrolled with Losartan 100 mg QD and amlodipine 10 mg QD Switched to amlodipine-olmesartan 10-40 mg QD to improve compliance and reduce pill burden Counseled for compliance with the medications Advised DASH diet and moderate exercise/walking, at least 150 mins/week

## 2023-02-21 NOTE — Assessment & Plan Note (Addendum)
Last CMP showed GFR of 64, overall improved Check BMP  Checked urine microalbumin/creatinine ratio - On ARB and Jardiance Avoid nephrotoxic agents Advised to maintain adequate hydration -needs to cut down soft drinks intake

## 2023-02-21 NOTE — Assessment & Plan Note (Signed)
Overall well controlled Lexapro was discontinued in the last visit as he was feeling better without it Prefers to take Valium On Wellbutrin for MDD and anxiety, symptoms improved If persistent anxiety, may add Valium - explained that Valium would not help depression

## 2023-02-26 ENCOUNTER — Ambulatory Visit (HOSPITAL_COMMUNITY): Payer: Medicare HMO

## 2023-03-05 ENCOUNTER — Ambulatory Visit (HOSPITAL_COMMUNITY): Payer: Medicare HMO

## 2023-03-13 ENCOUNTER — Ambulatory Visit (HOSPITAL_COMMUNITY)
Admission: RE | Admit: 2023-03-13 | Discharge: 2023-03-13 | Disposition: A | Discharge status: Home / Self Care | Payer: Medicare HMO | Source: Ambulatory Visit | Attending: Internal Medicine | Admitting: Internal Medicine

## 2023-03-13 DIAGNOSIS — I739 Peripheral vascular disease, unspecified: Secondary | ICD-10-CM | POA: Diagnosis not present

## 2023-03-14 ENCOUNTER — Other Ambulatory Visit: Payer: Self-pay

## 2023-03-14 DIAGNOSIS — I739 Peripheral vascular disease, unspecified: Secondary | ICD-10-CM

## 2023-03-27 ENCOUNTER — Other Ambulatory Visit: Payer: Self-pay

## 2023-03-27 ENCOUNTER — Telehealth: Payer: Self-pay | Admitting: Internal Medicine

## 2023-03-27 NOTE — Telephone Encounter (Signed)
Patient called in LVM in regard to letter received in the mail for results.  Patient wants a call back to help explain what findings mean

## 2023-03-27 NOTE — Telephone Encounter (Signed)
Spoke to patient

## 2023-03-31 ENCOUNTER — Ambulatory Visit (INDEPENDENT_AMBULATORY_CARE_PROVIDER_SITE_OTHER): Payer: Medicare HMO | Admitting: Internal Medicine

## 2023-03-31 ENCOUNTER — Other Ambulatory Visit: Payer: Self-pay | Admitting: *Deleted

## 2023-03-31 ENCOUNTER — Telehealth: Payer: Self-pay | Admitting: Internal Medicine

## 2023-03-31 ENCOUNTER — Other Ambulatory Visit: Payer: Self-pay | Admitting: Internal Medicine

## 2023-03-31 ENCOUNTER — Encounter: Payer: Self-pay | Admitting: Internal Medicine

## 2023-03-31 VITALS — BP 113/75 | HR 76 | Ht 70.0 in | Wt 223.6 lb

## 2023-03-31 DIAGNOSIS — J432 Centrilobular emphysema: Secondary | ICD-10-CM

## 2023-03-31 DIAGNOSIS — E1159 Type 2 diabetes mellitus with other circulatory complications: Secondary | ICD-10-CM | POA: Diagnosis not present

## 2023-03-31 DIAGNOSIS — E1169 Type 2 diabetes mellitus with other specified complication: Secondary | ICD-10-CM

## 2023-03-31 DIAGNOSIS — Z7984 Long term (current) use of oral hypoglycemic drugs: Secondary | ICD-10-CM

## 2023-03-31 DIAGNOSIS — E782 Mixed hyperlipidemia: Secondary | ICD-10-CM

## 2023-03-31 DIAGNOSIS — N1831 Chronic kidney disease, stage 3a: Secondary | ICD-10-CM | POA: Diagnosis not present

## 2023-03-31 DIAGNOSIS — I739 Peripheral vascular disease, unspecified: Secondary | ICD-10-CM | POA: Diagnosis not present

## 2023-03-31 DIAGNOSIS — I10 Essential (primary) hypertension: Secondary | ICD-10-CM | POA: Diagnosis not present

## 2023-03-31 NOTE — Assessment & Plan Note (Signed)
Lab Results  Component Value Date   HGBA1C 6.3 (H) 02/17/2023   Well-controlled On glipizide, metformin and Jardiance Advised to follow diabetic diet On statin and ARB F/u CMP and lipid panel Diabetic eye exam: Advised to follow up with Ophthalmology for diabetic eye exam

## 2023-03-31 NOTE — Telephone Encounter (Signed)
Patient advised.

## 2023-03-31 NOTE — Progress Notes (Signed)
Established Patient Office Visit  Subjective:  Patient ID: Alex Marks, male    DOB: 09/02/1945  Age: 77 y.o. MRN: [id number]  CC:  Chief Complaint  Patient presents with   Hypertension    Six week follow up    PAD    HPI Alex Marks is a 77 y.o. male with past medical history of HTN, PVD, hep C, type II DM, HLD and depression who presents for f/u of his chronic medical conditions.  He complains of bilateral leg weakness and pain upon walking.  He also has gait disturbance.  Denies any numbness or tingling currently.  He has weak DPA pulse. He had [place] ABI done, which showed bilateral mild to moderate PAD. He agrees to see vascular surgeon now.  He is currently taking aspirin and statin.  He has also started taking fish oil and states that it has been helping with his leg weakness (?).  HTN: BP is well-controlled. Takes amlodipine-olmesartan 10-40 mg QD regularly. Patient denies headache, dizziness, chest pain, dyspnea or palpitations.  Type II DM: He takes metformin, glipizide and Jardiance for it.  His last Hgb A1c was 6.3. He does not check blood glucose at home. He denies any fatigue, polyuria or polydipsia.    CKD: Last CMP showed GFR of 64, significantly improved.  Denies any dysuria, hematuria, urinary hesitance or resistance.  MDD: He had anhedonia, anxiety/agitation and feeling lonely.  He has started Wellbutrin since the last visit, and reports that it is helping him. He has tried multiple antidepressants in the past, but details are unknown.  Amongst them, he has tried Lexapro according to chart review.  He reports that he had better response with Valium in the past.  He currently lives alone and does not have much support from family or friends.    Past Medical History:  Diagnosis Date   Anxiety    COPD (chronic obstructive pulmonary disease) (HCC)    Dark stools 07/08/2019   Depression    Depression    Frequency of urination    GAD (generalized anxiety disorder)     GERD (gastroesophageal reflux disease)    Hepatitis C    s/p treatment with Dr. Dulce Sellar in 2017. HCV RNA not detected in April 2021   Hypercholesterolemia    Hypertension    PAD (peripheral artery disease) (HCC)    Scalp lesion 04/12/2019   Substance abuse (HCC)    Transfusion of blood product refused for religious reason    Type II diabetes mellitus [facility])     Past Surgical History:  Procedure Laterality Date   ABDOMINAL AORTOGRAM W/LOWER EXTREMITY Bilateral 05/30/2017   ABDOMINAL AORTOGRAM W/LOWER EXTREMITY N/A 05/30/2017   Procedure: ABDOMINAL AORTOGRAM W/LOWER EXTREMITY;  Surgeon: Sherren Kerns, MD;  Location: [facility];  Service: Cardiovascular;  Laterality: N/A;  Bilateral    Family History  Problem Relation Age of Onset   Alzheimer's disease Mother    Mental illness Mother        Depression   Breast cancer Mother    Cancer Father        bladder?   Hypertension Father    Alcohol abuse Father    Heart disease Sister    Hearing loss Sister        valve   Alcohol abuse Brother    Diabetes Paternal Aunt    COPD Sister    Alcohol abuse Brother    Alcohol abuse Brother    Alcohol abuse Brother  Early death Brother        MVA   Cancer Maternal Grandmother    Colon cancer Neg Hx    Liver cancer Neg Hx     Social History   Socioeconomic History   Marital status: Single    Spouse name: Not on file   Number of children: 0   Years of education: 11   Highest education level: Not on file  Occupational History   Occupation: retired    Comment: labor  Tobacco Use   Smoking status: Former    Current packs/day: 0.00    Average packs/day: 1 pack/day for 60.0 years (60.0 ttl pk-yrs)    Types: Cigarettes    Start date: 07/01/1961    Quit date: 01/29/2021    Years since quitting: 2.1   Smokeless tobacco: Never   Tobacco comments:    Has tried to quit, but his depressed mood makes it challenging to quit  Vaping Use   Vaping status: Never Used  Substance  and Sexual Activity   Alcohol use: Not Currently    Comment: history of heavy alcohol use; last drank alcohol about 3 years ago   Drug use: Not Currently    Types: Marijuana, Cocaine, Heroin, "Crack" cocaine    Comment: Last used heroin in his 72s. Last used cocaine and marijuana about 3 years ago.    Sexual activity: Not Currently  Other Topics Concern   Not on file  Social History Narrative   Single.Live in apartment home   Never had license   Likes to read   Social Determinants of Health   Financial Resource Strain: Low Risk  (07/24/2022)   Overall Financial Resource Strain (CARDIA)    Difficulty of Paying Living Expenses: Not hard at all  Food Insecurity: No Food Insecurity (07/24/2022)   Hunger Vital Sign    Worried About Running Out of Food in the Last Year: Never true    Ran Out of Food in the Last Year: Never true  Transportation Needs: Unmet Transportation Needs (07/24/2022)   PRAPARE - Administrator, Civil Service (Medical): Yes    Lack of Transportation (Non-Medical): Yes  Physical Activity: Inactive (07/24/2022)   Exercise Vital Sign    Days of Exercise per Week: 0 days    Minutes of Exercise per Session: 0 min  Stress: No Stress Concern Present (07/24/2022)   [employer] of Occupational Health - Occupational Stress Questionnaire    Feeling of Stress : Not at all  Social Connections: Moderately Isolated (07/24/2022)   Social Connection and Isolation Panel [NHANES]    Frequency of Communication with Friends and Family: Never    Frequency of Social Gatherings with Friends and Family: Never    Attends Religious Services: More than 4 times per year    Active Member of [employer] or Organizations: Yes    Attends Banker Meetings: More than 4 times per year    Marital Status: Never married  Intimate Partner Violence: Not At Risk (07/24/2022)   Humiliation, Afraid, Rape, and Kick questionnaire    Fear of Current or Ex-Partner: No    Emotionally  Abused: No    Physically Abused: No    Sexually Abused: No    Outpatient Medications Prior to Visit  Medication Sig Dispense Refill   amLODipine-olmesartan (AZOR) 10-40 MG tablet Take 1 tablet by mouth daily. 30 tablet 1   aspirin 81 MG chewable tablet Chew 1 tablet by mouth daily.     atorvastatin (LIPITOR)  10 MG tablet TAKE ONE TABLET BY MOUTH ONCE DAILY. 90 tablet 0   blood glucose meter kit and supplies Dispense based on patient and insurance preference. Use up to four times daily as directed. (FOR ICD-10 E10.9, E11.9). 1 each 0   buPROPion (WELLBUTRIN XL) 150 MG 24 hr tablet TAKE 1 TABLET(150 MG) BY MOUTH DAILY 30 tablet 3   Carboxymethylcell-Glycerin PF (REFRESH OPTIVE PF) 0.5-0.9 % SOLN Place 2 drops into both eyes daily as needed (Irritation).     Cholecalciferol (VITAMIN D3) 25 MCG (1000 UT) CAPS Take 1 capsule (1,000 Units total) by mouth in the morning and at bedtime. 60 capsule 0   fexofenadine (ALLEGRA) 180 MG tablet Take 180 mg by mouth daily.     glipiZIDE (GLUCOTROL) 5 MG tablet Take 1 tablet (5 mg total) by mouth 2 (two) times daily before a meal. 180 tablet 1   JARDIANCE 10 MG TABS tablet TAKE 1 TABLET(10 MG) BY MOUTH DAILY BEFORE BREAKFAST 30 tablet 0   metFORMIN (GLUCOPHAGE) 500 MG tablet Take 1 tablet (500 mg total) by mouth daily with breakfast. 180 tablet 1   Multiple Vitamin (MULTIVITAMIN WITH MINERALS) TABS tablet Take 1 tablet by mouth daily. Senior Centrum     Omega-3 Fatty Acids (FISH OIL PEARLS) 300 MG CAPS Take 600 mg by mouth 2 (two) times daily. (Patient taking differently: Take 1,000 mg by mouth at bedtime.) 60 capsule 3   oxymetazoline (NASAL SPRAY MOISTURIZING 12 HR) 0.05 % nasal spray Place 1 spray into both nostrils daily as needed for congestion.     vitamin C (ASCORBIC ACID) 500 MG tablet Take 500 mg by mouth at bedtime.     No facility-administered medications prior to visit.    Allergies  Allergen Reactions   Onion     Bags under eyes, indigestion     Other Swelling    Hot sauce -- causes eye swelling Spicy food    ROS Review of Systems  Constitutional:  Negative for chills and fever.  HENT:  Negative for congestion and sore throat.   Eyes:  Negative for pain and discharge.  Respiratory:  Negative for cough and shortness of breath.   Cardiovascular:  Negative for chest pain and palpitations.  Gastrointestinal:  Negative for diarrhea, nausea and vomiting.  Endocrine: Negative for polydipsia and polyuria.  Genitourinary:  Negative for dysuria and hematuria.  Musculoskeletal:  Positive for arthralgias, gait problem and myalgias. Negative for neck pain and neck stiffness.  Skin:  Negative for rash.  Neurological:  Negative for dizziness, weakness, numbness and headaches.  Psychiatric/Behavioral:  Positive for sleep disturbance. Negative for agitation, behavioral problems and suicidal ideas. The patient is nervous/anxious.       Objective:    Physical Exam Vitals reviewed.  Constitutional:      General: He is not in acute distress.    Appearance: He is not diaphoretic.  HENT:     Head: Normocephalic and atraumatic.     Nose: Nose normal.     Mouth/Throat:     Mouth: Mucous membranes are moist.  Eyes:     General: No scleral icterus.    Extraocular Movements: Extraocular movements intact.  Cardiovascular:     Rate and Rhythm: Normal rate and regular rhythm.     Heart sounds: Normal heart sounds. No murmur heard. Pulmonary:     Breath sounds: Normal breath sounds. No wheezing or rales.  Musculoskeletal:     Cervical back: Neck supple. No tenderness.  Right lower leg: No edema.     Left lower leg: No edema.  Skin:    General: Skin is warm.     Findings: No rash.  Neurological:     General: No focal deficit present.     Mental Status: He is alert and oriented to person, place, and time. Mental status is at baseline.     Cranial Nerves: No cranial nerve deficit.     Sensory: No sensory deficit.     Motor:  Weakness (4/5 in b/l LE) present.  Psychiatric:        Mood and Affect: Mood normal.        Behavior: Behavior normal.     BP 113/75 (BP Location: Right Arm, Patient Position: Sitting, Cuff Size: Large)   Pulse 76   Ht 5\' 10"  (1.778 m)   Wt 223 lb 9.6 oz (101.4 kg)   SpO2 95%   BMI 32.08 kg/m  Wt Readings from Last 3 Encounters:  03/31/23 223 lb 9.6 oz (101.4 kg)  02/17/23 225 lb 12.8 oz (102.4 kg)  10/16/22 221 lb (100.2 kg)    Lab Results  Component Value Date   TSH 2.630 09/02/2022   Lab Results  Component Value Date   WBC 11.8 (H) 09/02/2022   HGB 16.0 09/02/2022   HCT 47.4 09/02/2022   MCV 88 09/02/2022   PLT 254 09/02/2022   Lab Results  Component Value Date   NA 141 02/17/2023   K 4.2 02/17/2023   CO2 17 (L) 02/17/2023   GLUCOSE 101 (H) 02/17/2023   BUN 20 02/17/2023   CREATININE 1.26 02/17/2023   BILITOT 0.8 02/17/2023   ALKPHOS 125 (H) 02/17/2023   AST 24 02/17/2023   ALT 27 02/17/2023   PROT 6.9 02/17/2023   ALBUMIN 4.3 02/17/2023   CALCIUM 9.5 02/17/2023   ANIONGAP 11 11/16/2019   EGFR 59 (L) 02/17/2023   Lab Results  Component Value Date   CHOL 124 09/02/2022   Lab Results  Component Value Date   HDL 35 (L) 09/02/2022   Lab Results  Component Value Date   LDLCALC 72 09/02/2022   Lab Results  Component Value Date   TRIG 90 09/02/2022   Lab Results  Component Value Date   CHOLHDL 3.5 09/02/2022   Lab Results  Component Value Date   HGBA1C 6.3 (H) 02/17/2023      Assessment & Plan:   Problem List Items Addressed This Visit       Cardiovascular and Mediastinum   Essential hypertension - Primary    BP Readings from Last 1 Encounters:  03/31/23 113/75   Well-controlled with amlodipine-olmesartan 10-40 mg QD now Counseled for compliance with the medications Advised DASH diet and moderate exercise/walking, at least 150 mins/week      PAD (peripheral artery disease) (HCC)    On aspirin and statin Has claudication  symptoms Has weak pulse on DPA Checked [place] ABI Vascular surgery referral provided        Respiratory   Centrilobular emphysema (HCC)    Noted on CT chest Has been trying to cut down smoking Asymptomatic currently        Endocrine   Type 2 diabetes mellitus (HCC)    Lab Results  Component Value Date   HGBA1C 6.3 (H) 02/17/2023   Well-controlled On glipizide, metformin and Jardiance Advised to follow diabetic diet On statin and ARB F/u CMP and lipid panel Diabetic eye exam: Advised to follow up with Ophthalmology for diabetic eye exam  Genitourinary   Stage 3a chronic kidney disease (HCC)    Last CMP showed GFR of 59, overall stable Checked urine microalbumin/creatinine ratio - On ARB and Jardiance Avoid nephrotoxic agents Advised to maintain adequate hydration -needs to cut down soft drinks intake        No orders of the defined types were placed in this encounter.   Follow-up: Return in about 3 months (around 06/30/2023) for HTN.    Anabel Halon, MD

## 2023-03-31 NOTE — Patient Instructions (Signed)
Please continue to take medications as prescribed.  Please continue to follow low salt diet and perform moderate exercise/walking as tolerated.  Please cut down -> quit smoking soon.

## 2023-03-31 NOTE — Assessment & Plan Note (Signed)
Noted on CT chest Has been trying to cut down smoking Asymptomatic currently 

## 2023-03-31 NOTE — Assessment & Plan Note (Addendum)
BP Readings from Last 1 Encounters:  03/31/23 113/75   Well-controlled with amlodipine-olmesartan 10-40 mg QD now Counseled for compliance with the medications Advised DASH diet and moderate exercise/walking, at least 150 mins/week

## 2023-03-31 NOTE — Telephone Encounter (Signed)
Patient calling says Dr Allena Katz asked for the name of one of the medications pt was taking- says its amLODipine-olmesartan (AZOR) 10-40 MG tablet [[id number]]  Thank you

## 2023-03-31 NOTE — Assessment & Plan Note (Signed)
Last CMP showed GFR of 59, overall stable Checked urine microalbumin/creatinine ratio - On ARB and Jardiance Avoid nephrotoxic agents Advised to maintain adequate hydration -needs to cut down soft drinks intake

## 2023-03-31 NOTE — Assessment & Plan Note (Signed)
On aspirin and statin Has claudication symptoms Has weak pulse on DPA Checked [place] ABI Vascular surgery referral provided

## 2023-04-07 ENCOUNTER — Other Ambulatory Visit: Payer: Self-pay | Admitting: Internal Medicine

## 2023-04-07 NOTE — Progress Notes (Unsigned)
Office Note     CC:  Leg weakness and pain with ambulation Requesting Provider:  Anabel Halon, MD  HPI: Alex Marks is a 77 y.o. ([birth year]) male presenting at the request of .Anabel Halon, MD for leg weakness and pain with ambulation.  Alex Marks was seen in our office over 5 years ago with angiography demonstrating bilateral severe SFA pop disease with three-vessel runoff.  This was performed by my partner, Dr. Darrick Penna in 2018.  On exam today, Alex Marks was doing well.  A native of [facility], he moved to [place] to get away from bad influences.  He has stents stopped drinking and doing drugs.  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  He continues to live independently.  Over the last several months, nor has appreciated bilateral lower extremity claudication with symptoms arising after roughly 2-3 blocks.  This does not affect his ability to live independently.  He can grocery shop without issue.  He denies ischemic rest pain, tissue loss in the feet.  Alex Marks carries a cane, however this is to defend himself from dogs when walking, and not because he needs an assist device.   The pt is  on a statin for cholesterol management.  The pt is  on a daily aspirin.   Other AC:  - The pt is on medication for hypertension.   The pt is  diabetic.  Tobacco hx:  1PPD  Past Medical History:  Diagnosis Date   Anxiety    COPD (chronic obstructive pulmonary disease) (HCC)    Dark stools 07/08/2019   Depression    Depression    Frequency of urination    GAD (generalized anxiety disorder)    GERD (gastroesophageal reflux disease)    Hepatitis C    s/p treatment with Dr. Dulce Sellar in 2017. HCV RNA not detected in April 2021   Hypercholesterolemia    Hypertension    PAD (peripheral artery disease) (HCC)    Scalp lesion 04/12/2019   Substance abuse (HCC)    Transfusion of blood product refused for religious reason    Type II diabetes mellitus [facility])     Past  Surgical History:  Procedure Laterality Date   ABDOMINAL AORTOGRAM W/LOWER EXTREMITY Bilateral 05/30/2017   ABDOMINAL AORTOGRAM W/LOWER EXTREMITY N/A 05/30/2017   Procedure: ABDOMINAL AORTOGRAM W/LOWER EXTREMITY;  Surgeon: Sherren Kerns, MD;  Location: [facility];  Service: Cardiovascular;  Laterality: N/A;  Bilateral    Social History   Socioeconomic History   Marital status: Single    Spouse name: Not on file   Number of children: 0   Years of education: 11   Highest education level: Not on file  Occupational History   Occupation: retired    Comment: labor  Tobacco Use   Smoking status: Former    Current packs/day: 0.00    Average packs/day: 1 pack/day for 60.0 years (60.0 ttl pk-yrs)    Types: Cigarettes    Start date: 07/01/1961    Quit date: 01/29/2021    Years since quitting: 2.1   Smokeless tobacco: Never   Tobacco comments:    Has tried to quit, but his depressed mood makes it challenging to quit  Vaping Use   Vaping status: Never Used  Substance and Sexual Activity   Alcohol use: Not Currently    Comment: history of heavy alcohol use; last drank alcohol about 3 years ago   Drug use: Not Currently    Types: Marijuana, Cocaine,  Heroin, "Crack" cocaine    Comment: Last used heroin in his 57s. Last used cocaine and marijuana about 3 years ago.    Sexual activity: Not Currently  Other Topics Concern   Not on file  Social History Narrative   Single.Live in apartment home   Never had license   Likes to read   Social Determinants of Health   Financial Resource Strain: Low Risk  (07/24/2022)   Overall Financial Resource Strain (CARDIA)    Difficulty of Paying Living Expenses: Not hard at all  Food Insecurity: No Food Insecurity (07/24/2022)   Hunger Vital Sign    Worried About Running Out of Food in the Last Year: Never true    Ran Out of Food in the Last Year: Never true  Transportation Needs: Unmet Transportation Needs (07/24/2022)   PRAPARE -  Administrator, Civil Service (Medical): Yes    Lack of Transportation (Non-Medical): Yes  Physical Activity: Inactive (07/24/2022)   Exercise Vital Sign    Days of Exercise per Week: 0 days    Minutes of Exercise per Session: 0 min  Stress: No Stress Concern Present (07/24/2022)   [employer] of Occupational Health - Occupational Stress Questionnaire    Feeling of Stress : Not at all  Social Connections: Moderately Isolated (07/24/2022)   Social Connection and Isolation Panel [NHANES]    Frequency of Communication with Friends and Family: Never    Frequency of Social Gatherings with Friends and Family: Never    Attends Religious Services: More than 4 times per year    Active Member of [employer] or Organizations: Yes    Attends Banker Meetings: More than 4 times per year    Marital Status: Never married  Intimate Partner Violence: Not At Risk (07/24/2022)   Humiliation, Afraid, Rape, and Kick questionnaire    Fear of Current or Ex-Partner: No    Emotionally Abused: No    Physically Abused: No    Sexually Abused: No   Family History  Problem Relation Age of Onset   Alzheimer's disease Mother    Mental illness Mother        Depression   Breast cancer Mother    Cancer Father        bladder?   Hypertension Father    Alcohol abuse Father    Heart disease Sister    Hearing loss Sister        valve   Alcohol abuse Brother    Diabetes Paternal Aunt    COPD Sister    Alcohol abuse Brother    Alcohol abuse Brother    Alcohol abuse Brother    Early death Brother        MVA   Cancer Maternal Grandmother    Colon cancer Neg Hx    Liver cancer Neg Hx     Current Outpatient Medications  Medication Sig Dispense Refill   amLODipine-benazepril (LOTREL) 10-40 MG capsule TAKE ONE TABLET BY MOUTH EVERY DAY 30 capsule 1   amLODipine-olmesartan (AZOR) 10-40 MG tablet Take 1 tablet by mouth daily. 30 tablet 1   aspirin 81 MG chewable tablet Chew 1 tablet by mouth  daily.     atorvastatin (LIPITOR) 10 MG tablet TAKE ONE TABLET BY MOUTH ONCE DAILY. 90 tablet 0   blood glucose meter kit and supplies Dispense based on patient and insurance preference. Use up to four times daily as directed. (FOR ICD-10 E10.9, E11.9). 1 each 0   buPROPion (WELLBUTRIN XL) 150  MG 24 hr tablet TAKE 1 TABLET(150 MG) BY MOUTH DAILY 30 tablet 3   Carboxymethylcell-Glycerin PF (REFRESH OPTIVE PF) 0.5-0.9 % SOLN Place 2 drops into both eyes daily as needed (Irritation).     Cholecalciferol (VITAMIN D3) 25 MCG (1000 UT) CAPS Take 1 capsule (1,000 Units total) by mouth in the morning and at bedtime. 60 capsule 0   fexofenadine (ALLEGRA) 180 MG tablet Take 180 mg by mouth daily.     glipiZIDE (GLUCOTROL) 5 MG tablet Take 1 tablet (5 mg total) by mouth 2 (two) times daily before a meal. 180 tablet 1   JARDIANCE 10 MG TABS tablet TAKE 1 TABLET(10 MG) BY MOUTH DAILY BEFORE BREAKFAST 30 tablet 0   metFORMIN (GLUCOPHAGE) 500 MG tablet Take 1 tablet (500 mg total) by mouth daily with breakfast. 180 tablet 1   Multiple Vitamin (MULTIVITAMIN WITH MINERALS) TABS tablet Take 1 tablet by mouth daily. [facility]     Omega-3 Fatty Acids (FISH OIL PEARLS) 300 MG CAPS Take 600 mg by mouth 2 (two) times daily. (Patient taking differently: Take 1,000 mg by mouth at bedtime.) 60 capsule 3   oxymetazoline (NASAL SPRAY MOISTURIZING 12 HR) 0.05 % nasal spray Place 1 spray into both nostrils daily as needed for congestion.     vitamin C (ASCORBIC ACID) 500 MG tablet Take 500 mg by mouth at bedtime.     No current facility-administered medications for this visit.    Allergies  Allergen Reactions   Onion     Bags under eyes, indigestion    Other Swelling    Hot sauce -- causes eye swelling Spicy food     REVIEW OF SYSTEMS:   [X]  denotes positive finding, [ ]  denotes negative finding Cardiac  Comments:  Chest pain or chest pressure:    Shortness of breath upon exertion:    Short of breath when  lying flat:    Irregular heart rhythm:        Vascular    Pain in calf, thigh, or hip brought on by ambulation: X   Pain in feet at night that wakes you up from your sleep:     Blood clot in your veins:    Leg swelling:         Pulmonary    Oxygen at home:    Productive cough:     Wheezing:         Neurologic    Sudden weakness in arms or legs:     Sudden numbness in arms or legs:     Sudden onset of difficulty speaking or slurred speech:    Temporary loss of vision in one eye:     Problems with dizziness:         Gastrointestinal    Blood in stool:     Vomited blood:         Genitourinary    Burning when urinating:     Blood in urine:        Psychiatric    Major depression:         Hematologic    Bleeding problems:    Problems with blood clotting too easily:        Skin    Rashes or ulcers:        Constitutional    Fever or chills:      PHYSICAL EXAMINATION:  There were no vitals filed for this visit.  General:  WDWN in NAD; vital signs documented above Gait: Not observed  HENT: WNL, normocephalic Pulmonary: normal non-labored breathing , without wheezing Cardiac: regular HR Abdomen: soft, NT, no masses Skin: without rashes Vascular Exam/Pulses:  Right Left  Radial 2+ (normal) 2+ (normal)  Ulnar    Femoral 2+ (normal) 2+ (normal)  Popliteal    DP absent absent       Extremities: without ischemic changes, without Gangrene , without cellulitis; without open wounds;  Musculoskeletal: no muscle wasting or atrophy  Neurologic: A&O X 3;  No focal weakness or paresthesias are detected Psychiatric:  The pt has Normal affect.   Non-Invasive Vascular Imaging:    ABI Findings:  +---------+------------------+-----+----------+--------+  Right   Rt Pressure (mmHg)IndexWaveform  Comment   +---------+------------------+-----+----------+--------+  Brachial 166                                         +---------+------------------+-----+----------+--------+  PTA     119               0.72 monophasic          +---------+------------------+-----+----------+--------+  DP      102               0.61 monophasic          +---------+------------------+-----+----------+--------+  Great Toe48                0.29                     +---------+------------------+-----+----------+--------+   +---------+------------------+-----+----------+-------+  Left    Lt Pressure (mmHg)IndexWaveform  Comment  +---------+------------------+-----+----------+-------+  Brachial 165                                       +---------+------------------+-----+----------+-------+  PTA     146               0.88 monophasic         +---------+------------------+-----+----------+-------+  DP      123               0.74 monophasic         +---------+------------------+-----+----------+-------+  Great Toe45                0.27                    +---------+------------------+-----+----------+-------+   +-------+-----------+-----------+------------+------------+  ABI/TBIToday's ABIToday's TBIPrevious ABIPrevious TBI  +-------+-----------+-----------+------------+------------+  Right 0.72       0.29                                 +-------+-----------+-----------+------------+------------+  Left  0.88       0.27                                 +-------+-----------+-----------+------------+------------+     ASSESSMENT/PLAN: Alex Marks is a 77 y.o. male presenting with moderate bilateral lower extremity claudication.  I had a long conversation with Nikunj regarding the above.  Prior angiography demonstrated severe femoral-popliteal disease, which is likely progressed since 2018.   ABIs were evaluated demonstrating monophasic waveforms with depressed toe pressures. My guess is that both superficial femoral arteries are occluded at this point.  Danan is aware that continued smoking will increase his odds of needing an intervention down the road.  At this time, he would be best served with continued medical management and a walking program.  We also discussed the importance of smoking cessation, or at least cutting back, which she is interested in doing.  I asked that he continue his current medication regimen.    My plan is to send the above to his primary care, Dr. Allena Katz to discuss possible increase in statin therapy to high intensity.  I have also started him on Pletal in an effort to improve his claudication symptoms - He has no history of heart failure.  My plan is to see him back in the office in 6 months.  He was asked to call should his claudication symptoms worsen or should he develop ischemic rest pain or tissue loss.   Victorino Sparrow, MD [facility] [phone] Total time of patient care including pre-visit research, consultation, and documentation greater than 45 minutes

## 2023-04-10 ENCOUNTER — Other Ambulatory Visit: Payer: Self-pay | Admitting: Internal Medicine

## 2023-04-10 ENCOUNTER — Ambulatory Visit (HOSPITAL_COMMUNITY)
Admission: RE | Admit: 2023-04-10 | Discharge: 2023-04-10 | Disposition: A | Discharge status: Home / Self Care | Payer: Medicare HMO | Source: Ambulatory Visit | Attending: Vascular Surgery | Admitting: Vascular Surgery

## 2023-04-10 ENCOUNTER — Encounter: Payer: Self-pay | Admitting: Vascular Surgery

## 2023-04-10 ENCOUNTER — Ambulatory Visit (INDEPENDENT_AMBULATORY_CARE_PROVIDER_SITE_OTHER): Payer: Medicare HMO | Admitting: Vascular Surgery

## 2023-04-10 VITALS — BP 146/83 | HR 55 | Temp 98.0°F | Ht 70.0 in | Wt 223.0 lb

## 2023-04-10 DIAGNOSIS — I70213 Atherosclerosis of native arteries of extremities with intermittent claudication, bilateral legs: Secondary | ICD-10-CM | POA: Diagnosis not present

## 2023-04-10 DIAGNOSIS — I10 Essential (primary) hypertension: Secondary | ICD-10-CM

## 2023-04-10 DIAGNOSIS — I739 Peripheral vascular disease, unspecified: Secondary | ICD-10-CM | POA: Diagnosis not present

## 2023-04-10 MED ORDER — CILOSTAZOL 100 MG PO TABS: 100.0000 mg | ORAL_TABLET | Freq: Two times a day (BID) | ORAL | 11 refills | Status: DC

## 2023-04-10 MED ORDER — AMLODIPINE-OLMESARTAN 10-40 MG PO TABS
1.0000 | ORAL_TABLET | Freq: Every day | ORAL | 5 refills | Status: DC
Start: 2023-04-10 — End: 2023-07-28

## 2023-04-10 NOTE — Telephone Encounter (Signed)
Spoke to patient

## 2023-04-10 NOTE — Telephone Encounter (Signed)
Patient is calling says he is not sure which Amlodipine he is supposed to take- also wanting to speak about new medication his vein MD put him on. Please advise Thank you

## 2023-04-14 LAB — VAS US ABI WITH/WO TBI
Left ABI: 0.88
Right ABI: 0.72

## 2023-04-20 ENCOUNTER — Other Ambulatory Visit: Payer: Self-pay | Admitting: Internal Medicine

## 2023-04-20 DIAGNOSIS — F331 Major depressive disorder, recurrent, moderate: Secondary | ICD-10-CM

## 2023-04-28 ENCOUNTER — Telehealth: Payer: Self-pay | Admitting: Internal Medicine

## 2023-04-28 NOTE — Telephone Encounter (Signed)
Patient called in requesting call back .  Has ordered Better Bladder medication as seen on tv   Wants to know if this medication is okay to take with already prescribed mediations

## 2023-04-28 NOTE — Telephone Encounter (Signed)
Spoke to patient

## 2023-05-02 ENCOUNTER — Other Ambulatory Visit: Payer: Self-pay | Admitting: Internal Medicine

## 2023-05-02 DIAGNOSIS — E1169 Type 2 diabetes mellitus with other specified complication: Secondary | ICD-10-CM

## 2023-05-08 ENCOUNTER — Other Ambulatory Visit: Payer: Self-pay

## 2023-05-08 DIAGNOSIS — I739 Peripheral vascular disease, unspecified: Secondary | ICD-10-CM

## 2023-05-30 ENCOUNTER — Ambulatory Visit
Admission: EM | Admit: 2023-05-30 | Discharge: 2023-05-30 | Disposition: A | Discharge status: Home / Self Care | Payer: Medicare HMO | Attending: Family Medicine | Admitting: Family Medicine

## 2023-05-30 DIAGNOSIS — H00015 Hordeolum externum left lower eyelid: Secondary | ICD-10-CM

## 2023-05-30 DIAGNOSIS — H1013 Acute atopic conjunctivitis, bilateral: Secondary | ICD-10-CM | POA: Diagnosis not present

## 2023-05-30 MED ORDER — OLOPATADINE HCL 0.1 % OP SOLN: 1.0000 [drp] | Freq: Two times a day (BID) | OPHTHALMIC | 0 refills | Status: AC

## 2023-05-30 MED ORDER — ERYTHROMYCIN 5 MG/GM OP OINT: TOPICAL_OINTMENT | OPHTHALMIC | 0 refills | Status: DC

## 2023-05-30 NOTE — ED Provider Notes (Signed)
[facility]    CSN: [id number] Arrival date & time: 05/30/23  1646      History   Chief Complaint No chief complaint on file.   HPI Alex Marks is a 77 y.o. male.   Patient presenting today with intermittent waxing and waning bilateral eye redness, clear drainage, swelling to lower eyelids worse on the left.  Denies visual change, injury to the eye, fever, chills, nausea, vomiting, headaches.  Took an allergy pill last night and states this seems to have made things a bit better.      Past Medical History:  Diagnosis Date   Anxiety    COPD (chronic obstructive pulmonary disease) (HCC)    Dark stools 07/08/2019   Depression    Depression    Frequency of urination    GAD (generalized anxiety disorder)    GERD (gastroesophageal reflux disease)    Hepatitis C    s/p treatment with Dr. Dulce Sellar in 2017. HCV RNA not detected in April 2021   Hypercholesterolemia    Hypertension    PAD (peripheral artery disease) (HCC)    Scalp lesion 04/12/2019   Substance abuse (HCC)    Transfusion of blood product refused for religious reason    Type II diabetes mellitus [facility])     Patient Active Problem List   Diagnosis Date Noted   Centrilobular emphysema (HCC) 12/07/2021   Aortic atherosclerosis (HCC) 12/07/2021   Stage 3a chronic kidney disease (HCC) 12/07/2021   Encounter for general adult medical examination with abnormal findings 08/08/2021   Prostate cancer screening 08/08/2021   Onychomycosis 04/16/2021   Hepatic fibrosis 10/07/2019   Depression 09/02/2019   Type 2 diabetes mellitus (HCC) 07/08/2019   Encounter for screening for lung cancer 07/08/2019   Scalp lesion 04/12/2019   PAD (peripheral artery disease) (HCC) 05/30/2017   PVD (peripheral vascular disease) (HCC) 05/30/2017   History of hepatitis C 11/11/2016   Alcohol abuse 10/08/2016   Substance abuse (HCC) 10/08/2016   Tobacco abuse 10/08/2016   HLD (hyperlipidemia) 10/08/2016   Essential  hypertension 10/08/2016   ED (erectile dysfunction) 10/08/2016    Past Surgical History:  Procedure Laterality Date   ABDOMINAL AORTOGRAM W/LOWER EXTREMITY Bilateral 05/30/2017   ABDOMINAL AORTOGRAM W/LOWER EXTREMITY N/A 05/30/2017   Procedure: ABDOMINAL AORTOGRAM W/LOWER EXTREMITY;  Surgeon: Sherren Kerns, MD;  Location: [facility];  Service: Cardiovascular;  Laterality: N/A;  Bilateral       Home Medications    Prior to Admission medications   Medication Sig Start Date End Date Taking? Authorizing Provider  erythromycin ophthalmic ointment Place a 1/2 inch ribbon of ointment into the left lower eyelid BID prn. 05/30/23  Yes Particia Nearing, PA-C  olopatadine (PATADAY) 0.1 % ophthalmic solution Place 1 drop into both eyes 2 (two) times daily. 05/30/23  Yes Particia Nearing, PA-C  amLODipine-olmesartan (AZOR) 10-40 MG tablet Take 1 tablet by mouth daily. 04/10/23   Anabel Halon, MD  aspirin 81 MG chewable tablet Chew 1 tablet by mouth daily.    [provider]  atorvastatin (LIPITOR) 10 MG tablet TAKE ONE TABLET BY MOUTH ONCE DAILY. 03/31/23   Anabel Halon, MD  blood glucose meter kit and supplies Dispense based on patient and insurance preference. Use up to four times daily as directed. (FOR ICD-10 E10.9, E11.9). 12/07/21   Anabel Halon, MD  buPROPion (WELLBUTRIN XL) 150 MG 24 hr tablet TAKE ONE TABLET BY MOUTH EVERY DAY 04/21/23   Allena Katz, Dondra Prader  K, MD  Carboxymethylcell-Glycerin PF (REFRESH OPTIVE PF) 0.5-0.9 % SOLN Place 2 drops into both eyes daily as needed (Irritation).    [provider]  Cholecalciferol (VITAMIN D3) 25 MCG (1000 UT) CAPS Take 1 capsule (1,000 Units total) by mouth in the morning and at bedtime. 12/04/20   Elenore Paddy, NP  cilostazol (PLETAL) 100 MG tablet Take 1 tablet (100 mg total) by mouth 2 (two) times daily before a meal. 04/10/23   Victorino Sparrow, MD  fexofenadine (ALLEGRA) 180 MG tablet Take 180 mg by mouth  daily.    [provider]  glipiZIDE (GLUCOTROL) 5 MG tablet Take 1 tablet (5 mg total) by mouth 2 (two) times daily before a meal. 10/16/22   Patel, Earlie Lou, MD  JARDIANCE 10 MG TABS tablet TAKE 1 TABLET(10 MG) BY MOUTH DAILY BEFORE BREAKFAST 05/02/23   Anabel Halon, MD  metFORMIN (GLUCOPHAGE) 500 MG tablet Take 1 tablet (500 mg total) by mouth daily with breakfast. 10/16/22   Anabel Halon, MD  Multiple Vitamin (MULTIVITAMIN WITH MINERALS) TABS tablet Take 1 tablet by mouth daily. Tree surgeon, Historical, MD  Omega-3 Fatty Acids (FISH OIL PEARLS) 300 MG CAPS Take 600 mg by mouth 2 (two) times daily. Patient taking differently: Take 1,000 mg by mouth at bedtime. 07/15/19   Wilson Singer, MD  oxymetazoline (NASAL SPRAY MOISTURIZING 12 HR) 0.05 % nasal spray Place 1 spray into both nostrils daily as needed for congestion.    [provider]  vitamin C (ASCORBIC ACID) 500 MG tablet Take 500 mg by mouth at bedtime.    [provider]    Family History Family History  Problem Relation Age of Onset   Alzheimer's disease Mother    Mental illness Mother        Depression   Breast cancer Mother    Cancer Father        bladder?   Hypertension Father    Alcohol abuse Father    Heart disease Sister    Hearing loss Sister        valve   Alcohol abuse Brother    Diabetes Paternal Aunt    COPD Sister    Alcohol abuse Brother    Alcohol abuse Brother    Alcohol abuse Brother    Early death Brother        MVA   Cancer Maternal Grandmother    Colon cancer Neg Hx    Liver cancer Neg Hx     Social History Social History   Tobacco Use   Smoking status: Former    Current packs/day: 0.00    Average packs/day: 1 pack/day for 60.0 years (60.0 ttl pk-yrs)    Types: Cigarettes    Start date: 07/01/1961    Quit date: 01/29/2021    Years since quitting: 2.3   Smokeless tobacco: Never   Tobacco comments:    Has tried to quit, but his depressed mood  makes it challenging to quit  Vaping Use   Vaping status: Never Used  Substance Use Topics   Alcohol use: Not Currently    Comment: history of heavy alcohol use; last drank alcohol about 3 years ago   Drug use: Not Currently    Types: Marijuana, Cocaine, Heroin, "Crack" cocaine    Comment: Last used heroin in his 37s. Last used cocaine and marijuana about 3 years ago.      Allergies   Onion and Other  Review of Systems Review of Systems Per HPI  Physical Exam Triage Vital Signs ED Triage Vitals  Encounter Vitals Group     BP 05/30/23 1708 132/83     Systolic BP Percentile --      Diastolic BP Percentile --      Pulse Rate 05/30/23 1708 87     Resp 05/30/23 1708 20     Temp 05/30/23 1708 98.8 F (37.1 C)     Temp Source 05/30/23 1708 Oral     SpO2 05/30/23 1708 96 %     Weight --      Height --      Head Circumference --      Peak Flow --      Pain Score 05/30/23 1709 0     Pain Loc --      Pain Education --      Exclude from Growth Chart --    No data found.  Updated Vital Signs BP 132/83 (BP Location: Right Arm)   Pulse 87   Temp 98.8 F (37.1 C) (Oral)   Resp 20   SpO2 96%   Visual Acuity Right Eye Distance:   Left Eye Distance:   Bilateral Distance:    Right Eye Near:   Left Eye Near:    Bilateral Near:     Physical Exam Vitals and nursing note reviewed.  Constitutional:      Appearance: Normal appearance.  HENT:     Head: Atraumatic.     Mouth/Throat:     Mouth: Mucous membranes are moist.  Eyes:     Extraocular Movements: Extraocular movements intact.     Pupils: Pupils are equal, round, and reactive to light.     Comments: Conjunctival injection bilaterally, lower eyelid on the left erythematous, mildly edematous with styes forming to the inner lower eyelid  Cardiovascular:     Rate and Rhythm: Normal rate and regular rhythm.  Pulmonary:     Effort: Pulmonary effort is normal.     Breath sounds: Normal breath sounds.   Musculoskeletal:        General: Normal range of motion.     Cervical back: Normal range of motion and neck supple.  Skin:    General: Skin is warm and dry.  Neurological:     General: No focal deficit present.     Mental Status: He is oriented to person, place, and time.  Psychiatric:        Mood and Affect: Mood normal.        Thought Content: Thought content normal.        Judgment: Judgment normal.      UC Treatments / Results  Labs (all labs ordered are listed, but only abnormal results are displayed) Labs Reviewed - No data to display  EKG   Radiology No results found.  Procedures Procedures (including critical care time)  Medications Ordered in UC Medications - No data to display  Initial Impression / Assessment and Plan / UC Course  I have reviewed the triage vital signs and the nursing notes.  Pertinent labs & imaging results that were available during my care of the patient were reviewed by me and considered in my medical decision making (see chart for details).     Visual acuity testing deferred by patient as vision intact per patient.  Suspect ongoing allergic conjunctivitis causing a secondary bacterial infection of the conjunctiva.  Treat with Pataday drops ongoing, erythromycin ointment and warm compresses for the left eye.  Follow-up with primary care for recheck  Final Clinical Impressions(s) / UC Diagnoses   Final diagnoses:  Allergic conjunctivitis of both eyes  Hordeolum externum of left lower eyelid     Discharge Instructions      You may take an antihistamine daily to keep allergies under control.  I have sent in an allergy eyedrop that you may use twice daily ongoing as needed and an antibiotic ointment for the left eye twice daily.  You may also use warm compresses, over-the-counter pain relievers.  Follow-up with your primary care provider if not resolving    ED Prescriptions     Medication Sig Dispense Auth. Provider    erythromycin ophthalmic ointment Place a 1/2 inch ribbon of ointment into the left lower eyelid BID prn. 3.5 g Particia Nearing, PA-C   olopatadine (PATADAY) 0.1 % ophthalmic solution Place 1 drop into both eyes 2 (two) times daily. 5 mL Particia Nearing, [place]      PDMP not reviewed this encounter.   Particia Nearing, [place] 05/30/23 1801

## 2023-05-30 NOTE — Discharge Instructions (Signed)
You may take an antihistamine daily to keep allergies under control.  I have sent in an allergy eyedrop that you may use twice daily ongoing as needed and an antibiotic ointment for the left eye twice daily.  You may also use warm compresses, over-the-counter pain relievers.  Follow-up with your primary care provider if not resolving

## 2023-05-30 NOTE — ED Triage Notes (Signed)
Pt reports his left eye is red puffy and swollen x 1 week. States it goes to both eyes at times.    Took an allergy pill

## 2023-06-03 ENCOUNTER — Other Ambulatory Visit: Payer: Self-pay

## 2023-06-03 DIAGNOSIS — E1169 Type 2 diabetes mellitus with other specified complication: Secondary | ICD-10-CM

## 2023-06-03 MED ORDER — EMPAGLIFLOZIN 10 MG PO TABS: ORAL_TABLET | ORAL | 3 refills | Status: DC

## 2023-06-04 ENCOUNTER — Telehealth: Payer: Self-pay

## 2023-06-04 NOTE — Telephone Encounter (Signed)
Copied from CRM [id number]. Topic: Clinical - Medical Advice >> Jun 04, 2023  8:49 AM Mosetta Putt H wrote: Reason for CRM: Patient is experiencing eye infection went to urgent care haven't seen any improvement, would like to know what's best, left eye is red but swelling went down

## 2023-06-24 ENCOUNTER — Ambulatory Visit: Payer: Medicare HMO | Admitting: Internal Medicine

## 2023-06-26 ENCOUNTER — Telehealth: Payer: Self-pay | Admitting: Family Medicine

## 2023-06-26 ENCOUNTER — Other Ambulatory Visit: Payer: Self-pay | Admitting: Internal Medicine

## 2023-06-26 ENCOUNTER — Other Ambulatory Visit: Payer: Self-pay

## 2023-06-26 DIAGNOSIS — E782 Mixed hyperlipidemia: Secondary | ICD-10-CM

## 2023-06-26 DIAGNOSIS — E1169 Type 2 diabetes mellitus with other specified complication: Secondary | ICD-10-CM

## 2023-06-26 MED ORDER — ATORVASTATIN CALCIUM 10 MG PO TABS: ORAL_TABLET | ORAL | 0 refills | Status: DC

## 2023-06-26 MED ORDER — METFORMIN HCL 500 MG PO TABS: 500.0000 mg | ORAL_TABLET | Freq: Every day | ORAL | 3 refills | Status: AC

## 2023-06-26 MED ORDER — ATORVASTATIN CALCIUM 10 MG PO TABS
10.0000 mg | ORAL_TABLET | Freq: Every day | ORAL | 1 refills | Status: DC
Start: 2023-06-26 — End: 2023-07-31

## 2023-06-26 NOTE — Telephone Encounter (Signed)
Copied from CRM [id number]. Topic: Clinical - Medication Refill >> Jun 26, 2023  9:17 AM Nila Nephew wrote: Most Recent Primary Care Visit:  Provider: Anabel Halon  Department: [facility]  Visit Type: OFFICE VISIT  Date: 03/31/2023  Medication:  atorvastatin (LIPITOR) 10 MG tablet  metFORMIN (GLUCOPHAGE) 500 MG tablet  Has the patient contacted their pharmacy? No  Is this the correct pharmacy for this prescription? Yes This is the patient's preferred pharmacy:   [facility] - [place], [place] - [address] [place] [address] Phone: [phone] Fax: [phone]   Has the prescription been filled recently? Yes  Is the patient out of the medication? Yes  Has the patient been seen for an appointment in the last year OR does the patient have an upcoming appointment? Yes  Can we respond through MyChart? No  Agent: Please be advised that Rx refills may take up to 3 business days. We ask that you follow-up with your pharmacy.

## 2023-06-30 ENCOUNTER — Telehealth: Payer: Self-pay

## 2023-06-30 ENCOUNTER — Other Ambulatory Visit: Payer: Self-pay

## 2023-06-30 DIAGNOSIS — E1169 Type 2 diabetes mellitus with other specified complication: Secondary | ICD-10-CM

## 2023-06-30 MED ORDER — GLIPIZIDE 5 MG PO TABS: 5.0000 mg | ORAL_TABLET | Freq: Two times a day (BID) | ORAL | 1 refills | Status: DC

## 2023-06-30 NOTE — Telephone Encounter (Signed)
Copied from CRM [id number]. Topic: Clinical - Medication Refill >> Jun 30, 2023  9:21 AM Nila Nephew wrote: Most Recent Primary Care Visit:  Provider: Anabel Halon  Department: [facility]  Visit Type: OFFICE VISIT  Date: 03/31/2023  Medication: Glucotrol 5mG   Has the patient contacted their pharmacy? Yes Yes, has no refills.  Is this the correct pharmacy for this prescription? Yes  This is the patient's preferred pharmacy:   [facility] - [place], [place] - [address] [place] [address] Phone: [phone] Fax: [phone]   Has the prescription been filled recently? Yes  Is the patient out of the medication? Yes - has one more day's worth  Has the patient been seen for an appointment in the last year OR does the patient have an upcoming appointment? Yes  Can we respond through MyChart? No - Phone Call Preferred  Agent: Please be advised that Rx refills may take up to 3 business days. We ask that you follow-up with your pharmacy.

## 2023-06-30 NOTE — Telephone Encounter (Signed)
Refill sent to pharmacy.   

## 2023-07-25 ENCOUNTER — Other Ambulatory Visit: Payer: Self-pay | Admitting: Internal Medicine

## 2023-07-25 DIAGNOSIS — I10 Essential (primary) hypertension: Secondary | ICD-10-CM

## 2023-07-31 ENCOUNTER — Ambulatory Visit (INDEPENDENT_AMBULATORY_CARE_PROVIDER_SITE_OTHER): Payer: 59 | Admitting: Internal Medicine

## 2023-07-31 ENCOUNTER — Encounter: Payer: Self-pay | Admitting: Internal Medicine

## 2023-07-31 VITALS — BP 136/73 | HR 79 | Ht 70.0 in | Wt 227.4 lb

## 2023-07-31 DIAGNOSIS — J432 Centrilobular emphysema: Secondary | ICD-10-CM | POA: Diagnosis not present

## 2023-07-31 DIAGNOSIS — N1831 Chronic kidney disease, stage 3a: Secondary | ICD-10-CM

## 2023-07-31 DIAGNOSIS — I700 Atherosclerosis of aorta: Secondary | ICD-10-CM

## 2023-07-31 DIAGNOSIS — E1159 Type 2 diabetes mellitus with other circulatory complications: Secondary | ICD-10-CM | POA: Diagnosis not present

## 2023-07-31 DIAGNOSIS — I739 Peripheral vascular disease, unspecified: Secondary | ICD-10-CM | POA: Diagnosis not present

## 2023-07-31 DIAGNOSIS — Z720 Tobacco use: Secondary | ICD-10-CM | POA: Diagnosis not present

## 2023-07-31 DIAGNOSIS — F17210 Nicotine dependence, cigarettes, uncomplicated: Secondary | ICD-10-CM | POA: Diagnosis not present

## 2023-07-31 DIAGNOSIS — F330 Major depressive disorder, recurrent, mild: Secondary | ICD-10-CM

## 2023-07-31 DIAGNOSIS — I10 Essential (primary) hypertension: Secondary | ICD-10-CM

## 2023-07-31 DIAGNOSIS — E782 Mixed hyperlipidemia: Secondary | ICD-10-CM

## 2023-07-31 DIAGNOSIS — R5382 Chronic fatigue, unspecified: Secondary | ICD-10-CM | POA: Insufficient documentation

## 2023-07-31 DIAGNOSIS — E538 Deficiency of other specified B group vitamins: Secondary | ICD-10-CM

## 2023-07-31 DIAGNOSIS — F331 Major depressive disorder, recurrent, moderate: Secondary | ICD-10-CM

## 2023-07-31 MED ORDER — ATORVASTATIN CALCIUM 20 MG PO TABS
20.0000 mg | ORAL_TABLET | Freq: Every day | ORAL | 1 refills | Status: DC
Start: 2023-07-31 — End: 2023-11-20

## 2023-07-31 MED ORDER — BUPROPION HCL ER (XL) 150 MG PO TB24
150.0000 mg | ORAL_TABLET | Freq: Every day | ORAL | 3 refills | Status: DC
Start: 2023-07-31 — End: 2023-10-28

## 2023-07-31 NOTE — Patient Instructions (Addendum)
Please start taking Atorvastatin 20 mg once daily (okay to take 2 tablets of 10 mg until you complete current supplies).  Please continue to take other medications as prescribed.  Please continue to follow low carb diet and perform moderate exercise/walking as tolerated.

## 2023-07-31 NOTE — Assessment & Plan Note (Signed)
Noted on CT chest Has been trying to cut down smoking Asymptomatic currently

## 2023-07-31 NOTE — Progress Notes (Signed)
Established Patient Office Visit  Subjective:  Patient ID: Alex Marks, male    DOB: Oct 17, 1945  Age: 78 y.o. MRN: [id number]  CC:  Chief Complaint  Patient presents with   Diabetes   Hypertension    HPI Alex Marks is a 78 y.o. male with past medical history of HTN, PVD, hep C, type II DM, HLD and depression who presents for f/u of his chronic medical conditions.  PVD:  He saw vascular surgeon. He was placed on Pletal for it.  He is taking aspirin and statin. He complains of bilateral leg weakness and pain upon walking.  He also has gait disturbance.  Denies any numbness or tingling currently.  He has weak DPA pulse. He had [place] ABI done, which showed bilateral mild to moderate PAD.  He has also started taking fish oil and states that it has been helping with his leg weakness (?).  HTN: BP is well-controlled. Takes amlodipine-olmesartan 10-40 mg QD regularly. Patient denies headache, dizziness, chest pain, dyspnea or palpitations.  Type II DM: He takes metformin, glipizide and Jardiance for it.  His last Hgb A1c was 6.3 in 08/24. He does not check blood glucose at home. He denies any fatigue, polyuria or polydipsia.  He reports chronic fatigue.  CKD: Last CMP showed GFR of 59, overall stable.  Denies any dysuria, hematuria, urinary hesitance or resistance.  MDD: He had anhedonia, anxiety/agitation and feeling lonely.  He has been taking Wellbutrin regularly, and reports that it is helping him. He has tried multiple antidepressants in the past, but details are unknown. He reports that he had better response with Valium in the past.  He currently lives alone and does not have much support from family or friends.   Past Medical History:  Diagnosis Date   Anxiety    COPD (chronic obstructive pulmonary disease) (HCC)    Dark stools 07/08/2019   Depression    Depression    Frequency of urination    GAD (generalized anxiety disorder)    GERD (gastroesophageal reflux disease)     Hepatitis C    s/p treatment with Dr. Dulce Sellar in 2017. HCV RNA not detected in April 2021   Hypercholesterolemia    Hypertension    PAD (peripheral artery disease) (HCC)    Scalp lesion 04/12/2019   Substance abuse (HCC)    Transfusion of blood product refused for religious reason    Type II diabetes mellitus [facility])     Past Surgical History:  Procedure Laterality Date   ABDOMINAL AORTOGRAM W/LOWER EXTREMITY Bilateral 05/30/2017   ABDOMINAL AORTOGRAM W/LOWER EXTREMITY N/A 05/30/2017   Procedure: ABDOMINAL AORTOGRAM W/LOWER EXTREMITY;  Surgeon: Sherren Kerns, MD;  Location: [facility] INVASIVE [facility] LAB;  Service: Cardiovascular;  Laterality: N/A;  Bilateral    Family History  Problem Relation Age of Onset   Alzheimer's disease Mother    Mental illness Mother        Depression   Breast cancer Mother    Cancer Father        bladder?   Hypertension Father    Alcohol abuse Father    Heart disease Sister    Hearing loss Sister        valve   Alcohol abuse Brother    Diabetes Paternal Aunt    COPD Sister    Alcohol abuse Brother    Alcohol abuse Brother    Alcohol abuse Brother    Early death Brother  MVA   Cancer Maternal Grandmother    Colon cancer Neg Hx    Liver cancer Neg Hx     Social History   Socioeconomic History   Marital status: Single    Spouse name: Not on file   Number of children: 0   Years of education: 11   Highest education level: Not on file  Occupational History   Occupation: retired    Comment: labor  Tobacco Use   Smoking status: Former    Current packs/day: 0.00    Average packs/day: 1 pack/day for 60.0 years (60.0 ttl pk-yrs)    Types: Cigarettes    Start date: 07/01/1961    Quit date: 01/29/2021    Years since quitting: 2.5   Smokeless tobacco: Never   Tobacco comments:    Has tried to quit, but his depressed mood makes it challenging to quit  Vaping Use   Vaping status: Never Used  Substance and Sexual Activity   Alcohol use: Not  Currently    Comment: history of heavy alcohol use; last drank alcohol about 3 years ago   Drug use: Not Currently    Types: Marijuana, Cocaine, Heroin, "Crack" cocaine    Comment: Last used heroin in his 20s. Last used cocaine and marijuana about 3 years ago.    Sexual activity: Not Currently  Other Topics Concern   Not on file  Social History Narrative   Single.Live in apartment home   Never had license   Likes to read   Social Drivers of Health   Financial Resource Strain: Low Risk  (07/24/2022)   Overall Financial Resource Strain (CARDIA)    Difficulty of Paying Living Expenses: Not hard at all  Food Insecurity: No Food Insecurity (07/24/2022)   Hunger Vital Sign    Worried About Running Out of Food in the Last Year: Never true    Ran Out of Food in the Last Year: Never true  Transportation Needs: Unmet Transportation Needs (07/24/2022)   PRAPARE - Administrator, Civil Service (Medical): Yes    Lack of Transportation (Non-Medical): Yes  Physical Activity: Inactive (07/24/2022)   Exercise Vital Sign    Days of Exercise per Week: 0 days    Minutes of Exercise per Session: 0 min  Stress: No Stress Concern Present (07/24/2022)   [employer] of Occupational Health - Occupational Stress Questionnaire    Feeling of Stress : Not at all  Social Connections: Moderately Isolated (07/24/2022)   Social Connection and Isolation Panel [NHANES]    Frequency of Communication with Friends and Family: Never    Frequency of Social Gatherings with Friends and Family: Never    Attends Religious Services: More than 4 times per year    Active Member of [employer] or Organizations: Yes    Attends Banker Meetings: More than 4 times per year    Marital Status: Never married  Intimate Partner Violence: Not At Risk (07/24/2022)   Humiliation, Afraid, Rape, and Kick questionnaire    Fear of Current or Ex-Partner: No    Emotionally Abused: No    Physically Abused: No     Sexually Abused: No    Outpatient Medications Prior to Visit  Medication Sig Dispense Refill   amLODipine-olmesartan (AZOR) 10-40 MG tablet Take 1 tablet by mouth daily. 100 tablet 3   aspirin 81 MG chewable tablet Chew 1 tablet by mouth daily.     blood glucose meter kit and supplies Dispense based on patient and insurance  preference. Use up to four times daily as directed. (FOR ICD-10 E10.9, E11.9). 1 each 0   Carboxymethylcell-Glycerin PF (REFRESH OPTIVE PF) 0.5-0.9 % SOLN Place 2 drops into both eyes daily as needed (Irritation).     Cholecalciferol (VITAMIN D3) 25 MCG (1000 UT) CAPS Take 1 capsule (1,000 Units total) by mouth in the morning and at bedtime. 60 capsule 0   cilostazol (PLETAL) 100 MG tablet Take 1 tablet (100 mg total) by mouth 2 (two) times daily before a meal. 60 tablet 11   empagliflozin (JARDIANCE) 10 MG TABS tablet TAKE 1 TABLET(10 MG) BY MOUTH DAILY BEFORE BREAKFAST 30 tablet 3   erythromycin ophthalmic ointment Place a 1/2 inch ribbon of ointment into the left lower eyelid BID prn. 3.5 g 0   fexofenadine (ALLEGRA) 180 MG tablet Take 180 mg by mouth daily.     glipiZIDE (GLUCOTROL) 5 MG tablet Take 1 tablet (5 mg total) by mouth 2 (two) times daily before a meal. 180 tablet 1   metFORMIN (GLUCOPHAGE) 500 MG tablet Take 1 tablet (500 mg total) by mouth daily with breakfast. 180 tablet 3   Multiple Vitamin (MULTIVITAMIN WITH MINERALS) TABS tablet Take 1 tablet by mouth daily. [facility]     olopatadine (PATADAY) 0.1 % ophthalmic solution Place 1 drop into both eyes 2 (two) times daily. 5 mL 0   Omega-3 Fatty Acids (FISH OIL PEARLS) 300 MG CAPS Take 600 mg by mouth 2 (two) times daily. (Patient taking differently: Take 1,000 mg by mouth at bedtime.) 60 capsule 3   oxymetazoline (NASAL SPRAY MOISTURIZING 12 HR) 0.05 % nasal spray Place 1 spray into both nostrils daily as needed for congestion.     vitamin C (ASCORBIC ACID) 500 MG tablet Take 500 mg by mouth at bedtime.      atorvastatin (LIPITOR) 10 MG tablet Take 1 tablet (10 mg total) by mouth daily. 90 tablet 1   buPROPion (WELLBUTRIN XL) 150 MG 24 hr tablet TAKE ONE TABLET BY MOUTH EVERY DAY 30 tablet 3   No facility-administered medications prior to visit.    Allergies  Allergen Reactions   Onion     Bags under eyes, indigestion    Other Swelling    Hot sauce -- causes eye swelling Spicy food    ROS Review of Systems  Constitutional:  Positive for fatigue. Negative for chills and fever.  HENT:  Negative for congestion and sore throat.   Eyes:  Negative for pain and discharge.  Respiratory:  Negative for cough and shortness of breath.   Cardiovascular:  Negative for chest pain and palpitations.  Gastrointestinal:  Negative for diarrhea, nausea and vomiting.  Endocrine: Negative for polydipsia and polyuria.  Genitourinary:  Negative for dysuria and hematuria.  Musculoskeletal:  Positive for arthralgias, gait problem and myalgias. Negative for neck pain and neck stiffness.  Skin:  Negative for rash.  Neurological:  Negative for dizziness, weakness, numbness and headaches.  Psychiatric/Behavioral:  Positive for sleep disturbance. Negative for agitation, behavioral problems and suicidal ideas. The patient is nervous/anxious.       Objective:    Physical Exam Vitals reviewed.  Constitutional:      General: He is not in acute distress.    Appearance: He is not diaphoretic.  HENT:     Head: Normocephalic and atraumatic.     Nose: Nose normal.     Mouth/Throat:     Mouth: Mucous membranes are moist.  Eyes:     General: No scleral icterus.  Extraocular Movements: Extraocular movements intact.  Cardiovascular:     Rate and Rhythm: Normal rate and regular rhythm.     Heart sounds: Normal heart sounds. No murmur heard. Pulmonary:     Breath sounds: Normal breath sounds. No wheezing or rales.  Abdominal:     Palpations: Abdomen is soft.     Tenderness: There is no abdominal tenderness.   Musculoskeletal:     Cervical back: Neck supple. No tenderness.     Right lower leg: No edema.     Left lower leg: No edema.  Skin:    General: Skin is warm.     Findings: No rash.  Neurological:     General: No focal deficit present.     Mental Status: He is alert and oriented to person, place, and time. Mental status is at baseline.     Cranial Nerves: No cranial nerve deficit.     Sensory: No sensory deficit.     Motor: Weakness (4/5 in b/l LE) present.  Psychiatric:        Mood and Affect: Mood normal.        Behavior: Behavior normal.     BP 136/73   Pulse 79   Ht 5\' 10"  (1.778 m)   Wt 227 lb 6.4 oz (103.1 kg)   SpO2 95%   BMI 32.63 kg/m  Wt Readings from Last 3 Encounters:  07/31/23 227 lb 6.4 oz (103.1 kg)  04/10/23 223 lb (101.2 kg)  03/31/23 223 lb 9.6 oz (101.4 kg)    Lab Results  Component Value Date   TSH 2.630 09/02/2022   Lab Results  Component Value Date   WBC 11.8 (H) 09/02/2022   HGB 16.0 09/02/2022   HCT 47.4 09/02/2022   MCV 88 09/02/2022   PLT 254 09/02/2022   Lab Results  Component Value Date   NA 141 02/17/2023   K 4.2 02/17/2023   CO2 17 (L) 02/17/2023   GLUCOSE 101 (H) 02/17/2023   BUN 20 02/17/2023   CREATININE 1.26 02/17/2023   BILITOT 0.8 02/17/2023   ALKPHOS 125 (H) 02/17/2023   AST 24 02/17/2023   ALT 27 02/17/2023   PROT 6.9 02/17/2023   ALBUMIN 4.3 02/17/2023   CALCIUM 9.5 02/17/2023   ANIONGAP 11 11/16/2019   EGFR 59 (L) 02/17/2023   Lab Results  Component Value Date   CHOL 124 09/02/2022   Lab Results  Component Value Date   HDL 35 (L) 09/02/2022   Lab Results  Component Value Date   LDLCALC 72 09/02/2022   Lab Results  Component Value Date   TRIG 90 09/02/2022   Lab Results  Component Value Date   CHOLHDL 3.5 09/02/2022   Lab Results  Component Value Date   HGBA1C 6.3 (H) 02/17/2023      Assessment & Plan:   Problem List Items Addressed This Visit       Cardiovascular and Mediastinum    Essential hypertension   BP Readings from Last 1 Encounters:  07/31/23 136/73   Well-controlled with amlodipine-olmesartan 10-40 mg QD now Counseled for compliance with the medications Advised DASH diet and moderate exercise/walking, at least 150 mins/week      Relevant Medications   atorvastatin (LIPITOR) 20 MG tablet   PVD (peripheral vascular disease) (HCC)   On aspirin, Pletal and statin -increased dose of atorvastatin to 20 mg QD for now Has claudication symptoms Has weak pulse on DPA Vascular surgery referral provided Checked [place] ABI  Relevant Medications   atorvastatin (LIPITOR) 20 MG tablet   Aortic atherosclerosis (HCC)   Noted on CT chest On aspirin and statin      Relevant Medications   atorvastatin (LIPITOR) 20 MG tablet     Respiratory   Centrilobular emphysema (HCC)   Noted on CT chest Has been trying to cut down smoking Asymptomatic currently        Endocrine   Type 2 diabetes mellitus (HCC) - Primary   Lab Results  Component Value Date   HGBA1C 6.3 (H) 02/17/2023   Well-controlled On glipizide, metformin and Jardiance Needs to check blood glucose regularly -advised to contact if blood glucose less than 70 Advised to follow diabetic diet On statin and ARB F/u CMP and lipid panel Diabetic eye exam: Advised to follow up with Ophthalmology for diabetic eye exam      Relevant Medications   atorvastatin (LIPITOR) 20 MG tablet   Other Relevant Orders   CMP14+EGFR   Hemoglobin A1c     Genitourinary   Stage 3a chronic kidney disease (HCC)   Last CMP showed GFR of 59, overall stable Checked urine microalbumin/creatinine ratio - On ARB and Jardiance Avoid nephrotoxic agents Advised to maintain adequate hydration -needs to cut down soft drinks intake      Relevant Orders   CBC with Differential/Platelet   CMP14+EGFR   Vitamin D (25 hydroxy)     Other   Tobacco abuse   Smokes about 0.5 pack/day now  Asked about quitting: confirms that  he/she currently smokes cigarettes Advise to quit smoking: Educated about QUITTING to reduce the risk of cancer, cardio and cerebrovascular disease. Assess willingness: Unwilling to quit at this time, but is working on cutting back.  Started Wellbutrin for MDD and anxiety. Assist with counseling and pharmacotherapy: Counseled for 5 minutes and literature provided. Arrange for follow up: follow up in 3 months and continue to offer help.      HLD (hyperlipidemia)   Increased dose of atorvastatin to 20 mg once daily considering PAD - LDL was 72 in 03/24 Check lipid profile      Relevant Medications   atorvastatin (LIPITOR) 20 MG tablet   Recurrent depressive disorder, current episode mild (HCC)   Overall well controlled On Wellbutrin for MDD and anxiety, symptoms improved If persistent anxiety, may add Valium - explained that Valium would not help depression      Relevant Medications   buPROPion (WELLBUTRIN XL) 150 MG 24 hr tablet   Chronic fatigue   No other current constitutional symptoms such as fever, chills, weight loss, night sweats or LAD Check CBC, CMP, TSH, vitamin D and B-12      Relevant Orders   CBC with Differential/Platelet   TSH + free T4   B12   Other Visit Diagnoses       B12 deficiency       Relevant Orders   B12        Meds ordered this encounter  Medications   atorvastatin (LIPITOR) 20 MG tablet    Sig: Take 1 tablet (20 mg total) by mouth daily.    Dispense:  90 tablet    Refill:  1    Dose change - 07/31/23   buPROPion (WELLBUTRIN XL) 150 MG 24 hr tablet    Sig: Take 1 tablet (150 mg total) by mouth daily.    Dispense:  30 tablet    Refill:  3    This prescription was filled on 03/31/2023. Any refills authorized  will be placed on file.    Follow-up: Return in about 3 months (around 10/29/2023) for Annual physical.    Anabel Halon, MD

## 2023-07-31 NOTE — Assessment & Plan Note (Addendum)
Overall well controlled On Wellbutrin for MDD and anxiety, symptoms improved If persistent anxiety, may add Valium - explained that Valium would not help depression

## 2023-07-31 NOTE — Assessment & Plan Note (Addendum)
On aspirin, Pletal and statin -increased dose of atorvastatin to 20 mg QD for now Has claudication symptoms Has weak pulse on DPA Vascular surgery referral provided Checked [place] ABI

## 2023-07-31 NOTE — Assessment & Plan Note (Signed)
BP Readings from Last 1 Encounters:  07/31/23 136/73   Well-controlled with amlodipine-olmesartan 10-40 mg QD now Counseled for compliance with the medications Advised DASH diet and moderate exercise/walking, at least 150 mins/week

## 2023-07-31 NOTE — Assessment & Plan Note (Signed)
No other current constitutional symptoms such as fever, chills, weight loss, night sweats or LAD Check CBC, CMP, TSH, vitamin D and B-12

## 2023-07-31 NOTE — Assessment & Plan Note (Signed)
Smokes about 0.5 pack/day now  Asked about quitting: confirms that he/she currently smokes cigarettes Advise to quit smoking: Educated about QUITTING to reduce the risk of cancer, cardio and cerebrovascular disease. Assess willingness: Unwilling to quit at this time, but is working on cutting back.  Started Wellbutrin for MDD and anxiety. Assist with counseling and pharmacotherapy: Counseled for 5 minutes and literature provided. Arrange for follow up: follow up in 3 months and continue to offer help.

## 2023-07-31 NOTE — Assessment & Plan Note (Signed)
Increased dose of atorvastatin to 20 mg once daily considering PAD - LDL was 72 in 03/24 Check lipid profile

## 2023-07-31 NOTE — Assessment & Plan Note (Addendum)
Lab Results  Component Value Date   HGBA1C 6.3 (H) 02/17/2023   Well-controlled On glipizide, metformin and Jardiance Needs to check blood glucose regularly -advised to contact if blood glucose less than 70 Advised to follow diabetic diet On statin and ARB F/u CMP and lipid panel Diabetic eye exam: Advised to follow up with Ophthalmology for diabetic eye exam

## 2023-07-31 NOTE — Assessment & Plan Note (Signed)
Last CMP showed GFR of 59, overall stable Checked urine microalbumin/creatinine ratio - On ARB and Jardiance Avoid nephrotoxic agents Advised to maintain adequate hydration -needs to cut down soft drinks intake

## 2023-07-31 NOTE — Assessment & Plan Note (Signed)
Noted on CT chest On aspirin and statin

## 2023-08-01 LAB — CMP14+EGFR
ALT: 31 [IU]/L (ref 0–44)
AST: 23 [IU]/L (ref 0–40)
Albumin: 4.2 g/dL (ref 3.8–4.8)
Alkaline Phosphatase: 149 [IU]/L — ABNORMAL HIGH (ref 44–121)
BUN/Creatinine Ratio: 11 (ref 10–24)
BUN: 16 mg/dL (ref 8–27)
Bilirubin Total: 0.3 mg/dL (ref 0.0–1.2)
CO2: 17 mmol/L — ABNORMAL LOW (ref 20–29)
Calcium: 9.4 mg/dL (ref 8.6–10.2)
Chloride: 103 mmol/L (ref 96–106)
Creatinine, Ser: 1.48 mg/dL — ABNORMAL HIGH (ref 0.76–1.27)
Globulin, Total: 2.8 g/dL (ref 1.5–4.5)
Glucose: 147 mg/dL — ABNORMAL HIGH (ref 70–99)
Potassium: 4.3 mmol/L (ref 3.5–5.2)
Sodium: 139 mmol/L (ref 134–144)
Total Protein: 7 g/dL (ref 6.0–8.5)
eGFR: 49 mL/min/{1.73_m2} — ABNORMAL LOW (ref >59)

## 2023-08-01 LAB — CBC WITH DIFFERENTIAL/PLATELET
Basophils Absolute: 0.1 10*3/uL (ref 0.0–0.2)
Basos: 1 %
EOS (ABSOLUTE): 0.3 10*3/uL (ref 0.0–0.4)
Eos: 3 %
Hematocrit: 48.2 % (ref 37.5–51.0)
Hemoglobin: 16.1 g/dL (ref 13.0–17.7)
Immature Grans (Abs): 0 10*3/uL (ref 0.0–0.1)
Immature Granulocytes: 0 %
Lymphocytes Absolute: 2.7 10*3/uL (ref 0.7–3.1)
Lymphs: 26 %
MCH: 29.9 pg (ref 26.6–33.0)
MCHC: 33.4 g/dL (ref 31.5–35.7)
MCV: 90 fL (ref 79–97)
Monocytes Absolute: 0.9 10*3/uL (ref 0.1–0.9)
Monocytes: 8 %
Neutrophils Absolute: 6.6 10*3/uL (ref 1.4–7.0)
Neutrophils: 62 %
Platelets: 256 10*3/uL (ref 150–450)
RBC: 5.38 x10E6/uL (ref 4.14–5.80)
RDW: 13.3 % (ref 11.6–15.4)
WBC: 10.6 10*3/uL (ref 3.4–10.8)

## 2023-08-01 LAB — TSH+FREE T4
Free T4: 1.3 ng/dL (ref 0.82–1.77)
TSH: 4.03 u[IU]/mL (ref 0.450–4.500)

## 2023-08-01 LAB — HEMOGLOBIN A1C
Est. average glucose Bld gHb Est-mCnc: 154 mg/dL
Hgb A1c MFr Bld: 7 % — ABNORMAL HIGH (ref 4.8–5.6)

## 2023-08-01 LAB — VITAMIN D 25 HYDROXY (VIT D DEFICIENCY, FRACTURES): Vit D, 25-Hydroxy: 43.9 ng/mL (ref 30.0–100.0)

## 2023-08-02 LAB — SPECIMEN STATUS REPORT

## 2023-08-02 LAB — VITAMIN B12: Vitamin B-12: 1627 pg/mL — ABNORMAL HIGH (ref 232–1245)

## 2023-08-26 ENCOUNTER — Ambulatory Visit (INDEPENDENT_AMBULATORY_CARE_PROVIDER_SITE_OTHER): Payer: 59

## 2023-08-26 ENCOUNTER — Other Ambulatory Visit: Payer: Self-pay | Admitting: Internal Medicine

## 2023-08-26 VITALS — Ht 70.0 in | Wt 227.0 lb

## 2023-08-26 DIAGNOSIS — Z789 Other specified health status: Secondary | ICD-10-CM

## 2023-08-26 DIAGNOSIS — Z638 Other specified problems related to primary support group: Secondary | ICD-10-CM

## 2023-08-26 DIAGNOSIS — Z0100 Encounter for examination of eyes and vision without abnormal findings: Secondary | ICD-10-CM

## 2023-08-26 DIAGNOSIS — Z0001 Encounter for general adult medical examination with abnormal findings: Secondary | ICD-10-CM

## 2023-08-26 DIAGNOSIS — E1159 Type 2 diabetes mellitus with other circulatory complications: Secondary | ICD-10-CM

## 2023-08-26 DIAGNOSIS — Z5982 Transportation insecurity: Secondary | ICD-10-CM | POA: Diagnosis not present

## 2023-08-26 DIAGNOSIS — Z0000 Encounter for general adult medical examination without abnormal findings: Secondary | ICD-10-CM

## 2023-08-26 MED ORDER — LANCETS MISC. MISC
1.0000 | Freq: Three times a day (TID) | 3 refills | Status: AC
Start: 2023-08-26 — End: ?

## 2023-08-26 MED ORDER — BLOOD GLUCOSE TEST VI STRP
1.0000 | ORAL_STRIP | Freq: Three times a day (TID) | 3 refills | Status: AC
Start: 2023-08-26 — End: ?

## 2023-08-26 MED ORDER — LANCET DEVICE MISC
1.0000 | Freq: Three times a day (TID) | 0 refills | Status: AC
Start: 2023-08-26 — End: 2023-09-25

## 2023-08-26 MED ORDER — BLOOD GLUCOSE MONITORING SUPPL DEVI
1.0000 | Freq: Three times a day (TID) | 0 refills | Status: AC
Start: 2023-08-26 — End: ?

## 2023-08-26 NOTE — Progress Notes (Signed)
 Subjective:   Alex Marks is a 78 y.o. who presents for a Medicare Wellness preventive visit.  Visit Complete: Virtual I connected with  Alex Marks on 08/26/23 by a audio enabled telemedicine application and verified that I am speaking with the correct person using two identifiers.  Patient Location: Home  Provider Location: Home Office  I discussed the limitations of evaluation and management by telemedicine. The patient expressed understanding and agreed to proceed.  Vital Signs: Because this visit was a virtual/telehealth visit, some criteria may be missing or patient reported. Any vitals not documented were not able to be obtained and vitals that have been documented are patient reported.  VideoDeclined- This patient declined Librarian, academic. Therefore the visit was completed with audio only.  AWV Questionnaire: No: Patient Medicare AWV questionnaire was not completed prior to this visit.  Cardiac Risk Factors include: advanced age (>50men, >34 women);diabetes mellitus;dyslipidemia;hypertension;male gender;obesity (BMI >30kg/m2);sedentary lifestyle;smoking/ tobacco exposure;Other (see comment), Risk factor comments: COPD     Objective:    Today's Vitals   08/26/23 0854 08/26/23 0856  Weight: 227 lb (103 kg)   Height: 5\' 10"  (1.778 m)   PainSc:  0-No pain   Body mass index is 32.57 kg/m.     08/26/2023    9:07 AM 07/24/2022   10:48 AM 07/05/2021   10:55 AM 11/16/2019    7:05 PM 06/07/2019   11:21 AM 05/30/2017    6:03 PM 05/30/2017    7:09 AM  Advanced Directives  Does Patient Have a Medical Advance Directive? No No No No No No No  Does patient want to make changes to medical advance directive?  No - Patient declined       Would patient like information on creating a medical advance directive? No - Patient declined No - Patient declined No - Patient declined  No - Patient declined No - Patient declined     Current Medications  (verified) Outpatient Encounter Medications as of 08/26/2023  Medication Sig   amLODipine-olmesartan (AZOR) 10-40 MG tablet Take 1 tablet by mouth daily.   aspirin 81 MG chewable tablet Chew 1 tablet by mouth daily.   atorvastatin (LIPITOR) 20 MG tablet Take 1 tablet (20 mg total) by mouth daily.   blood glucose meter kit and supplies Dispense based on patient and insurance preference. Use up to four times daily as directed. (FOR ICD-10 E10.9, E11.9).   buPROPion (WELLBUTRIN XL) 150 MG 24 hr tablet Take 1 tablet (150 mg total) by mouth daily.   Carboxymethylcell-Glycerin PF (REFRESH OPTIVE PF) 0.5-0.9 % SOLN Place 2 drops into both eyes daily as needed (Irritation).   Cholecalciferol (VITAMIN D3) 25 MCG (1000 UT) CAPS Take 1 capsule (1,000 Units total) by mouth in the morning and at bedtime.   cilostazol (PLETAL) 100 MG tablet Take 1 tablet (100 mg total) by mouth 2 (two) times daily before a meal.   empagliflozin (JARDIANCE) 10 MG TABS tablet TAKE 1 TABLET(10 MG) BY MOUTH DAILY BEFORE BREAKFAST   erythromycin ophthalmic ointment Place a 1/2 inch ribbon of ointment into the left lower eyelid BID prn.   fexofenadine (ALLEGRA) 180 MG tablet Take 180 mg by mouth daily.   glipiZIDE (GLUCOTROL) 5 MG tablet Take 1 tablet (5 mg total) by mouth 2 (two) times daily before a meal.   metFORMIN (GLUCOPHAGE) 500 MG tablet Take 1 tablet (500 mg total) by mouth daily with breakfast.   Multiple Vitamin (MULTIVITAMIN WITH MINERALS) TABS tablet Take 1  tablet by mouth daily. [facility]   olopatadine (PATADAY) 0.1 % ophthalmic solution Place 1 drop into both eyes 2 (two) times daily.   Omega-3 Fatty Acids (FISH OIL PEARLS) 300 MG CAPS Take 600 mg by mouth 2 (two) times daily. (Patient taking differently: Take 1,000 mg by mouth at bedtime.)   oxymetazoline (NASAL SPRAY MOISTURIZING 12 HR) 0.05 % nasal spray Place 1 spray into both nostrils daily as needed for congestion.   vitamin C (ASCORBIC ACID) 500 MG tablet  Take 500 mg by mouth at bedtime.   No facility-administered encounter medications on file as of 08/26/2023.    Allergies (verified) Onion and Other   History: Past Medical History:  Diagnosis Date   Anxiety    COPD (chronic obstructive pulmonary disease) (HCC)    Dark stools 07/08/2019   Depression    Depression    Frequency of urination    GAD (generalized anxiety disorder)    GERD (gastroesophageal reflux disease)    Hepatitis C    s/p treatment with Dr. Dulce Sellar in 2017. HCV RNA not detected in April 2021   Hypercholesterolemia    Hypertension    PAD (peripheral artery disease) (HCC)    Scalp lesion 04/12/2019   Substance abuse (HCC)    Transfusion of blood product refused for religious reason    Type II diabetes mellitus [facility])    Past Surgical History:  Procedure Laterality Date   ABDOMINAL AORTOGRAM W/LOWER EXTREMITY Bilateral 05/30/2017   ABDOMINAL AORTOGRAM W/LOWER EXTREMITY N/A 05/30/2017   Procedure: ABDOMINAL AORTOGRAM W/LOWER EXTREMITY;  Surgeon: Sherren Kerns, MD;  Location: [facility] INVASIVE CV LAB;  Service: Cardiovascular;  Laterality: N/A;  Bilateral   Family History  Problem Relation Age of Onset   Alzheimer's disease Mother    Mental illness Mother        Depression   Breast cancer Mother    Cancer Father        bladder?   Hypertension Father    Alcohol abuse Father    Heart disease Sister    Hearing loss Sister        valve   Alcohol abuse Brother    Diabetes Paternal Aunt    COPD Sister    Alcohol abuse Brother    Alcohol abuse Brother    Alcohol abuse Brother    Early death Brother        MVA   Cancer Maternal Grandmother    Colon cancer Neg Hx    Liver cancer Neg Hx    Social History   Socioeconomic History   Marital status: Single    Spouse name: Not on file   Number of children: 0   Years of education: 11   Highest education level: Not on file  Occupational History   Occupation: retired    Comment: labor  Tobacco Use   Smoking  status: Former    Current packs/day: 0.00    Average packs/day: 1 pack/day for 60.0 years (60.0 ttl pk-yrs)    Types: Cigarettes    Start date: 07/01/1961    Quit date: 01/29/2021    Years since quitting: 2.5   Smokeless tobacco: Never   Tobacco comments:    Has tried to quit, but his depressed mood makes it challenging to quit  Vaping Use   Vaping status: Never Used  Substance and Sexual Activity   Alcohol use: Not Currently    Comment: history of heavy alcohol use; last drank alcohol about 3 years ago  Drug use: Not Currently    Types: Marijuana, Cocaine, Heroin, "Crack" cocaine    Comment: Last used heroin in his 26s. Last used cocaine and marijuana about 3 years ago.    Sexual activity: Not Currently  Other Topics Concern   Not on file  Social History Narrative   Single.Live in apartment home   Never had license   Likes to read   Social Drivers of Health   Financial Resource Strain: Low Risk  (08/26/2023)   Overall Financial Resource Strain (CARDIA)    Difficulty of Paying Living Expenses: Not hard at all  Food Insecurity: No Food Insecurity (08/26/2023)   Hunger Vital Sign    Worried About Running Out of Food in the Last Year: Never true    Ran Out of Food in the Last Year: Never true  Transportation Needs: Unmet Transportation Needs (08/26/2023)   PRAPARE - Transportation    Lack of Transportation (Medical): Yes    Lack of Transportation (Non-Medical): Yes  Physical Activity: Inactive (08/26/2023)   Exercise Vital Sign    Days of Exercise per Week: 0 days    Minutes of Exercise per Session: 0 min  Stress: No Stress Concern Present (08/26/2023)   [employer] of Occupational Health - Occupational Stress Questionnaire    Feeling of Stress : Not at all  Social Connections: Socially Isolated (08/26/2023)   Social Connection and Isolation Panel [NHANES]    Frequency of Communication with Friends and Family: Never    Frequency of Social Gatherings with Friends and Family:  Never    Attends Religious Services: More than 4 times per year    Active Member of [employer] or Organizations: No    Attends Engineer, structural: Never    Marital Status: Never married    Tobacco Counseling Counseling given: Yes Tobacco comments: Has tried to quit, but his depressed mood makes it challenging to quit    Clinical Intake:  Pre-visit preparation completed: Yes  Pain : No/denies pain Pain Score: 0-No pain     BMI - recorded: 32.57 Nutritional Status: BMI > 30  Obese Nutritional Risks: None Diabetes: Yes CBG done?: No (telehealth visit.) Did pt. bring in CBG monitor from home?: No  How often do you need to have someone help you when you read instructions, pamphlets, or other written materials from your doctor or pharmacy?: 1 - Never  Interpreter Needed?: No  Information entered by :: Maryjean Ka CMA   Activities of Daily Living     08/26/2023    8:56 AM  In your present state of health, do you have any difficulty performing the following activities:  Hearing? 0  Vision? 0  Difficulty concentrating or making decisions? 0  Walking or climbing stairs? 0  Dressing or bathing? 0  Doing errands, shopping? 0  Preparing Food and eating ? N  Using the Toilet? N  In the past six months, have you accidently leaked urine? N  Do you have problems with loss of bowel control? N  Managing your Medications? N  Managing your Finances? N  Housekeeping or managing your Housekeeping? N    Patient Care Team: Anabel Halon, MD as PCP - General (Internal Medicine) Jena Gauss Gerrit Friends, MD as Consulting Physician (Gastroenterology) Phineas Real, PT as Physical Therapist (Physical Therapy) Merwyn Katos, DPM as Consulting Physician (Podiatry) Gleason, Darcella Gasman, PA-C as Physician Assistant (Physician Assistant) [facility]  Indicate any recent Medical Services you may have received  from other than [facility] providers in the past year  (date may be approximate).     Assessment:   This is a routine wellness examination for Alex Marks.  Hearing/Vision screen Hearing Screening - Comments:: Patient denies any hearing difficulties.   Vision Screening - Comments:: Patient is not up to date on yearly eye exams.  Referral placed today for eye doctor.    Goals Addressed             This Visit's Progress    Patient Stated       I'd like to keep my apartment clean     Quit smoking / using tobacco   Not on track      Depression Screen     08/26/2023    9:18 AM 07/31/2023    7:55 AM 03/31/2023   11:10 AM 02/17/2023   11:08 AM 10/16/2022   11:14 AM 09/02/2022    9:08 AM 07/24/2022   10:46 AM  PHQ 2/9 Scores  PHQ - 2 Score 0 0 3 3 3 3 6   PHQ- 9 Score 0 0 9 9 9 9 12     Fall Risk     08/26/2023    9:08 AM 07/31/2023    7:55 AM 03/31/2023   11:10 AM 02/17/2023   11:08 AM 10/16/2022   11:14 AM  Fall Risk   Falls in the past year? 0 0 0 0 0  Number falls in past yr: 0 0 0 0 0  Injury with Fall? 0 0 0 0 0  Risk for fall due to : No Fall Risks No Fall Risks     Follow up Falls prevention discussed;Falls evaluation completed Falls evaluation completed       MEDICARE RISK AT HOME:  Medicare Risk at Home Any stairs in or around the home?: Yes If so, are there any without handrails?: No Home free of loose throw rugs in walkways, pet beds, electrical cords, etc?: Yes Adequate lighting in your home to reduce risk of falls?: Yes Life alert?: No Use of a cane, walker or w/c?: No Grab bars in the bathroom?: No Shower chair or bench in shower?: No Elevated toilet seat or a handicapped toilet?: No  TIMED UP AND GO:  Was the test performed?  No  Cognitive Function: 6CIT completed        08/26/2023    9:18 AM 07/24/2022   10:49 AM 07/05/2021   10:58 AM  6CIT Screen  What Year? 0 points 0 points 0 points  What month? 0 points 0 points 0 points  What time? 0 points 0 points 0 points  Count back from 20 0 points 0 points 0  points  Months in reverse 0 points 0 points 4 points  Repeat phrase 0 points 0 points 6 points  Total Score 0 points 0 points 10 points    Immunizations Immunization History  Administered Date(s) Administered   DTaP 09/01/2015   Fluad Quad(high Dose 65+) 04/12/2020, 04/16/2021, 03/15/2022   Hep A / Hep B 02/21/2016, 04/25/2016   Influenza Inj Mdck Quad Pf 04/12/2019   Influenza,inj,Quad PF,6+ Mos 03/17/2023   Influenza-Unspecified 02/21/2016, 04/14/2017   MODERNA COVID-19 SARS-COV-2 PEDS BIVALENT BOOSTER 80yr-8yr 11/08/2021   PFIZER Comirnaty(Gray Top)Covid-19 Tri-Sucrose Vaccine 04/24/2021   PFIZER(Purple Top)SARS-COV-2 Vaccination 08/05/2019, 08/30/2019   Pneumococcal Conjugate-13 05/17/2019   Pneumococcal Polysaccharide-23 08/23/2015   Tdap 05/17/2019   Zoster Recombinant(Shingrix) 12/13/2021, 03/15/2022    Screening Tests Health Maintenance  Topic Date Due   OPHTHALMOLOGY EXAM  07/06/2020   COVID-19 Vaccine (5 - 2024-25 season) 03/02/2023   Medicare Annual Wellness (AWV)  07/25/2023   Lung Cancer Screening  12/30/2023   HEMOGLOBIN A1C  01/28/2024   Diabetic kidney evaluation - Urine ACR  02/17/2024   Diabetic kidney evaluation - eGFR measurement  07/30/2024   FOOT EXAM  07/30/2024   DTaP/Tdap/Td (3 - Td or Tdap) 05/16/2029   Pneumonia Vaccine 44+ Years old  Completed   INFLUENZA VACCINE  Completed   Hepatitis C Screening  Completed   Zoster Vaccines- Shingrix  Completed   HPV VACCINES  Aged Out    Health Maintenance  Health Maintenance Due  Topic Date Due   OPHTHALMOLOGY EXAM  07/06/2020   COVID-19 Vaccine (5 - 2024-25 season) 03/02/2023   Medicare Annual Wellness (AWV)  07/25/2023   Health Maintenance Items Addressed: See Nurse Notes  Additional Screening:  Vision Screening: Recommended annual ophthalmology exams for early detection of glaucoma and other disorders of the eye.  Dental Screening: Recommended annual dental exams for proper oral  hygiene  Community Resource Referral / Chronic Care Management: CRR required this visit?  No   CCM required this visit?  No     Plan:     I have personally reviewed and noted the following in the patient's chart:   Medical and social history Use of alcohol, tobacco or illicit drugs  Current medications and supplements including opioid prescriptions. Patient is not currently taking opioid prescriptions. Functional ability and status Nutritional status Physical activity Advanced directives List of other physicians Hospitalizations, surgeries, and ER visits in previous 12 months Vitals Screenings to include cognitive, depression, and falls Referrals and appointments  In addition, I have reviewed and discussed with patient certain preventive protocols, quality metrics, and best practice recommendations. A written personalized care plan for preventive services as well as general preventive health recommendations were provided to patient.     Jordan Hawks Murl Golladay, CMA   08/26/2023   After Visit Summary: (Mail) Due to this being a telephonic visit, the after visit summary with patients personalized plan was offered to patient via mail   Notes: Please refer to Routing Comments.

## 2023-08-26 NOTE — Patient Instructions (Signed)
 Preventive Care 77 Years and Older, Male Preventive care refers to lifestyle choices and visits with your health care provider that can promote health and wellness. Preventive care visits are also called wellness exams. What can I expect for my preventive care visit? Counseling Your health care provider may ask you questions about your: Medical history, including: Past medical problems. Family medical history. Pregnancy and menstrual history. History of falls. Current health, including: Memory and ability to understand (cognition). Emotional well-being. Home life and relationship well-being. Sexual activity and sexual health. Lifestyle, including: Alcohol, nicotine or tobacco, and drug use. Access to firearms. Diet, exercise, and sleep habits. Work and work Astronomer. Sunscreen use. Safety issues such as seatbelt and bike helmet use. Physical exam Your health care provider will check your: Height and weight. These may be used to calculate your BMI (body mass index). BMI is a measurement that tells if you are at a healthy weight. Waist circumference. This measures the distance around your waistline. This measurement also tells if you are at a healthy weight and may help predict your risk of certain diseases, such as type 2 diabetes and high blood pressure. Heart rate and blood pressure. Body temperature. Skin for abnormal spots. What immunizations do I need?  Vaccines are usually given at various ages, according to a schedule. Your health care provider will recommend vaccines for you based on your age, medical history, and lifestyle or other factors, such as travel or where you work. What tests do I need? Screening Your health care provider may recommend screening tests for certain conditions. This may include: Lipid and cholesterol levels. Hepatitis C test. Hepatitis B test. HIV (human immunodeficiency virus) test. STI (sexually transmitted infection) testing, if you are at  risk. Lung cancer screening. Colorectal cancer screening. Diabetes screening. This is done by checking your blood sugar (glucose) after you have not eaten for a while (fasting). Mammogram. Talk with your health care provider about how often you should have regular mammograms. BRCA-related cancer screening. This may be done if you have a family history of breast, ovarian, tubal, or peritoneal cancers. Bone density scan. This is done to screen for osteoporosis. Talk with your health care provider about your test results, treatment options, and if necessary, the need for more tests. Follow these instructions at home: Eating and drinking  Eat a diet that includes fresh fruits and vegetables, whole grains, lean protein, and low-fat dairy products. Limit your intake of foods with high amounts of sugar, saturated fats, and salt. Take vitamin and mineral supplements as recommended by your health care provider. Do not drink alcohol if your health care provider tells you not to drink. If you drink alcohol: Limit how much you have to 0-1 drink a day. Know how much alcohol is in your drink. In the [place] one drink equals one 12 oz bottle of beer (355 mL), one 5 oz glass of wine (148 mL), or one 1 oz glass of hard liquor (44 mL). Lifestyle Brush your teeth every morning and night with fluoride toothpaste. Floss one time each day. Exercise for at least 30 minutes 5 or more days each week. Do not use any products that contain nicotine or tobacco. These products include cigarettes, chewing tobacco, and vaping devices, such as e-cigarettes. If you need help quitting, ask your health care provider. Do not use drugs. If you are sexually active, practice safe sex. Use a condom or other form of protection in order to prevent STIs. Take aspirin only as told by  your health care provider. Make sure that you understand how much to take and what form to take. Work with your health care provider to find out whether it  is safe and beneficial for you to take aspirin daily. Ask your health care provider if you need to take a cholesterol-lowering medicine (statin). Find healthy ways to manage stress, such as: Meditation, yoga, or listening to music. Journaling. Talking to a trusted person. Spending time with friends and family. Minimize exposure to UV radiation to reduce your risk of skin cancer. Safety Always wear your seat belt while driving or riding in a vehicle. Do not drive: If you have been drinking alcohol. Do not ride with someone who has been drinking. When you are tired or distracted. While texting. If you have been using any mind-altering substances or drugs. Wear a helmet and other protective equipment during sports activities. If you have firearms in your house, make sure you follow all gun safety procedures. What's next? Visit your health care provider once a year for an annual wellness visit. Ask your health care provider how often you should have your eyes and teeth checked. Stay up to date on all vaccines. This information is not intended to replace advice given to you by your health care provider. Make sure you discuss any questions you have with your health care provider. Document Revised: 12/13/2020 Document Reviewed: 12/13/2020 Elsevier Patient Education  2024 Elsevier Inc.Mr. Alex Marks , Thank you for taking time to come for your Medicare Wellness Visit. I appreciate your ongoing commitment to your health goals. Please review the following plan we discussed and let me know if I can assist you in the future.   Referrals/Orders/Follow-Ups/Clinician Recommendations:  Next Medicare Annual Wellness Visit:   August 30, 2024 at 8:40 am telephone visit.   BLOOD GLUCOSE RANGES INFORMATION (THIS IS JUST TO GIVE YOU AN IDEA OF WHAT YOUR BLOOD GLUCOSES SHOULD BE RUNNING. PLEASE TALK WITH YOUR DOCTOR TO DEVELOP A SPECIFIC PLAN FOR TESTING AND THE RANGES THEY WOULD LIKE FOR YOU TO REMAIN  IN)           This is a list of the screening recommended for you and due dates:  Health Maintenance  Topic Date Due   Eye exam for diabetics  07/06/2020   COVID-19 Vaccine (5 - 2024-25 season) 03/02/2023   Medicare Annual Wellness Visit  07/25/2023   Screening for Lung Cancer  12/30/2023   Hemoglobin A1C  01/28/2024   Yearly kidney health urinalysis for diabetes  02/17/2024   Yearly kidney function blood test for diabetes  07/30/2024   Complete foot exam   07/30/2024   DTaP/Tdap/Td vaccine (3 - Td or Tdap) 05/16/2029   Pneumonia Vaccine  Completed   Flu Shot  Completed   Hepatitis C Screening  Completed   Zoster (Shingles) Vaccine  Completed   HPV Vaccine  Aged Out    Advanced directives: (Declined) Advance directive discussed with you today. Even though you declined this today, please call our office should you change your mind, and we can give you the proper paperwork for you to fill out.  Next Medicare Annual Wellness Visit scheduled for next year: yes  Understanding Your Risk for Falls Millions of people have serious injuries from falls each year. It is important to understand your risk of falling. Talk with your health care provider about your risk and what you can do to lower it. If you do have a serious fall, make sure to tell your provider.  Falling once raises your risk of falling again. How can falls affect me? Serious injuries from falls are common. These include: Broken bones, such as hip fractures. Head injuries, such as traumatic brain injuries (TBI) or concussions. A fear of falling can cause you to avoid activities and stay at home. This can make your muscles weaker and raise your risk for a fall. What can increase my risk? There are a number of risk factors that increase your risk for falling. The more risk factors you have, the higher your risk of falling. Serious injuries from a fall happen most often to people who are older than 77 years old. Teenagers  and young adults ages 16-29 are also at higher risk. Common risk factors include: Weakness in the lower body. Being generally weak or confused due to long-term (chronic) illness. Dizziness or balance problems. Poor vision. Medicines that cause dizziness or drowsiness. These may include: Medicines for your blood pressure, heart, anxiety, insomnia, or swelling (edema). Pain medicines. Muscle relaxants. Other risk factors include: Drinking alcohol. Having had a fall in the past. Having foot pain or wearing improper footwear. Working at a dangerous job. Having any of the following in your home: Tripping hazards, such as floor clutter or loose rugs. Poor lighting. Pets. Having dementia or memory loss. What actions can I take to lower my risk of falling?     Physical activity Stay physically fit. Do strength and balance exercises. Consider taking a regular class to build strength and balance. Yoga and tai chi are good options. Vision Have your eyes checked every year and your prescription for glasses or contacts updated as needed. Shoes and walking aids Wear non-skid shoes. Wear shoes that have rubber soles and low heels. Do not wear high heels. Do not walk around the house in socks or slippers. Use a cane or walker as told by your provider. Home safety Attach secure railings on both sides of your stairs. Install grab bars for your bathtub, shower, and toilet. Use a non-skid mat in your bathtub or shower. Attach bath mats securely with double-sided, non-slip rug tape. Use good lighting in all rooms. Keep a flashlight near your bed. Make sure there is a clear path from your bed to the bathroom. Use night-lights. Do not use throw rugs. Make sure all carpeting is taped or tacked down securely. Remove all clutter from walkways and stairways, including extension cords. Repair uneven or broken steps and floors. Avoid walking on icy or slippery surfaces. Walk on the grass instead of on  icy or slick sidewalks. Use ice melter to get rid of ice on walkways in the winter. Use a cordless phone. Questions to ask your health care provider Can you help me check my risk for a fall? Do any of my medicines make me more likely to fall? Should I take a vitamin D supplement? What exercises can I do to improve my strength and balance? Should I make an appointment to have my vision checked? Do I need a bone density test to check for weak bones (osteoporosis)? Would it help to use a cane or a walker? Where to find more information Centers for Disease Control and Prevention, STEADI: TonerPromos.no Community-Based Fall Prevention Programs: TonerPromos.no [employer] on Aging: BaseRingTones.pl Contact a health care provider if: You fall at home. You are afraid of falling at home. You feel weak, drowsy, or dizzy. This information is not intended to replace advice given to you by your health care provider. Make sure you discuss  any questions you have with your health care provider. Document Revised: 02/18/2022 Document Reviewed: 02/18/2022 Elsevier Patient Education  2024 [employer].

## 2023-08-28 ENCOUNTER — Ambulatory Visit: Payer: Self-pay | Admitting: Internal Medicine

## 2023-08-28 NOTE — Telephone Encounter (Signed)
 Called for advice regarding bgm. Want's to know what a normal bgl was.  Education given, instructed to keep a log of bgl and to bring them with him to the next apt.

## 2023-08-29 NOTE — Addendum Note (Signed)
 Addended by: Recardo Evangelist A on: 08/29/2023 03:59 PM   Modules accepted: Orders

## 2023-09-01 ENCOUNTER — Telehealth: Payer: Self-pay | Admitting: *Deleted

## 2023-09-01 NOTE — Progress Notes (Signed)
 Complex Care Management Note Care Guide Note  09/01/2023 Name: Alex Marks MRN: [id number] DOB: Aug 28, 1945   Complex Care Management Outreach Attempts: An unsuccessful telephone outreach was attempted today to offer the patient information about available complex care management services.  Follow Up Plan:  Additional outreach attempts will be made to offer the patient complex care management information and services.   Encounter Outcome:  No Answer  Gwenevere Ghazi  [facility]  [facility], [facility] Guide  Direct Dial: [phone]  Fax [phone]

## 2023-09-01 NOTE — Progress Notes (Signed)
 Complex Care Management Note  Care Guide Note 09/01/2023 Name: Alex Marks MRN: [id number] DOB: 1945-11-05  Alex Marks is a 78 y.o. year old male who sees Anabel Halon, MD for primary care. I reached out to Alex Marks by phone today to offer complex care management services.  Alex Marks was given information about Complex Care Management services today including:   The Complex Care Management services include support from the care team which includes your Nurse Care Manager, Clinical Social Worker, or Pharmacist.  The Complex Care Management team is here to help remove barriers to the health concerns and goals most important to you. Complex Care Management services are voluntary, and the patient may decline or stop services at any time by request to their care team member.   Complex Care Management Consent Status: Patient agreed to services and verbal consent obtained.   Follow up plan:  Telephone appointment with complex care management team member scheduled for:  3/10 and 3/24  Encounter Outcome:  Patient Scheduled  Gwenevere Ghazi  [facility]  [facility], [facility] Guide  Direct Dial: [phone]  Fax [phone]

## 2023-09-08 ENCOUNTER — Ambulatory Visit: Payer: Self-pay

## 2023-09-08 NOTE — Patient Instructions (Signed)
 Visit Information  Thank you for taking time to visit with me today. Please don't hesitate to contact me if I can be of assistance to you.   Following are the goals we discussed today:  Patient will continue to have regular contact with friends.     If you are experiencing a Mental Health or Behavioral Health Crisis or need someone to talk to, please call 911  Patient verbalizes understanding of instructions and care plan provided today and agrees to view in MyChart. Active MyChart status and patient understanding of how to access instructions and care plan via MyChart confirmed with patient.     No further follow up required: Patient has does not request a follow up visit.   Lysle Morales, BSW [place]  [facility], [facility] Social Worker Direct Dial: [phone]  Fax: [phone] Website: [email or web address]

## 2023-09-08 NOTE — Patient Outreach (Signed)
 Care Coordination   Initial Visit Note   09/08/2023 Name: Alex Marks MRN: [id number] DOB: August 02, 1945  Alex Marks is a 78 y.o. year old male who sees Anabel Halon, MD for primary care. I spoke with  Alex Marks by phone today.  What matters to the patients health and wellness today?  Patient lives in isolation but is not interested in changing his situation.  Patient has family out of town but he does not have a close relationship.    Goals Addressed             This Visit's Progress    Care Coordination Activities       Interventions Today    Flowsheet Row Most Recent Value  Chronic Disease   Chronic disease during today's visit Diabetes, Hypertension (HTN), Chronic Kidney Disease/End Stage Renal Disease (ESRD)  General Interventions   General Interventions Discussed/Reviewed General Interventions Discussed, General Interventions Reviewed  [Pt reports he has a friend in [place] & his hometown that call wkly.Pt prefers limited contact w/others.Pt declines day programs.Pt has no unmet needs.]  Mental Health Interventions   Mental Health Discussed/Reviewed Mental Health Discussed  [Pt acknowledges depression and anxiety.Pt has received counseling in the past.Pt declines resuming counseling.]              SDOH assessments and interventions completed:  Yes  SDOH Interventions Today    Flowsheet Row Most Recent Value  SDOH Interventions   Food Insecurity Interventions Intervention Not Indicated  [Food stamps and insurance extra benefit]  Housing Interventions Intervention Not Indicated  Transportation Interventions SCAT (Specialized Community Area Transporation)  Utilities Interventions Intervention Not Indicated  Social Connections Interventions Intervention Not Indicated  [Does not want social group or day program]        Care Coordination Interventions:  Yes, provided   Follow up plan: No further intervention required.   Encounter Outcome:  Patient Visit  Completed

## 2023-09-21 ENCOUNTER — Other Ambulatory Visit: Payer: Self-pay | Admitting: Internal Medicine

## 2023-09-21 DIAGNOSIS — E1169 Type 2 diabetes mellitus with other specified complication: Secondary | ICD-10-CM

## 2023-09-22 ENCOUNTER — Other Ambulatory Visit: Payer: Self-pay | Admitting: *Deleted

## 2023-09-22 ENCOUNTER — Encounter: Payer: Self-pay | Admitting: *Deleted

## 2023-09-22 NOTE — Patient Instructions (Signed)
 Care Management   Initial Visit Note  09/22/2023 Name: Alex Marks MRN: [id number] DOB: [birth year]  Alex Marks is enrolled in a Managed Medicaid plan: No. Outreach attempt today was successful.   Subjective:   Objective:  Assessment: [name] is a 78 y.o. year old male who sees Anabel Halon, MD for primary care. The care management team was consulted for assistance with care management and care coordination needs related to Disease Management.   Review of patient status, including review of consultants reports, relevant laboratory and other test results, and collaboration with appropriate care team members and the patient's provider was performed as part of comprehensive patient evaluation and provision of care management services.    SDOH (Social Determinants of Health) screening performed today. See Care Plan Entry related to challenges with: Transportation, Social Connections, and Tobacco Use   Goals Addressed             This Visit's Progress    RNCM Care Management Expected Outcomes: Monitor, Self-Manage and Reduce Symptoms of: DM       Current Barriers:  Knowledge Deficits related to plan of care for management of DMII  Chronic Disease Management support and education needs related to DMII   RNCM Clinical Goal(s):  Patient will verbalize basic understanding of  DMII disease process and self health management plan as evidenced by verbal explanation, recognizing symptoms and lifestyle modifications take all medications exactly as prescribed and will call provider for medication related questions as evidenced by compliance with all medications attend all scheduled medical appointments: with primary care provider and specialists as evidenced by keeping all scheduled appointments demonstrate Improved and Ongoing adherence to prescribed treatment plan for DMII as evidenced by consistent medication compliance, symptom monitoring, continued lifestyle changes,  adherence to treatment plan continue to work with RN Care Manager to address care management and care coordination needs related to  DMII as evidenced by adherence to CM Team Scheduled appointments through collaboration with RN Care manager, provider, and care team.   Interventions: Evaluation of current treatment plan related to  self management and patient's adherence to plan as established by provider   Diabetes Interventions:  (Status:  New goal. and Goal on track:  Yes.) Long Term Goal Assessed patient's understanding of A1c goal: <7% Provided education to patient about basic DM disease process. Patient reports checking blood sugar but only checks 2-3 times per week as daily checking is overwhelming for him. RNCM reviewed with patient the importance of daily monitoring and keeping a record of his readings. He reports a fasting glucose this morning of 129 with the highest reading of 179 and lowest 121. RNCM reviewed with patient goal fasting <130 and post prandial <180, patient verbalized full understanding. Reviewed medications with patient and discussed importance of medication adherence. Reports compliance with all medications Counseled on importance of regular laboratory monitoring as prescribed. Labs up to date. Discussed plans with patient for ongoing care management follow up and provided patient with direct contact information for care management team Provided patient with written educational materials related to hypo and hyperglycemia and importance of correct treatment. Denies any hypo/hyperglycemia at this time. Education proivded. Reviewed scheduled/upcoming provider appointments including: 10-28-2023 with PCP Advised patient, providing education and rationale, to check cbg at minimum daily and record, calling provider for findings outside established parameters. Does not check daily, education provided. Review of patient status, including review of consultants reports, relevant  laboratory and other test results, and medications completed Screening for  signs and symptoms of depression related to chronic disease state  Assessed social determinant of health barriers Eye exam scheduled for 10-22-23 Lab Results  Component Value Date   HGBA1C 7.0 (H) 07/31/2023    Patient Goals/Self-Care Activities: Take all medications as prescribed Attend all scheduled provider appointments Call pharmacy for medication refills 3-7 days in advance of running out of medications Attend church or other social activities Perform all self care activities independently  Perform IADL's (shopping, preparing meals, housekeeping, managing finances) independently Call provider office for new concerns or questions  keep appointment with eye doctor check blood sugar at prescribed times: once daily and when you have symptoms of low or high blood sugar check feet daily for cuts, sores or redness enter blood sugar readings and medication or insulin into daily log drink 6 to 8 glasses of water each day fill half of plate with vegetables wash and dry feet carefully every day wear comfortable, cotton socks wear comfortable, well-fitting shoes  Follow Up Plan:  Telephone follow up appointment with care management team member scheduled for:  10-06-2023 at 9:00 am           Follow up plan:  Telephone follow up appointment with care management team member scheduled for:10-06-2023 at 9:00 am  Mr. Powe was given information about Care Management services today including:  Care Management services include personalized support from designated clinical staff supervised by a physician, including individualized plan of care and coordination with other care providers 24/7 contact phone numbers for assistance for urgent and routine care needs. The patient may stop CCM services at any time (effective at the end of the month) by phone call to the office staff.  Patient agreed to services and verbal consent  obtained.  Larey Brick, BSN RN [facility], [facility] RN Care Manager Direct Dial: [phone]  Fax: [phone]

## 2023-09-22 NOTE — Patient Outreach (Signed)
 Care Management   Visit Note  09/22/2023 Name: Alex Marks MRN: [id number] DOB: Sep 12, 1945  Subjective: Alex Marks is a 78 y.o. year old male who is a primary care patient of Anabel Halon, MD. The Care Management team was consulted for assistance.      Engaged with patient spoke with patient by telephone.    Goals Addressed             This Visit's Progress    RNCM Care Management Expected Outcomes: Monitor, Self-Manage and Reduce Symptoms of: DM       Current Barriers:  Knowledge Deficits related to plan of care for management of DMII  Chronic Disease Management support and education needs related to DMII   RNCM Clinical Goal(s):  Patient will verbalize basic understanding of  DMII disease process and self health management plan as evidenced by verbal explanation, recognizing symptoms and lifestyle modifications take all medications exactly as prescribed and will call provider for medication related questions as evidenced by compliance with all medications attend all scheduled medical appointments: with primary care provider and specialists as evidenced by keeping all scheduled appointments demonstrate Improved and Ongoing adherence to prescribed treatment plan for DMII as evidenced by consistent medication compliance, symptom monitoring, continued lifestyle changes, adherence to treatment plan continue to work with RN Care Manager to address care management and care coordination needs related to  DMII as evidenced by adherence to CM Team Scheduled appointments through collaboration with RN Care manager, provider, and care team.   Interventions: Evaluation of current treatment plan related to  self management and patient's adherence to plan as established by provider   Diabetes Interventions:  (Status:  New goal. and Goal on track:  Yes.) Long Term Goal Assessed patient's understanding of A1c goal: <7% Provided education to patient about basic DM disease process.  Patient reports checking blood sugar but only checks 2-3 times per week as daily checking is overwhelming for him. RNCM reviewed with patient the importance of daily monitoring and keeping a record of his readings. He reports a fasting glucose this morning of 129 with the highest reading of 179 and lowest 121. RNCM reviewed with patient goal fasting <130 and post prandial <180, patient verbalized full understanding. Reviewed medications with patient and discussed importance of medication adherence. Reports compliance with all medications Counseled on importance of regular laboratory monitoring as prescribed. Labs up to date. Discussed plans with patient for ongoing care management follow up and provided patient with direct contact information for care management team Provided patient with written educational materials related to hypo and hyperglycemia and importance of correct treatment. Denies any hypo/hyperglycemia at this time. Education proivded. Reviewed scheduled/upcoming provider appointments including: 10-28-2023 with PCP Advised patient, providing education and rationale, to check cbg at minimum daily and record, calling provider for findings outside established parameters. Does not check daily, education provided. Review of patient status, including review of consultants reports, relevant laboratory and other test results, and medications completed Screening for signs and symptoms of depression related to chronic disease state  Assessed social determinant of health barriers Eye exam scheduled for 10-22-23 Lab Results  Component Value Date   HGBA1C 7.0 (H) 07/31/2023    Patient Goals/Self-Care Activities: Take all medications as prescribed Attend all scheduled provider appointments Call pharmacy for medication refills 3-7 days in advance of running out of medications Attend church or other social activities Perform all self care activities independently  Perform IADL's (shopping,  preparing meals, housekeeping, managing finances) independently  Call provider office for new concerns or questions  keep appointment with eye doctor check blood sugar at prescribed times: once daily and when you have symptoms of low or high blood sugar check feet daily for cuts, sores or redness enter blood sugar readings and medication or insulin into daily log drink 6 to 8 glasses of water each day fill half of plate with vegetables wash and dry feet carefully every day wear comfortable, cotton socks wear comfortable, well-fitting shoes  Follow Up Plan:  Telephone follow up appointment with care management team member scheduled for:  10-06-2023 at 9:00 am             Consent to Services:  Patient was given information about care management services, agreed to services, and gave verbal consent to participate.   Plan: Telephone follow up appointment with care management team member scheduled for:10-06-2023 at 9:00 am  Larey Brick, BSN RN [facility], [facility] RN Care Manager Direct Dial: [phone]  Fax: [phone]

## 2023-10-06 ENCOUNTER — Encounter: Payer: Self-pay | Admitting: *Deleted

## 2023-10-06 ENCOUNTER — Other Ambulatory Visit: Payer: Self-pay | Admitting: *Deleted

## 2023-10-06 ENCOUNTER — Other Ambulatory Visit: Admitting: *Deleted

## 2023-10-06 ENCOUNTER — Other Ambulatory Visit: Payer: Self-pay

## 2023-10-06 NOTE — Patient Instructions (Signed)
 Visit Information  Thank you for taking time to visit with me today. Please don't hesitate to contact me if I can be of assistance to you before our next scheduled appointment.  Your next care management appointment is by telephone on 11-04-2023 at 9:00 am  Telephone follow-up in 1 month  Please call the care guide team at [phone] if you need to cancel, schedule, or reschedule an appointment.   Please call the Suicide and Crisis Lifeline: 988 call the [place] National Suicide Prevention Lifeline: 305-543-1331 or TTY: 670-811-1904 TTY 218-688-9426) to talk to a trained counselor call 1-800-273-TALK (toll free, 24 hour hotline) call the [facility]: 639-516-6519 if you are experiencing a Mental Health or Behavioral Health Crisis or need someone to talk to.  Danise Edge, BSN RN [facility], [facility] RN Care Manager Direct Dial: [phone]  Fax: [phone]

## 2023-10-06 NOTE — Patient Outreach (Signed)
 Complex Care Management   Visit Note  10/06/2023  Name:  Alex Marks MRN: [id number] DOB: 1946/06/21  Situation: Referral received for Complex Care Management related to Diabetes with Complications and SDOH Barriers:  Social Isolation Tobacco Use Physical Activity I obtained verbal consent from patient.  Visit completed with patient  on the phone  Background:   Past Medical History:  Diagnosis Date   Anxiety    COPD (chronic obstructive pulmonary disease) (HCC)    Dark stools 07/08/2019   Depression    Depression    Frequency of urination    GAD (generalized anxiety disorder)    GERD (gastroesophageal reflux disease)    Hepatitis C    s/p treatment with Dr. Dulce Sellar in 2017. HCV RNA not detected in April 2021   Hypercholesterolemia    Hypertension    PAD (peripheral artery disease) (HCC)    Scalp lesion 04/12/2019   Substance abuse (HCC)    Transfusion of blood product refused for religious reason    Type II diabetes mellitus (HCC)     Assessment: Patient Reported Symptoms:  Cognitive Alert and oriented to person, place, and time  Neurological No symptoms reported    HEENT      Cardiovascular No symptoms reported    Respiratory Not assesed    Endocrine No symptoms reported    Gastrointestinal No symptoms reported    Genitourinary No symptoms reported    Integumentary No symptoms reported    Musculoskeletal Unsteady gait    Psychosocial       There were no vitals filed for this visit.  Medications Reviewed Today     Reviewed by Ricky Stabs, RN (Registered Nurse) on 10/06/23 at 0930  Med List Status: <None>   Medication Order Taking? Sig Documenting Provider Last Dose Status Informant  amLODipine-olmesartan (AZOR) 10-40 MG tablet [id number] Yes Take 1 tablet by mouth daily. Anabel Halon, MD Taking Active   aspirin 81 MG chewable tablet [id number] Yes Chew 1 tablet by mouth daily. [provider] Taking Active   atorvastatin (LIPITOR) 20 MG  tablet [id number] Yes Take 1 tablet (20 mg total) by mouth daily. Anabel Halon, MD Taking Active   Blood Glucose Monitoring Suppl DEVI [id number] Yes 1 each by Does not apply route in the morning, at noon, and at bedtime. May substitute to any manufacturer covered by patient's insurance. Anabel Halon, MD Taking Active   buPROPion (WELLBUTRIN XL) 150 MG 24 hr tablet [id number] Yes Take 1 tablet (150 mg total) by mouth daily. Anabel Halon, MD Taking Active   Carboxymethylcell-Glycerin PF (REFRESH OPTIVE PF) 0.5-0.9 % SOLN [id number] Yes Place 2 drops into both eyes daily as needed (Irritation). [provider] Taking Active Self  Cholecalciferol (VITAMIN D3) 25 MCG (1000 UT) CAPS [id number] Yes Take 1 capsule (1,000 Units total) by mouth in the morning and at bedtime. Elenore Paddy, NP Taking Active            Med Note Sullivan Lone, Demarie Hyneman M   Mon Oct 06, 2023  9:24 AM) Patient taking differently. Takes one capsule daily  cilostazol (PLETAL) 100 MG tablet [id number] Yes Take 1 tablet (100 mg total) by mouth 2 (two) times daily before a meal. Victorino Sparrow, MD Taking Active   erythromycin ophthalmic ointment [id number] No Place a 1/2 inch ribbon of ointment into the left lower eyelid BID prn.  Patient not taking: Reported on 10/06/2023   Particia Nearing, PA-C Not Taking Consider Medication Status  and Discontinue   fexofenadine (ALLEGRA) 180 MG tablet [id number] No Take 180 mg by mouth daily.  Patient not taking: Reported on 09/22/2023   [provider] Not Taking Consider Medication Status and Discontinue Self  glipiZIDE (GLUCOTROL) 5 MG tablet [id number] Yes Take 1 tablet (5 mg total) by mouth 2 (two) times daily before a meal. Anabel Halon, MD Taking Active   Glucose Blood (BLOOD GLUCOSE TEST STRIPS) STRP [id number] Yes 1 each by In Vitro route in the morning, at noon, and at bedtime. May substitute to any manufacturer covered by patient's insurance. Anabel Halon, MD Taking  Active   JARDIANCE 10 MG TABS tablet [id number] Yes TAKE 1 TABLET(10 MG) BY MOUTH DAILY BEFORE BREAKFAST Anabel Halon, MD Taking Active   Lancets Misc. MISC [id number] Yes 1 each by Does not apply route in the morning, at noon, and at bedtime. May substitute to any manufacturer covered by patient's insurance. Anabel Halon, MD Taking Active   loratadine (CLARITIN) 10 MG tablet [id number] Yes Take 10 mg by mouth daily. [provider] Taking Active Self  metFORMIN (GLUCOPHAGE) 500 MG tablet [id number] Yes Take 1 tablet (500 mg total) by mouth daily with breakfast. Anabel Halon, MD Taking Active   Multiple Vitamin (MULTIVITAMIN WITH MINERALS) TABS tablet [id number] Yes Take 1 tablet by mouth daily. [employer] copy, Historical, MD Taking Active Self  olopatadine (PATADAY) 0.1 % ophthalmic solution [id number] No Place 1 drop into both eyes 2 (two) times daily.  Patient not taking: Reported on 10/06/2023   Particia Nearing, PA-C Not Taking Consider Medication Status and Discontinue   Omega-3 Fatty Acids (FISH OIL PEARLS) 300 MG CAPS [id number] Yes Take 600 mg by mouth 2 (two) times daily.  Patient taking differently: Take 1,000 mg by mouth at bedtime.   Wilson Singer, MD Taking Active Self  oxymetazoline (NASAL SPRAY MOISTURIZING 12 HR) 0.05 % nasal spray [id number] No Place 1 spray into both nostrils daily as needed for congestion.  Patient not taking: Reported on 09/22/2023   [provider] Not Taking Consider Medication Status and Discontinue Self  vitamin C (ASCORBIC ACID) 500 MG tablet [id number] Yes Take 500 mg by mouth at bedtime. [provider] Taking Active Self            Recommendation:   PCP Follow-up  Follow Up Plan:   Telephone follow-up in 1 month  Danise Edge, BSN RN [facility], [facility] RN Care Manager Direct Dial: [phone]  Fax: [phone]

## 2023-10-09 ENCOUNTER — Ambulatory Visit: Payer: Medicare HMO | Admitting: Vascular Surgery

## 2023-10-09 ENCOUNTER — Ambulatory Visit (HOSPITAL_COMMUNITY): Payer: Medicare HMO

## 2023-10-28 ENCOUNTER — Encounter: Payer: Self-pay | Admitting: Internal Medicine

## 2023-10-28 ENCOUNTER — Ambulatory Visit (INDEPENDENT_AMBULATORY_CARE_PROVIDER_SITE_OTHER): Payer: 59 | Admitting: Internal Medicine

## 2023-10-28 VITALS — BP 113/70 | HR 74 | Ht 70.0 in | Wt 224.8 lb

## 2023-10-28 DIAGNOSIS — N1831 Chronic kidney disease, stage 3a: Secondary | ICD-10-CM | POA: Diagnosis not present

## 2023-10-28 DIAGNOSIS — E782 Mixed hyperlipidemia: Secondary | ICD-10-CM | POA: Diagnosis not present

## 2023-10-28 DIAGNOSIS — I739 Peripheral vascular disease, unspecified: Secondary | ICD-10-CM | POA: Diagnosis not present

## 2023-10-28 DIAGNOSIS — I10 Essential (primary) hypertension: Secondary | ICD-10-CM

## 2023-10-28 DIAGNOSIS — E1159 Type 2 diabetes mellitus with other circulatory complications: Secondary | ICD-10-CM | POA: Diagnosis not present

## 2023-10-28 DIAGNOSIS — Z0001 Encounter for general adult medical examination with abnormal findings: Secondary | ICD-10-CM | POA: Diagnosis not present

## 2023-10-28 DIAGNOSIS — J432 Centrilobular emphysema: Secondary | ICD-10-CM | POA: Diagnosis not present

## 2023-10-28 DIAGNOSIS — Z7984 Long term (current) use of oral hypoglycemic drugs: Secondary | ICD-10-CM | POA: Diagnosis not present

## 2023-10-28 DIAGNOSIS — Z125 Encounter for screening for malignant neoplasm of prostate: Secondary | ICD-10-CM

## 2023-10-28 DIAGNOSIS — F330 Major depressive disorder, recurrent, mild: Secondary | ICD-10-CM

## 2023-10-28 MED ORDER — BUPROPION HCL ER (XL) 150 MG PO TB24
150.0000 mg | ORAL_TABLET | Freq: Every day | ORAL | 1 refills | Status: DC
Start: 2023-10-28 — End: 2024-02-27

## 2023-10-28 NOTE — Progress Notes (Signed)
 Established Patient Office Visit  Subjective:  Patient ID: Alex Marks, male    DOB: 03/05/1946  Age: 78 y.o. MRN: [id number]  CC:  Chief Complaint  Patient presents with   Annual Exam    Annual exam/ 3 month f/u     HPI Alex Marks is a 78 y.o. male with past medical history of HTN, PVD, hep C, type II DM, HLD and depression who presents for annual physical.  PVD:  He saw vascular surgeon in 10/24. He was placed on Pletal  for it.  He is taking aspirin  and statin. He improvement in bilateral leg weakness and pain upon walking.  He also has gait disturbance.  Denies any numbness or tingling currently.  He has weak DPA pulse. He had US  ABI done, which showed bilateral mild to moderate PAD.  He has also started taking fish oil  and states that it has been helping with his leg weakness (?).  HTN: BP is well-controlled. Takes amlodipine -olmesartan  10-40 mg QD regularly. Patient denies headache, dizziness, chest pain, dyspnea or palpitations.  Type II DM: He takes metformin , glipizide  and Jardiance  for it.  His last Hgb A1c was 7.0 in 01/25. He checks blood glucose at home now, usually between 100-130. He denies any fatigue, polyuria or polydipsia.  He reports chronic fatigue.  CKD: Last CMP showed GFR of 59, overall stable.  Denies any dysuria, hematuria, urinary hesitance or resistance.  MDD: He had anhedonia, anxiety/agitation and feeling lonely.  He has been taking Wellbutrin  regularly, and reports that it is helping him. He has tried multiple antidepressants in the past, but details are unknown. He reports that he had better response with Valium in the past.  He currently lives alone and does not have much support from family or friends.   Past Medical History:  Diagnosis Date   Anxiety    COPD (chronic obstructive pulmonary disease) (HCC)    Dark stools 07/08/2019   Depression    Depression    Frequency of urination    GAD (generalized anxiety disorder)    GERD  (gastroesophageal reflux disease)    Hepatitis C    s/p treatment with Dr. Kimble Pennant in 2017. HCV RNA not detected in April 2021   Hypercholesterolemia    Hypertension    PAD (peripheral artery disease) (HCC)    Scalp lesion 04/12/2019   Substance abuse (HCC)    Transfusion of blood product refused for religious reason    Type II diabetes mellitus [facility])     Past Surgical History:  Procedure Laterality Date   ABDOMINAL AORTOGRAM W/LOWER EXTREMITY Bilateral 05/30/2017   ABDOMINAL AORTOGRAM W/LOWER EXTREMITY N/A 05/30/2017   Procedure: ABDOMINAL AORTOGRAM W/LOWER EXTREMITY;  Surgeon: Richrd Char, MD;  Location: [facility];  Service: Cardiovascular;  Laterality: N/A;  Bilateral    Family History  Problem Relation Age of Onset   Alzheimer's disease Mother    Mental illness Mother        Depression   Breast cancer Mother    Cancer Father        bladder?   Hypertension Father    Alcohol abuse Father    Heart disease Sister    Hearing loss Sister        valve   Alcohol abuse Brother    Diabetes Paternal Aunt    COPD Sister    Alcohol abuse Brother    Alcohol abuse Brother    Alcohol abuse Brother    Early death Brother  MVA   Cancer Maternal Grandmother    Colon cancer Neg Hx    Liver cancer Neg Hx     Social History   Socioeconomic History   Marital status: Single    Spouse name: Not on file   Number of children: 0   Years of education: 11   Highest education level: Not on file  Occupational History   Occupation: retired    Comment: labor  Tobacco Use   Smoking status: Every Day    Current packs/day: 0.00    Average packs/day: 1 pack/day for 60.0 years (60.0 ttl pk-yrs)    Types: Cigarettes    Start date: 07/01/1961    Last attempt to quit: 01/29/2021    Years since quitting: 2.7   Smokeless tobacco: Never   Tobacco comments:    Has tried to quit, but his depressed mood makes it challenging to quit 1 ppd as of 10/28/23  Vaping Use   Vaping  status: Never Used  Substance and Sexual Activity   Alcohol use: Not Currently    Comment: history of heavy alcohol use; last drank alcohol about 3 years ago   Drug use: Not Currently    Types: Marijuana, Cocaine, Heroin, "Crack" cocaine    Comment: Last used heroin in his 24s. Last used cocaine and marijuana about 3 years ago.    Sexual activity: Not Currently  Other Topics Concern   Not on file  Social History Narrative   Single.Live in apartment home   Never had license   Likes to read   Social Drivers of Health   Financial Resource Strain: Low Risk  (09/22/2023)   Overall Financial Resource Strain (CARDIA)    Difficulty of Paying Living Expenses: Not hard at all  Food Insecurity: No Food Insecurity (09/22/2023)   Hunger Vital Sign    Worried About Running Out of Food in the Last Year: Never true    Ran Out of Food in the Last Year: Never true  Transportation Needs: Unmet Transportation Needs (09/22/2023)   PRAPARE - Transportation    Lack of Transportation (Medical): Yes    Lack of Transportation (Non-Medical): Yes  Physical Activity: Inactive (09/22/2023)   Exercise Vital Sign    Days of Exercise per Week: 0 days    Minutes of Exercise per Session: 0 min  Stress: No Stress Concern Present (09/22/2023)   [employer] of Occupational Health - Occupational Stress Questionnaire    Feeling of Stress : Only a little  Social Connections: Socially Isolated (09/22/2023)   Social Connection and Isolation Panel [NHANES]    Frequency of Communication with Friends and Family: Twice a week    Frequency of Social Gatherings with Friends and Family: Never    Attends Religious Services: Never    Database administrator or Organizations: No    Attends Banker Meetings: Never    Marital Status: Never married  Intimate Partner Violence: Not At Risk (09/22/2023)   Humiliation, Afraid, Rape, and Kick questionnaire    Fear of Current or Ex-Partner: No    Emotionally Abused: No     Physically Abused: No    Sexually Abused: No    Outpatient Medications Prior to Visit  Medication Sig Dispense Refill   amLODipine -olmesartan  (AZOR ) 10-40 MG tablet Take 1 tablet by mouth daily. 100 tablet 3   aspirin  81 MG chewable tablet Chew 1 tablet by mouth daily.     atorvastatin  (LIPITOR) 20 MG tablet Take 1 tablet (20 mg total) by  mouth daily. 90 tablet 1   Blood Glucose Monitoring Suppl DEVI 1 each by Does not apply route in the morning, at noon, and at bedtime. May substitute to any manufacturer covered by patient's insurance. 1 each 0   Carboxymethylcell-Glycerin PF (REFRESH OPTIVE PF) 0.5-0.9 % SOLN Place 2 drops into both eyes daily as needed (Irritation).     Cholecalciferol  (VITAMIN D3) 25 MCG (1000 UT) CAPS Take 1 capsule (1,000 Units total) by mouth in the morning and at bedtime. 60 capsule 0   cilostazol  (PLETAL ) 100 MG tablet Take 1 tablet (100 mg total) by mouth 2 (two) times daily before a meal. 60 tablet 11   erythromycin  ophthalmic ointment Place a 1/2 inch ribbon of ointment into the left lower eyelid BID prn. 3.5 g 0   fexofenadine (ALLEGRA) 180 MG tablet Take 180 mg by mouth daily.     glipiZIDE  (GLUCOTROL ) 5 MG tablet Take 1 tablet (5 mg total) by mouth 2 (two) times daily before a meal. 180 tablet 1   Glucose Blood (BLOOD GLUCOSE TEST STRIPS) STRP 1 each by In Vitro route in the morning, at noon, and at bedtime. May substitute to any manufacturer covered by patient's insurance. 100 strip 3   JARDIANCE  10 MG TABS tablet TAKE 1 TABLET(10 MG) BY MOUTH DAILY BEFORE BREAKFAST 30 tablet 3   Lancets Misc. MISC 1 each by Does not apply route in the morning, at noon, and at bedtime. May substitute to any manufacturer covered by patient's insurance. 100 each 3   loratadine (CLARITIN) 10 MG tablet Take 10 mg by mouth daily.     metFORMIN  (GLUCOPHAGE ) 500 MG tablet Take 1 tablet (500 mg total) by mouth daily with breakfast. 180 tablet 3   Multiple Vitamin (MULTIVITAMIN WITH  MINERALS) TABS tablet Take 1 tablet by mouth daily. [facility]     olopatadine  (PATADAY ) 0.1 % ophthalmic solution Place 1 drop into both eyes 2 (two) times daily. 5 mL 0   Omega-3 Fatty Acids (FISH OIL  PEARLS) 300 MG CAPS Take 600 mg by mouth 2 (two) times daily. (Patient taking differently: Take 1,000 mg by mouth at bedtime.) 60 capsule 3   oxymetazoline (NASAL SPRAY MOISTURIZING 12 HR) 0.05 % nasal spray Place 1 spray into both nostrils daily as needed for congestion.     vitamin C (ASCORBIC ACID) 500 MG tablet Take 500 mg by mouth at bedtime.     buPROPion  (WELLBUTRIN  XL) 150 MG 24 hr tablet Take 1 tablet (150 mg total) by mouth daily. 30 tablet 3   No facility-administered medications prior to visit.    Allergies  Allergen Reactions   Onion     Bags under eyes, indigestion    Other Swelling    Hot sauce -- causes eye swelling Spicy food    ROS Review of Systems  Constitutional:  Positive for fatigue. Negative for chills and fever.  HENT:  Negative for congestion and sore throat.   Eyes:  Negative for pain and discharge.  Respiratory:  Negative for cough and shortness of breath.   Cardiovascular:  Negative for chest pain and palpitations.  Gastrointestinal:  Negative for diarrhea, nausea and vomiting.  Endocrine: Negative for polydipsia and polyuria.  Genitourinary:  Negative for dysuria and hematuria.  Musculoskeletal:  Positive for arthralgias, gait problem and myalgias. Negative for neck pain and neck stiffness.  Skin:  Negative for rash.  Neurological:  Negative for dizziness, weakness, numbness and headaches.  Psychiatric/Behavioral:  Positive for sleep disturbance. Negative for  agitation, behavioral problems and suicidal ideas. The patient is nervous/anxious.       Objective:    Physical Exam Vitals reviewed.  Constitutional:      General: He is not in acute distress.    Appearance: He is not diaphoretic.  HENT:     Head: Normocephalic and atraumatic.      Nose: Nose normal.     Mouth/Throat:     Mouth: Mucous membranes are moist.  Eyes:     General: No scleral icterus.    Extraocular Movements: Extraocular movements intact.  Cardiovascular:     Rate and Rhythm: Normal rate and regular rhythm.     Heart sounds: Normal heart sounds. No murmur heard. Pulmonary:     Breath sounds: Normal breath sounds. No wheezing or rales.  Abdominal:     Palpations: Abdomen is soft.     Tenderness: There is no abdominal tenderness.  Musculoskeletal:     Cervical back: Neck supple. No tenderness.     Right lower leg: No edema.     Left lower leg: No edema.  Skin:    General: Skin is warm.     Findings: No rash.  Neurological:     General: No focal deficit present.     Mental Status: He is alert and oriented to person, place, and time. Mental status is at baseline.     Cranial Nerves: No cranial nerve deficit.     Sensory: No sensory deficit.     Motor: Weakness (4/5 in b/l LE) present.  Psychiatric:        Mood and Affect: Mood normal.        Behavior: Behavior normal.     BP 113/70   Pulse 74   Ht 5\' 10"  (1.778 m)   Wt 224 lb 12.8 oz (102 kg)   SpO2 93%   BMI 32.26 kg/m  Wt Readings from Last 3 Encounters:  10/28/23 224 lb 12.8 oz (102 kg)  08/26/23 227 lb (103 kg)  07/31/23 227 lb 6.4 oz (103.1 kg)    Lab Results  Component Value Date   TSH 4.030 07/31/2023   Lab Results  Component Value Date   WBC 10.6 07/31/2023   HGB 16.1 07/31/2023   HCT 48.2 07/31/2023   MCV 90 07/31/2023   PLT 256 07/31/2023   Lab Results  Component Value Date   NA 139 07/31/2023   K 4.3 07/31/2023   CO2 17 (L) 07/31/2023   GLUCOSE 147 (H) 07/31/2023   BUN 16 07/31/2023   CREATININE 1.48 (H) 07/31/2023   BILITOT 0.3 07/31/2023   ALKPHOS 149 (H) 07/31/2023   AST 23 07/31/2023   ALT 31 07/31/2023   PROT 7.0 07/31/2023   ALBUMIN 4.2 07/31/2023   CALCIUM  9.4 07/31/2023   ANIONGAP 11 11/16/2019   EGFR 49 (L) 07/31/2023   Lab Results   Component Value Date   CHOL 124 09/02/2022   Lab Results  Component Value Date   HDL 35 (L) 09/02/2022   Lab Results  Component Value Date   LDLCALC 72 09/02/2022   Lab Results  Component Value Date   TRIG 90 09/02/2022   Lab Results  Component Value Date   CHOLHDL 3.5 09/02/2022   Lab Results  Component Value Date   HGBA1C 7.0 (H) 07/31/2023      Assessment & Plan:   Problem List Items Addressed This Visit       Cardiovascular and Mediastinum   Essential hypertension   BP Readings  from Last 1 Encounters:  10/28/23 113/70   Well-controlled with amlodipine -olmesartan  10-40 mg QD now Counseled for compliance with the medications Advised DASH diet and moderate exercise/walking, at least 150 mins/week      PAD (peripheral artery disease) (HCC)   On aspirin  and statin Has claudication symptoms Has weak pulse on DPA Checked US  ABI Vascular surgery referral provided - started Cilostazol         Respiratory   Centrilobular emphysema (HCC)   Noted on CT chest Has been trying to cut down smoking Asymptomatic currently        Endocrine   Type 2 diabetes mellitus (HCC)   Lab Results  Component Value Date   HGBA1C 7.0 (H) 07/31/2023   Well-controlled Associated with PAD, HTN and HLD On glipizide  5 mg BID, metformin  500 mg QD and Jardiance  10 mg QD Needs to check blood glucose regularly -advised to contact if blood glucose less than 70 Advised to follow diabetic diet On statin and ARB F/u CMP and lipid panel Diabetic eye exam: Advised to follow up with Ophthalmology for diabetic eye exam      Relevant Orders   CMP14+EGFR   Hemoglobin A1c     Genitourinary   Stage 3a chronic kidney disease (HCC)   Last CMP showed GFR of 59, overall stable Checked urine microalbumin/creatinine ratio - On ARB and Jardiance  Avoid nephrotoxic agents Advised to maintain adequate hydration -needs to cut down soft drinks intake      Relevant Orders   CMP14+EGFR      Other   HLD (hyperlipidemia)   Increased dose of atorvastatin  to 20 mg once daily considering PAD - LDL was 72 in 03/24 Check lipid profile      Relevant Orders   Lipid Profile   Recurrent depressive disorder, current episode mild (HCC)   Overall well controlled On Wellbutrin  for MDD and anxiety, symptoms improved If persistent anxiety, may add Valium - explained that Valium would not help depression      Relevant Medications   buPROPion  (WELLBUTRIN  XL) 150 MG 24 hr tablet   Encounter for general adult medical examination with abnormal findings - Primary   Physical exam as documented. Counseling done  re healthy lifestyle involving commitment to 150 minutes exercise per week, heart healthy diet, and attaining healthy weight.The importance of adequate sleep also discussed. Immunization and cancer screening needs are specifically addressed at this visit.      Prostate cancer screening   Ordered PSA after discussing its limitations for prostate cancer screening, including false positive results leading to additional investigations.      Relevant Orders   PSA     Meds ordered this encounter  Medications   buPROPion  (WELLBUTRIN  XL) 150 MG 24 hr tablet    Sig: Take 1 tablet (150 mg total) by mouth daily.    Dispense:  90 tablet    Refill:  1    Follow-up: Return in about 4 months (around 02/27/2024) for DM and HTN.    Meldon Sport, MD

## 2023-10-28 NOTE — Assessment & Plan Note (Addendum)
 Lab Results  Component Value Date   HGBA1C 7.0 (H) 07/31/2023   Well-controlled Associated with PAD, HTN and HLD On glipizide  5 mg BID, metformin  500 mg QD and Jardiance  10 mg QD Needs to check blood glucose regularly -advised to contact if blood glucose less than 70 Advised to follow diabetic diet On statin and ARB F/u CMP and lipid panel Diabetic eye exam: Advised to follow up with Ophthalmology for diabetic eye exam

## 2023-10-28 NOTE — Assessment & Plan Note (Signed)
 On aspirin  and statin Has claudication symptoms Has weak pulse on DPA Checked US  ABI Vascular surgery referral provided - started Cilostazol 

## 2023-10-28 NOTE — Assessment & Plan Note (Signed)
 Ordered PSA after discussing its limitations for prostate cancer screening, including false positive results leading to additional investigations.

## 2023-10-28 NOTE — Assessment & Plan Note (Signed)
 Overall well controlled On Wellbutrin for MDD and anxiety, symptoms improved If persistent anxiety, may add Valium - explained that Valium would not help depression

## 2023-10-28 NOTE — Assessment & Plan Note (Signed)
 Physical exam as documented. Counseling done  re healthy lifestyle involving commitment to 150 minutes exercise per week, heart healthy diet, and attaining healthy weight.The importance of adequate sleep also discussed. Immunization and cancer screening needs are specifically addressed at this visit.

## 2023-10-28 NOTE — Assessment & Plan Note (Signed)
 Noted on CT chest Has been trying to cut down smoking Asymptomatic currently

## 2023-10-28 NOTE — Assessment & Plan Note (Signed)
 BP Readings from Last 1 Encounters:  10/28/23 113/70   Well-controlled with amlodipine -olmesartan  10-40 mg QD now Counseled for compliance with the medications Advised DASH diet and moderate exercise/walking, at least 150 mins/week

## 2023-10-28 NOTE — Assessment & Plan Note (Signed)
 Last CMP showed GFR of 59, overall stable Checked urine microalbumin/creatinine ratio - On ARB and Jardiance Avoid nephrotoxic agents Advised to maintain adequate hydration -needs to cut down soft drinks intake

## 2023-10-28 NOTE — Assessment & Plan Note (Signed)
 Increased dose of atorvastatin to 20 mg once daily considering PAD - LDL was 72 in 03/24 Check lipid profile

## 2023-10-28 NOTE — Patient Instructions (Signed)
 Please continue to take medications as prescribed.  Please continue to follow low carb diet and perform moderate exercise/walking as tolerated.

## 2023-10-29 LAB — CMP14+EGFR
ALT: 62 IU/L — ABNORMAL HIGH (ref 0–44)
AST: 39 IU/L (ref 0–40)
Albumin: 4.7 g/dL (ref 3.8–4.8)
Alkaline Phosphatase: 141 IU/L — ABNORMAL HIGH (ref 44–121)
BUN/Creatinine Ratio: 11 (ref 10–24)
BUN: 16 mg/dL (ref 8–27)
Bilirubin Total: 0.4 mg/dL (ref 0.0–1.2)
CO2: 16 mmol/L — ABNORMAL LOW (ref 20–29)
Calcium: 9.7 mg/dL (ref 8.6–10.2)
Chloride: 105 mmol/L (ref 96–106)
Creatinine, Ser: 1.42 mg/dL — ABNORMAL HIGH (ref 0.76–1.27)
Globulin, Total: 2.7 g/dL (ref 1.5–4.5)
Glucose: 97 mg/dL (ref 70–99)
Potassium: 4.7 mmol/L (ref 3.5–5.2)
Sodium: 139 mmol/L (ref 134–144)
Total Protein: 7.4 g/dL (ref 6.0–8.5)
eGFR: 51 mL/min/{1.73_m2} — ABNORMAL LOW (ref >59)

## 2023-10-29 LAB — PSA: Prostate Specific Ag, Serum: 3.3 ng/mL (ref 0.0–4.0)

## 2023-10-29 LAB — LIPID PANEL
Chol/HDL Ratio: 2.9 ratio (ref 0.0–5.0)
Cholesterol, Total: 97 mg/dL — ABNORMAL LOW (ref 100–199)
HDL: 34 mg/dL — ABNORMAL LOW (ref >39)
LDL Chol Calc (NIH): 51 mg/dL (ref 0–99)
Triglycerides: 48 mg/dL (ref 0–149)
VLDL Cholesterol Cal: 12 mg/dL (ref 5–40)

## 2023-10-29 LAB — HEMOGLOBIN A1C
Est. average glucose Bld gHb Est-mCnc: 128 mg/dL
Hgb A1c MFr Bld: 6.1 % — ABNORMAL HIGH (ref 4.8–5.6)

## 2023-11-04 ENCOUNTER — Other Ambulatory Visit: Payer: Self-pay | Admitting: *Deleted

## 2023-11-04 ENCOUNTER — Ambulatory Visit: Admitting: Podiatry

## 2023-11-04 ENCOUNTER — Other Ambulatory Visit: Payer: Self-pay

## 2023-11-04 NOTE — Patient Instructions (Signed)
 Visit Information  Thank you for taking time to visit with me today. Please don't hesitate to contact me if I can be of assistance to you before our next scheduled appointment.  Your next care management appointment is by telephone on 12-04-2023 at 9:00 am  Telephone follow-up in 1 month  Please call the care guide team at [phone] if you need to cancel, schedule, or reschedule an appointment.   Please call the Suicide and Crisis Lifeline: 988 call the [place]  National Suicide Prevention Lifeline: 510 439 0071 or TTY: 612-299-2506 TTY 949-228-2348) to talk to a trained counselor call 1-800-273-TALK (toll free, 24 hour hotline) call the [facility]: 732-415-7288 call 911 if you are experiencing a Mental Health or Behavioral Health Crisis or need someone to talk to.  Grandville Lax, BSN RN [facility], [facility] RN Care Manager Direct Dial: [phone]  Fax: [phone]

## 2023-11-04 NOTE — Patient Outreach (Signed)
 Complex Care Management   Visit Note  11/04/2023  Name:  Alex Marks MRN: [id number] DOB: 21-Dec-1945  Situation: Referral received for Complex Care Management related to Diabetes with Complications I obtained verbal consent from Patient.  Visit completed with patient  on the phone  Background:   Past Medical History:  Diagnosis Date   Anxiety    COPD (chronic obstructive pulmonary disease) (HCC)    Dark stools 07/08/2019   Depression    Depression    Frequency of urination    GAD (generalized anxiety disorder)    GERD (gastroesophageal reflux disease)    Hepatitis C    s/p treatment with Dr. Kimble Pennant in 2017. HCV RNA not detected in April 2021   Hypercholesterolemia    Hypertension    PAD (peripheral artery disease) (HCC)    Scalp lesion 04/12/2019   Substance abuse (HCC)    Transfusion of blood product refused for religious reason    Type II diabetes mellitus (HCC)     Assessment: Patient Reported Symptoms:  Cognitive Cognitive Status: Alert and oriented to person, place, and time Cognitive/Intellectual Conditions Management [RPT]: None reported or documented in medical history or problem list   Health Maintenance Behaviors: Annual physical exam Healing Pattern: Average Health Facilitated by: Rest  Neurological Neurological Review of Symptoms: No symptoms reported Neurological Management Strategies: Routine screening Neurological Self-Management Outcome: 4 (good)  HEENT HEENT Symptoms Reported: Sudden change or loss of vision HEENT Conditions: Vision problem(s) Vision Problems: blindness/vision loss HEENT Self-Management Outcome: 3 (uncertain) HEENT Comment: Eye exam scheduled 12-09-23 Vision problem(s)  Cardiovascular Cardiovascular Symptoms Reported: No symptoms reported Does patient have uncontrolled Hypertension?: No Cardiovascular Conditions: High blood cholesterol, Hypertension Cardiovascular Management Strategies: Medication therapy, Routine screening, Diet  modification Cardiovascular Self-Management Outcome: 4 (good)  Respiratory Respiratory Symptoms Reported: No symptoms reported Respiratory Self-Management Outcome: 4 (good)  Endocrine Patient reports the following symptoms related to hypoglycemia or hyperglycemia : No symptoms reported Is patient diabetic?: Yes Is patient checking blood sugars at home?: Yes Endocrine Conditions: Diabetes Endocrine Management Strategies: Routine screening, Medication therapy, Diet modification Endocrine Self-Management Outcome: 4 (good)  Gastrointestinal Gastrointestinal Symptoms Reported: No symptoms reported Gastrointestinal Self-Management Outcome: 4 (good) Nutrition Risk Screen (CP): No indicators present  Genitourinary Genitourinary Symptoms Reported: No symptoms reported Genitourinary Self-Management Outcome: 4 (good)  Integumentary Integumentary Symptoms Reported: No symptoms reported Skin Self-Management Outcome: 4 (good)  Musculoskeletal Musculoskelatal Symptoms Reviewed: No symptoms reported Musculoskeletal Self-Management Outcome: 4 (good) Falls in the past year?: No Number of falls in past year: 1 or less Was there an injury with Fall?: No Fall Risk Category Calculator: 0 Patient Fall Risk Level: Low Fall Risk Patient at Risk for Falls Due to: No Fall Risks Fall risk Follow up: Falls evaluation completed  Psychosocial Psychosocial Symptoms Reported: No symptoms reported Behavioral Health Self-Management Outcome: 4 (good) Major Change/Loss/Stressor/Fears (CP): Denies Techniques to Cope with Loss/Stress/Change: Not applicable Quality of Family Relationships: non-existent Do you feel physically threatened by others?: No      11/04/2023    9:33 AM  Depression screen PHQ 2/9  Decreased Interest 0  Down, Depressed, Hopeless 0  PHQ - 2 Score 0    There were no vitals filed for this visit.  Medications Reviewed Today     Reviewed by Remona Carmel, RN (Registered Nurse) on 11/04/23  at [phone]  Med List Status: <None>   Medication Order Taking? Sig Documenting Provider Last Dose Status Informant  amLODipine -olmesartan  (AZOR ) 10-40 MG tablet [id number] Yes Take 1  tablet by mouth daily. Meldon Sport, MD Taking Active   aspirin  81 MG chewable tablet [id number] Yes Chew 1 tablet by mouth daily. [provider] Taking Active   atorvastatin  (LIPITOR) 20 MG tablet [id number] Yes Take 1 tablet (20 mg total) by mouth daily. Meldon Sport, MD Taking Active   Blood Glucose Monitoring Suppl DEVI [id number] Yes 1 each by Does not apply route in the morning, at noon, and at bedtime. May substitute to any manufacturer covered by patient's insurance. Meldon Sport, MD Taking Active   buPROPion  (WELLBUTRIN  XL) 150 MG 24 hr tablet [id number]  Take 1 tablet (150 mg total) by mouth daily. Meldon Sport, MD  Active   Carboxymethylcell-Glycerin PF (REFRESH OPTIVE PF) 0.5-0.9 % SOLN [id number] Yes Place 2 drops into both eyes daily as needed (Irritation). [provider] Taking Active Self  Cholecalciferol  (VITAMIN D3) 25 MCG (1000 UT) CAPS [id number] Yes Take 1 capsule (1,000 Units total) by mouth in the morning and at bedtime. Zorita Hiss, NP Taking Active            Med Note Oletta Berry, Danella Philson M   Mon Oct 06, 2023  9:24 AM) Patient taking differently. Takes one capsule daily  cilostazol  (PLETAL ) 100 MG tablet [id number] Yes Take 1 tablet (100 mg total) by mouth 2 (two) times daily before a meal. Kayla Part, MD Taking Active   erythromycin  ophthalmic ointment [id number] Yes Place a 1/2 inch ribbon of ointment into the left lower eyelid BID prn. Corbin Dess, [place] Taking Active   fexofenadine (ALLEGRA) 180 MG tablet [id number] Yes Take 180 mg by mouth daily. [provider] Taking Active Self  glipiZIDE  (GLUCOTROL ) 5 MG tablet [id number] Yes Take 1 tablet (5 mg total) by mouth 2 (two) times daily before a meal. Meldon Sport, MD Taking Active   Glucose Blood  (BLOOD GLUCOSE TEST STRIPS) STRP [id number] Yes 1 each by In Vitro route in the morning, at noon, and at bedtime. May substitute to any manufacturer covered by patient's insurance. Meldon Sport, MD Taking Active   JARDIANCE  10 MG TABS tablet [id number] Yes TAKE 1 TABLET(10 MG) BY MOUTH DAILY BEFORE BREAKFAST Meldon Sport, MD Taking Active   Lancets Misc. MISC [id number] Yes 1 each by Does not apply route in the morning, at noon, and at bedtime. May substitute to any manufacturer covered by patient's insurance. Meldon Sport, MD Taking Active   loratadine (CLARITIN) 10 MG tablet [id number] Yes Take 10 mg by mouth daily. [provider] Taking Active Self  metFORMIN  (GLUCOPHAGE ) 500 MG tablet [id number] Yes Take 1 tablet (500 mg total) by mouth daily with breakfast. Meldon Sport, MD Taking Active   Multiple Vitamin (MULTIVITAMIN WITH MINERALS) TABS tablet [id number] Yes Take 1 tablet by mouth daily. Senior [employer], Historical, MD Taking Active Self  olopatadine  (PATADAY ) 0.1 % ophthalmic solution [id number] Yes Place 1 drop into both eyes 2 (two) times daily. Corbin Dess, [place] Taking Active   Omega-3 Fatty Acids (FISH OIL  PEARLS) 300 MG CAPS [id number] Yes Take 600 mg by mouth 2 (two) times daily.  Patient taking differently: Take 1,000 mg by mouth at bedtime.   Lenor Raddle, MD Taking Active Self  oxymetazoline (NASAL SPRAY MOISTURIZING 12 HR) 0.05 % nasal spray [id number] Yes Place 1 spray into both nostrils daily as needed for congestion. [provider] Taking Active Self  vitamin C (ASCORBIC ACID) 500 MG tablet [id number] Yes Take 500 mg  by mouth at bedtime. [provider] Taking Active Self            Recommendation:   PCP Follow-up  Follow Up Plan:   Telephone follow up appointment date/time:  12-04-2023 at 9:00 am  Grandville Lax, BSN RN [facility], [facility] RN Care Manager Direct Dial:  [phone]  Fax: [phone]

## 2023-11-11 ENCOUNTER — Ambulatory Visit: Admitting: Podiatry

## 2023-11-11 DIAGNOSIS — L84 Corns and callosities: Secondary | ICD-10-CM | POA: Diagnosis not present

## 2023-11-11 DIAGNOSIS — E1151 Type 2 diabetes mellitus with diabetic peripheral angiopathy without gangrene: Secondary | ICD-10-CM | POA: Diagnosis not present

## 2023-11-11 DIAGNOSIS — I739 Peripheral vascular disease, unspecified: Secondary | ICD-10-CM

## 2023-11-11 DIAGNOSIS — L603 Nail dystrophy: Secondary | ICD-10-CM

## 2023-11-11 NOTE — Progress Notes (Signed)
 Patient presents for evaluation and treatment of tenderness around nails feet and hyperkeratotic lesions.  Patient is diabetic.  Has not noticed any signs of ulceration or infection.  Physical exam:  General appearance: Alert, pleasant, and in no acute distress  Vascular: Pedal pulses: DP palpable bilaterally, PT nonpalpable bilaterally.  Moderate edema lower legs bilaterally  Neurological:  No paresthesias or burning noted  Dermatologic:  Hyperkeratotic lesions plantar lateral heels bilaterally, distal third toe right, plantar medial aspect IPJ hallux right, plantar medial aspect first MTP right, and plantar aspect second MTP left. Nails thickened and dystrophic 1-5 BL. Skin thinned.  Decreased hair lower limbs BL.  Skin hyperpigmentation lower legs BL.  Musculoskeletal: Toe deformities 2 through 5 bilaterally and HAV deformities bilaterally   Diagnosis: Hyperkeratotic lesions feet. Dystrophic nails 1-5 BL. PAD/PVD  Q8 Diabetes mellitus Type 2 with PVD.  Plan: Debrided hyperkeratotic lesions feet x 6. Debrided dystrophic thickened nails 1 through 5 bilaterally.  Return 3 months for [facility]

## 2023-11-20 ENCOUNTER — Other Ambulatory Visit: Payer: Self-pay | Admitting: Internal Medicine

## 2023-11-20 DIAGNOSIS — E782 Mixed hyperlipidemia: Secondary | ICD-10-CM

## 2023-11-27 ENCOUNTER — Encounter (HOSPITAL_COMMUNITY)

## 2023-11-27 ENCOUNTER — Ambulatory Visit: Admitting: Vascular Surgery

## 2023-12-04 ENCOUNTER — Telehealth: Payer: Self-pay | Admitting: *Deleted

## 2023-12-09 DIAGNOSIS — D4819 Other specified neoplasm of uncertain behavior of connective and other soft tissue: Secondary | ICD-10-CM | POA: Diagnosis not present

## 2023-12-09 DIAGNOSIS — E119 Type 2 diabetes mellitus without complications: Secondary | ICD-10-CM | POA: Diagnosis not present

## 2023-12-09 DIAGNOSIS — H25813 Combined forms of age-related cataract, bilateral: Secondary | ICD-10-CM | POA: Diagnosis not present

## 2023-12-09 DIAGNOSIS — H524 Presbyopia: Secondary | ICD-10-CM | POA: Diagnosis not present

## 2023-12-09 DIAGNOSIS — H04123 Dry eye syndrome of bilateral lacrimal glands: Secondary | ICD-10-CM | POA: Diagnosis not present

## 2023-12-09 LAB — HM DIABETES EYE EXAM

## 2023-12-16 ENCOUNTER — Other Ambulatory Visit: Payer: Self-pay | Admitting: Internal Medicine

## 2023-12-16 DIAGNOSIS — E1169 Type 2 diabetes mellitus with other specified complication: Secondary | ICD-10-CM

## 2023-12-17 ENCOUNTER — Telehealth: Payer: Self-pay

## 2023-12-17 NOTE — Progress Notes (Signed)
 Complex Care Management Care Guide Note  12/17/2023 Name: Alex Marks MRN: [id number] DOB: Oct 13, 1945  Alex Marks is a 78 y.o. year old male who is a primary care patient of Meldon Sport, MD and is actively engaged with the care management team. I reached out to Bobbette Burns by phone today to assist with re-scheduling  with the RN Case Manager.  Follow up plan: Unsuccessful telephone outreach attempt made. A HIPAA compliant phone message was left for the patient providing contact information and requesting a return call.  Lenton Rail , RMA     [facility]  [facility], [facility] Guide  Direct Dial: [phone]  Website: [email or web address]

## 2023-12-18 ENCOUNTER — Other Ambulatory Visit: Payer: Self-pay | Admitting: Vascular Surgery

## 2023-12-18 ENCOUNTER — Other Ambulatory Visit: Payer: Self-pay | Admitting: *Deleted

## 2023-12-18 DIAGNOSIS — I70213 Atherosclerosis of native arteries of extremities with intermittent claudication, bilateral legs: Secondary | ICD-10-CM

## 2023-12-18 DIAGNOSIS — I739 Peripheral vascular disease, unspecified: Secondary | ICD-10-CM

## 2023-12-18 NOTE — Patient Instructions (Signed)
 Visit Information  Thank you for taking time to visit with me today. Please don't hesitate to contact me if I can be of assistance to you before our next scheduled appointment.  Your next care management appointment is by telephone on 01-15-2024 at 9:30 am  Telephone follow-up in 1 month  Please call the care guide team at [phone] if you need to cancel, schedule, or reschedule an appointment.   Please call the Suicide and Crisis Lifeline: 988 call the [place]  National Suicide Prevention Lifeline: (208)767-2157 or TTY: (534) 089-9450 TTY (479) 402-2581) to talk to a trained counselor call 1-800-273-TALK (toll free, 24 hour hotline) call the [facility]: 531-110-2767 call 911 if you are experiencing a Mental Health or Behavioral Health Crisis or need someone to talk to.  Grandville Lax, BSN RN [facility], [facility] RN Care Manager Direct Dial: [phone]  Fax: [phone]

## 2023-12-18 NOTE — Patient Outreach (Signed)
 Complex Care Management   Visit Note  12/18/2023  Name:  Alex Marks MRN: [id number] DOB: 02/17/1946  Situation: Referral received for Complex Care Management related to Diabetes with Complications I obtained verbal consent from Patient.  Visit completed with patient  on the phone  Background:   Past Medical History:  Diagnosis Date   Anxiety    COPD (chronic obstructive pulmonary disease) (HCC)    Dark stools 07/08/2019   Depression    Depression    Frequency of urination    GAD (generalized anxiety disorder)    GERD (gastroesophageal reflux disease)    Hepatitis C    s/p treatment with Dr. Kimble Pennant in 2017. HCV RNA not detected in April 2021   Hypercholesterolemia    Hypertension    PAD (peripheral artery disease) (HCC)    Scalp lesion 04/12/2019   Substance abuse (HCC)    Transfusion of blood product refused for religious reason    Type II diabetes mellitus (HCC)     Assessment: Patient Reported Symptoms:  Cognitive Cognitive Status: Alert and oriented to person, place, and time, Normal speech and language skills, Insightful and able to interpret abstract concepts Cognitive/Intellectual Conditions Management [RPT]: None reported or documented in medical history or problem list   Health Maintenance Behaviors: Annual physical exam Healing Pattern: Average Health Facilitated by: Rest  Neurological Neurological Review of Symptoms: No symptoms reported    HEENT HEENT Symptoms Reported: Tearing      Cardiovascular Cardiovascular Symptoms Reported: No symptoms reported Cardiovascular Conditions: High blood cholesterol, Hypertension  Respiratory Respiratory Symptoms Reported: No symptoms reported    Endocrine Patient reports the following symptoms related to hypoglycemia or hyperglycemia : No symptoms reported Is patient diabetic?: Yes Is patient checking blood sugars at home?: No Endocrine Conditions: Diabetes Endocrine Management Strategies: Routine screening,  Medication therapy  Gastrointestinal Gastrointestinal Symptoms Reported: No symptoms reported   Nutrition Risk Screen (CP): No indicators present  Genitourinary Genitourinary Symptoms Reported: No symptoms reported    Integumentary Integumentary Symptoms Reported: No symptoms reported    Musculoskeletal Musculoskelatal Symptoms Reviewed: No symptoms reported   Falls in the past year?: No Number of falls in past year: 1 or less Was there an injury with Fall?: No Fall Risk Category Calculator: 0 Patient Fall Risk Level: Low Fall Risk Patient at Risk for Falls Due to: No Fall Risks Fall risk Follow up: Falls evaluation completed  Psychosocial Psychosocial Symptoms Reported: No symptoms reported Behavioral Health Conditions: Depression Major Change/Loss/Stressor/Fears (CP): Denies Techniques to Cope with Loss/Stress/Change: Not applicable Quality of Family Relationships: non-existent Do you feel physically threatened by others?: No      12/18/2023    3:15 PM  Depression screen PHQ 2/9  Decreased Interest 0  Down, Depressed, Hopeless 0  PHQ - 2 Score 0    There were no vitals filed for this visit.  Medications Reviewed Today     Reviewed by Remona Carmel, RN (Registered Nurse) on 12/18/23 at 1459  Med List Status: <None>   Medication Order Taking? Sig Documenting Provider Last Dose Status Informant  amLODipine -olmesartan  (AZOR ) 10-40 MG tablet [id number] Yes Take 1 tablet by mouth daily. Meldon Sport, MD  Active   aspirin  81 MG chewable tablet [id number] Yes Chew 1 tablet by mouth daily. [provider]  Active   atorvastatin  (LIPITOR) 20 MG tablet [id number] Yes Take 1 tablet (20 mg total) by mouth daily. Meldon Sport, MD  Active   Blood Glucose Monitoring Suppl DEVI [id number] Yes  1 each by Does not apply route in the morning, at noon, and at bedtime. May substitute to any manufacturer covered by patient's insurance. Meldon Sport, MD  Active   buPROPion   (WELLBUTRIN  XL) 150 MG 24 hr tablet [id number] Yes Take 1 tablet (150 mg total) by mouth daily. Meldon Sport, MD  Active   Carboxymethylcell-Glycerin PF (REFRESH OPTIVE PF) 0.5-0.9 % SOLN [id number] Yes Place 2 drops into both eyes daily as needed (Irritation). [provider]  Active Self  Cholecalciferol  (VITAMIN D3) 25 MCG (1000 UT) CAPS [id number] Yes Take 1 capsule (1,000 Units total) by mouth in the morning and at bedtime. Zorita Hiss, NP  Active            Med Note Oletta Berry, Jarious Lyon M   Mon Oct 06, 2023  9:24 AM) Patient taking differently. Takes one capsule daily  cilostazol  (PLETAL ) 100 MG tablet [id number] Yes Take 1 tablet (100 mg total) by mouth 2 (two) times daily before a meal. Kayla Part, MD  Active   erythromycin  ophthalmic ointment [id number]  Place a 1/2 inch ribbon of ointment into the left lower eyelid BID prn.  Patient not taking: Reported on 12/18/2023   Corbin Dess, PA-C  Active   fexofenadine (ALLEGRA) 180 MG tablet [id number] Yes Take 180 mg by mouth daily. [provider]  Active Self  glipiZIDE  (GLUCOTROL ) 5 MG tablet [id number] Yes Take 1 tablet (5 mg total) by mouth 2 (two) times daily before a meal. Meldon Sport, MD  Active   Glucose Blood (BLOOD GLUCOSE TEST STRIPS) STRP [id number] Yes 1 each by In Vitro route in the morning, at noon, and at bedtime. May substitute to any manufacturer covered by patient's insurance. Meldon Sport, MD  Active   JARDIANCE  10 MG TABS tablet [id number] Yes TAKE 1 TABLET(10 MG) BY MOUTH DAILY BEFORE BREAKFAST Patel, Rutwik K, MD  Active   Lancets Misc. MISC [id number] Yes 1 each by Does not apply route in the morning, at noon, and at bedtime. May substitute to any manufacturer covered by patient's insurance. Meldon Sport, MD  Active   loratadine (CLARITIN) 10 MG tablet [id number]  Take 10 mg by mouth daily.  Patient not taking: Reported on 12/18/2023   [provider]  Active Self  metFORMIN   (GLUCOPHAGE ) 500 MG tablet [id number] Yes Take 1 tablet (500 mg total) by mouth daily with breakfast. Meldon Sport, MD  Active   Multiple Vitamin (MULTIVITAMIN WITH MINERALS) TABS tablet [id number] Yes Take 1 tablet by mouth daily. Senior [employer], Historical, MD  Active Self  olopatadine  (PATADAY ) 0.1 % ophthalmic solution [id number] Yes Place 1 drop into both eyes 2 (two) times daily. Corbin Dess, PA-C  Active   Omega-3 Fatty Acids (FISH OIL  PEARLS) 300 MG CAPS [id number] Yes Take 600 mg by mouth 2 (two) times daily. Gosrani, Nimish C, MD  Active Self  oxymetazoline (NASAL SPRAY MOISTURIZING 12 HR) 0.05 % nasal spray [id number] Yes Place 1 spray into both nostrils daily as needed for congestion. [provider]  Active Self  vitamin C (ASCORBIC ACID) 500 MG tablet [id number] Yes Take 500 mg by mouth at bedtime. [provider]  Active Self            Recommendation:   Continue Current Plan of Care  Follow Up Plan:   Telephone follow-up in 1 month  Grandville Lax, BSN RN [facility]  [facility], [facility] RN Care Manager Direct  Dial: [phone]  Fax: [phone]

## 2023-12-22 ENCOUNTER — Telehealth: Payer: Self-pay

## 2023-12-22 NOTE — Telephone Encounter (Signed)
 Copied from CRM [id number]. Topic: Clinical - Medication Question >> Dec 22, 2023 12:44 PM Delon T wrote: Reason for CRM: Patient wants to know if supplement Omega XL is safe to take and also has vitamin D3 5000 I.U. - [phone]

## 2023-12-22 NOTE — Telephone Encounter (Signed)
 Pt aware and verbalized understanding.

## 2023-12-25 ENCOUNTER — Ambulatory Visit: Admitting: Vascular Surgery

## 2023-12-25 ENCOUNTER — Encounter (HOSPITAL_COMMUNITY)

## 2024-01-01 ENCOUNTER — Ambulatory Visit: Admitting: Vascular Surgery

## 2024-01-01 ENCOUNTER — Ambulatory Visit (HOSPITAL_COMMUNITY)

## 2024-01-05 ENCOUNTER — Other Ambulatory Visit (HOSPITAL_COMMUNITY): Payer: Self-pay | Admitting: Acute Care

## 2024-01-05 DIAGNOSIS — Z122 Encounter for screening for malignant neoplasm of respiratory organs: Secondary | ICD-10-CM

## 2024-01-05 DIAGNOSIS — F17210 Nicotine dependence, cigarettes, uncomplicated: Secondary | ICD-10-CM

## 2024-01-05 DIAGNOSIS — Z87891 Personal history of nicotine dependence: Secondary | ICD-10-CM

## 2024-01-08 ENCOUNTER — Ambulatory Visit (HOSPITAL_COMMUNITY): Admission: RE | Admit: 2024-01-08 | Source: Ambulatory Visit

## 2024-01-15 ENCOUNTER — Telehealth: Payer: Self-pay | Admitting: *Deleted

## 2024-01-15 ENCOUNTER — Encounter: Payer: Self-pay | Admitting: *Deleted

## 2024-01-15 NOTE — Patient Instructions (Signed)
 Rosendo LITTIE Hurst - I am sorry we were unable to complete your call today for our scheduled appointment. I have rescheduled your appointment at your request for 01-22-2024 at 9:30 am. Please contact me at [phone] at your earliest convenience, if you have any questions. I look forward to speaking with you soon.   Thank you,   Rosina Forte, BSN RN [facility], [facility] RN Care Manager Direct Dial: [phone]  Fax: [phone]

## 2024-01-18 ENCOUNTER — Other Ambulatory Visit: Payer: Self-pay | Admitting: Internal Medicine

## 2024-01-18 DIAGNOSIS — E1169 Type 2 diabetes mellitus with other specified complication: Secondary | ICD-10-CM

## 2024-01-19 ENCOUNTER — Telehealth: Payer: Self-pay | Admitting: Internal Medicine

## 2024-01-19 ENCOUNTER — Telehealth: Payer: Self-pay

## 2024-01-19 NOTE — Telephone Encounter (Signed)
 Copied from CRM [id number]. Topic: General - Other >> Jan 19, 2024  8:43 AM Daved SQUIBB wrote: Reason for CRM: Roselie [phone] from [employer] [place] [employer] called to make sure we received the fax she sent in this morning for this patient personal care service forms to fax# [phone]

## 2024-01-19 NOTE — Telephone Encounter (Signed)
 [employer] forms  Noted Copied Sleeved Original placed in provider box Copy form front desk folder

## 2024-01-21 ENCOUNTER — Telehealth: Payer: Self-pay

## 2024-01-21 NOTE — Telephone Encounter (Signed)
 Copied from CRM [id number]. Topic: General - Other >> Jan 21, 2024  3:56 PM Emylou G wrote: Reason for CRM: Patient called.. checking status of homecare options.. but he has another one to suggest: [facility] phone: [phone] fax: [phone] >> Jan 21, 2024  4:07 PM Fonda T wrote: Update: Patient calling back to give correct fax number, as previous number was incorrect, correct fax [phone], Fax is for [facility].   Patient wanted to give updated, corrected fax number.

## 2024-01-22 ENCOUNTER — Telehealth: Payer: Self-pay | Admitting: *Deleted

## 2024-01-27 ENCOUNTER — Ambulatory Visit (HOSPITAL_COMMUNITY)
Admission: RE | Admit: 2024-01-27 | Discharge: 2024-01-27 | Disposition: A | Discharge status: Home / Self Care | Source: Ambulatory Visit | Attending: Internal Medicine | Admitting: Internal Medicine

## 2024-01-27 DIAGNOSIS — F17210 Nicotine dependence, cigarettes, uncomplicated: Secondary | ICD-10-CM | POA: Insufficient documentation

## 2024-01-27 DIAGNOSIS — Z122 Encounter for screening for malignant neoplasm of respiratory organs: Secondary | ICD-10-CM | POA: Diagnosis not present

## 2024-01-27 DIAGNOSIS — Z87891 Personal history of nicotine dependence: Secondary | ICD-10-CM | POA: Diagnosis not present

## 2024-02-02 ENCOUNTER — Other Ambulatory Visit: Payer: Self-pay | Admitting: *Deleted

## 2024-02-02 NOTE — Patient Outreach (Signed)
 Complex Care Management   Visit Note  02/02/2024  Name:  Alex Marks MRN: [id number] DOB: 03/11/1946  Situation: Referral received for Complex Care Management related to Diabetes with Complications I obtained verbal consent from Patient.  Visit completed with patient  on the phone  Background:   Past Medical History:  Diagnosis Date   Anxiety    COPD (chronic obstructive pulmonary disease) (HCC)    Dark stools 07/08/2019   Depression    Depression    Frequency of urination    GAD (generalized anxiety disorder)    GERD (gastroesophageal reflux disease)    Hepatitis C    s/p treatment with Dr. Burnette in 2017. HCV RNA not detected in April 2021   Hypercholesterolemia    Hypertension    PAD (peripheral artery disease) (HCC)    Scalp lesion 04/12/2019   Substance abuse (HCC)    Transfusion of blood product refused for religious reason    Type II diabetes mellitus (HCC)     Assessment: Patient Reported Symptoms:  Cognitive Cognitive Status: No symptoms reported Cognitive/Intellectual Conditions Management [RPT]: None reported or documented in medical history or problem list   Health Maintenance Behaviors: Annual physical exam Healing Pattern: Average Health Facilitated by: Rest  Neurological Neurological Review of Symptoms: No symptoms reported Neurological Management Strategies: Routine screening Neurological Self-Management Outcome: 4 (good)  HEENT HEENT Symptoms Reported: Tearing HEENT Management Strategies: Diet modification HEENT Self-Management Outcome: 3 (uncertain) Vision problem(s)  Cardiovascular Cardiovascular Symptoms Reported: No symptoms reported Does patient have uncontrolled Hypertension?: No Cardiovascular Management Strategies: Medication therapy, Routine screening Cardiovascular Self-Management Outcome: 4 (good)  Respiratory Respiratory Symptoms Reported: No symptoms reported Respiratory Management Strategies: Routine screening Respiratory  Self-Management Outcome: 4 (good)  Endocrine Endocrine Symptoms Reported: No symptoms reported Is patient diabetic?: Yes Is patient checking blood sugars at home?: No Endocrine Self-Management Outcome: 3 (uncertain)  Gastrointestinal Gastrointestinal Symptoms Reported: No symptoms reported Gastrointestinal Self-Management Outcome: 4 (good) Nutrition Risk Screen (CP): No indicators present  Genitourinary Genitourinary Symptoms Reported: No symptoms reported Genitourinary Self-Management Outcome: 4 (good)  Integumentary Integumentary Symptoms Reported: No symptoms reported Skin Self-Management Outcome: 4 (good)  Musculoskeletal Musculoskelatal Symptoms Reviewed: Weakness Musculoskeletal Self-Management Outcome: 4 (good) Falls in the past year?: No Number of falls in past year: 1 or less Was there an injury with Fall?: No Fall Risk Category Calculator: 0 Patient Fall Risk Level: Low Fall Risk Patient at Risk for Falls Due to: No Fall Risks Fall risk Follow up: Falls evaluation completed  Psychosocial Psychosocial Symptoms Reported: Depression - if selected complete PHQ 2-9 Behavioral Management Strategies: Coping strategies Behavioral Health Self-Management Outcome: 4 (good) Major Change/Loss/Stressor/Fears (CP): Denies Techniques to Cope with Loss/Stress/Change: None Quality of Family Relationships: non-existent Do you feel physically threatened by others?: No      02/02/2024    2:55 PM  Depression screen PHQ 2/9  Decreased Interest 3  PHQ - 2 Score 3  Altered sleeping 3  Tired, decreased energy 3  Change in appetite 0  Feeling bad or failure about yourself  0  Trouble concentrating 0  Moving slowly or fidgety/restless 0  Suicidal thoughts 0  PHQ-9 Score 9  Difficult doing work/chores Not difficult at all   PHQ2-9 Depression Screening   Little interest or pleasure in doing things Nearly every day  Feeling down, depressed, or hopeless    PHQ-2 - Total Score 3  Trouble  falling or staying asleep, or sleeping too much Nearly every day  Feeling tired or having little  energy Nearly every day  Poor appetite or overeating  Not at all  Feeling bad about yourself - or that you are a failure or have let yourself or your family down Not at all  Trouble concentrating on things, such as reading the newspaper or watching television Not at all  Moving or speaking so slowly that other people could have noticed.  Or the opposite - being so fidgety or restless that you have been moving around a lot more than usual Not at all  Thoughts that you would be better off dead, or hurting yourself in some way Not at all  PHQ2-9 Total Score 9  If you checked off any problems, how difficult have these problems made it for you to do your work, take care of things at home, or get along with other people Not difficult at all  Depression Interventions/Treatment       There were no vitals filed for this visit.  Medications Reviewed Today     Reviewed by Bertrum Rosina HERO, RN (Registered Nurse) on 02/02/24 at 1504  Med List Status: <None>   Medication Order Taking? Sig Documenting Provider Last Dose Status Informant  amLODipine -olmesartan  (AZOR ) 10-40 MG tablet [id number] Yes Take 1 tablet by mouth daily. Tobie Suzzane POUR, MD  Active   aspirin  81 MG chewable tablet [id number] Yes Chew 1 tablet by mouth daily. [provider]  Active   atorvastatin  (LIPITOR) 20 MG tablet [id number] Yes Take 1 tablet (20 mg total) by mouth daily. Tobie Suzzane POUR, MD  Active   Blood Glucose Monitoring Suppl DEVI [id number] Yes 1 each by Does not apply route in the morning, at noon, and at bedtime. May substitute to any manufacturer covered by patient's insurance. Tobie Suzzane POUR, MD  Active   buPROPion  (WELLBUTRIN  XL) 150 MG 24 hr tablet [id number] Yes Take 1 tablet (150 mg total) by mouth daily. Tobie Suzzane POUR, MD  Active   Carboxymethylcell-Glycerin PF (REFRESH OPTIVE PF) 0.5-0.9 % SOLN [id number] Yes  Place 2 drops into both eyes daily as needed (Irritation). [provider]  Active Self  Cholecalciferol  (VITAMIN D3) 25 MCG (1000 UT) CAPS [id number] Yes Take 1 capsule (1,000 Units total) by mouth in the morning and at bedtime. Elnor Lauraine BRAVO, NP  Active            Med Note GARNET, Angelynn Lemus M   Mon Oct 06, 2023  9:24 AM) Patient taking differently. Takes one capsule daily  cilostazol  (PLETAL ) 100 MG tablet [id number] Yes Take 1 tablet (100 mg total) by mouth 2 (two) times daily before a meal. Lanis Fonda BRAVO, MD  Active   empagliflozin  (JARDIANCE ) 10 MG TABS tablet [id number] Yes TAKE 1 TABLET(10 MG) BY MOUTH DAILY BEFORE BREAKFAST Patel, Rutwik K, MD  Active   erythromycin  ophthalmic ointment [id number] Yes Place a 1/2 inch ribbon of ointment into the left lower eyelid BID prn. Stuart Vernell Norris, PA-C  Active   fexofenadine (ALLEGRA) 180 MG tablet [id number] Yes Take 180 mg by mouth daily. [provider]  Active Self  glipiZIDE  (GLUCOTROL ) 5 MG tablet [id number] Yes Take 1 tablet (5 mg total) by mouth 2 (two) times daily before a meal. Tobie Suzzane POUR, MD  Active   Glucose Blood (BLOOD GLUCOSE TEST STRIPS) STRP [id number] Yes 1 each by In Vitro route in the morning, at noon, and at bedtime. May substitute to any manufacturer covered by patient's insurance. Tobie Suzzane POUR, MD  Active   Lancets Misc. MISC [id number]  Yes 1 each by Does not apply route in the morning, at noon, and at bedtime. May substitute to any manufacturer covered by patient's insurance. Tobie Suzzane POUR, MD  Active   loratadine (CLARITIN) 10 MG tablet [id number] Yes Take 10 mg by mouth daily. [provider]  Active Self  metFORMIN  (GLUCOPHAGE ) 500 MG tablet [id number] Yes Take 1 tablet (500 mg total) by mouth daily with breakfast. Tobie Suzzane POUR, MD  Active   Multiple Vitamin (MULTIVITAMIN WITH MINERALS) TABS tablet [id number] Yes Take 1 tablet by mouth daily. [employer] copy, Historical, MD  Active  Self  olopatadine  (PATADAY ) 0.1 % ophthalmic solution [id number] Yes Place 1 drop into both eyes 2 (two) times daily. Stuart Vernell Norris, [place]  Active   Omega-3 Fatty Acids (FISH OIL  PEARLS) 300 MG CAPS [id number] Yes Take 600 mg by mouth 2 (two) times daily. Gosrani, Nimish C, MD  Active Self  oxymetazoline (NASAL SPRAY MOISTURIZING 12 HR) 0.05 % nasal spray [id number] Yes Place 1 spray into both nostrils daily as needed for congestion. [provider]  Active Self  vitamin C (ASCORBIC ACID) 500 MG tablet [id number] Yes Take 500 mg by mouth at bedtime. [provider]  Active Self            Recommendation:   Continue Current Plan of Care  Follow Up Plan:   Telephone follow-up in 1 month  Rosina Forte, BSN RN [facility], [facility] RN Care Manager Direct Dial: [phone]  Fax: [phone]

## 2024-02-02 NOTE — Patient Instructions (Signed)
 Visit Information  Thank you for taking time to visit with me today. Please don't hesitate to contact me if I can be of assistance to you before our next scheduled appointment.  Your next care management appointment is by telephone on 03-03-2024 at 2:45 pm  Telephone follow-up in 1 month  Please call the care guide team at [phone] if you need to cancel, schedule, or reschedule an appointment.   Please call the Suicide and Crisis Lifeline: 988 call the [place]  National Suicide Prevention Lifeline: 913-399-8609 or TTY: 2695812844 TTY (614)150-7053) to talk to a trained counselor call 1-800-273-TALK (toll free, 24 hour hotline) call the [facility]: [phone] call 911 if you are experiencing a Mental Health or Behavioral Health Crisis or need someone to talk to.  Rosina Forte, BSN RN [facility], [facility] RN Care Manager Direct Dial: [phone]  Fax: [phone]

## 2024-02-03 NOTE — Telephone Encounter (Signed)
 Spoke to patient, he was confused on who to call to make an eye dr appointment

## 2024-02-09 ENCOUNTER — Telehealth: Payer: Self-pay

## 2024-02-09 NOTE — Telephone Encounter (Signed)
 Copied from CRM #[id number]. Topic: Clinical - Lab/Test Results >> Feb 09, 2024 10:43 AM Russell PARAS wrote: Reason for CRM:   Pt is contacting clinic after receiving letter with [facility] results. Requesting a call back to go over results and answer any questions he may have.  CB#  [phone], please leave detailed message if no answer   Routing to [facility] nurses.

## 2024-02-09 NOTE — Telephone Encounter (Signed)
 Spoke with patient and reviewed findings on lung cancer screening CT scan. Patient verbalized understanding and had no further questions.

## 2024-02-11 ENCOUNTER — Encounter: Payer: Self-pay | Admitting: Podiatry

## 2024-02-11 ENCOUNTER — Ambulatory Visit (INDEPENDENT_AMBULATORY_CARE_PROVIDER_SITE_OTHER): Admitting: Podiatry

## 2024-02-11 DIAGNOSIS — M79672 Pain in left foot: Secondary | ICD-10-CM

## 2024-02-11 DIAGNOSIS — M79671 Pain in right foot: Secondary | ICD-10-CM

## 2024-02-11 DIAGNOSIS — B351 Tinea unguium: Secondary | ICD-10-CM | POA: Diagnosis not present

## 2024-02-11 NOTE — Progress Notes (Signed)
 Patient presents for evaluation and treatment of tenderness and some redness around nails feet.  Tenderness around toes with walking and wearing shoes.  Physical exam:  General appearance: Alert, pleasant, and in no acute distress.  Vascular: Pedal pulses: DP 2/4 B/L, PT 0/4 B/L.  Moderate edema lower legs bilaterally  Neurological:    Dermatologic:  Nails thickened, disfigured, discolored 1-5 BL with subungual debris.  Redness and hypertrophic nail folds along nail folds bilaterally but no signs of drainage or infection.  Musculoskeletal:     Diagnosis: 1. Painful onychomycotic nails 1 through 5 bilaterally. 2. Pain toes 1 through 5 bilaterally.  Plan: Debrided onychomycotic nails 1 through 5 bilaterally.  Return 3 months [facility]

## 2024-02-19 ENCOUNTER — Ambulatory Visit: Admitting: Vascular Surgery

## 2024-02-19 ENCOUNTER — Ambulatory Visit (HOSPITAL_COMMUNITY)

## 2024-02-27 ENCOUNTER — Ambulatory Visit (INDEPENDENT_AMBULATORY_CARE_PROVIDER_SITE_OTHER): Admitting: Internal Medicine

## 2024-02-27 ENCOUNTER — Encounter: Payer: Self-pay | Admitting: Internal Medicine

## 2024-02-27 VITALS — BP 121/71 | HR 72 | Ht 70.0 in | Wt 221.2 lb

## 2024-02-27 DIAGNOSIS — E1159 Type 2 diabetes mellitus with other circulatory complications: Secondary | ICD-10-CM | POA: Diagnosis not present

## 2024-02-27 DIAGNOSIS — I10 Essential (primary) hypertension: Secondary | ICD-10-CM | POA: Diagnosis not present

## 2024-02-27 DIAGNOSIS — F330 Major depressive disorder, recurrent, mild: Secondary | ICD-10-CM

## 2024-02-27 DIAGNOSIS — Z7984 Long term (current) use of oral hypoglycemic drugs: Secondary | ICD-10-CM

## 2024-02-27 DIAGNOSIS — N1831 Chronic kidney disease, stage 3a: Secondary | ICD-10-CM | POA: Diagnosis not present

## 2024-02-27 DIAGNOSIS — F17210 Nicotine dependence, cigarettes, uncomplicated: Secondary | ICD-10-CM

## 2024-02-27 DIAGNOSIS — Z720 Tobacco use: Secondary | ICD-10-CM

## 2024-02-27 MED ORDER — BUPROPION HCL ER (XL) 300 MG PO TB24: 300.0000 mg | ORAL_TABLET | Freq: Every day | ORAL | 1 refills | Status: AC

## 2024-02-27 NOTE — Assessment & Plan Note (Signed)
 Lab Results  Component Value Date   HGBA1C 6.1 (H) 10/28/2023   Well-controlled Associated with PAD, HTN and HLD On glipizide  5 mg BID, metformin  500 mg QD and Jardiance  10 mg QD Needs to check blood glucose regularly -advised to contact if blood glucose less than 70 Advised to follow diabetic diet On statin and ARB F/u CMP and lipid panel Diabetic eye exam: Advised to follow up with Ophthalmology for diabetic eye exam

## 2024-02-27 NOTE — Assessment & Plan Note (Addendum)
 Uncontrolled now On Wellbutrin  150 mg QD for MDD and anxiety, symptoms had improved initially, but worse recently - increased dose to 300 mg once daily, Wellbutrin  can help with smoking cessation as well If persistent anxiety, may add Valium - explained that Valium would not help depression

## 2024-02-27 NOTE — Progress Notes (Signed)
 Established Patient Office Visit  Subjective:  Patient ID: Alex Marks, male    DOB: 1946/04/28  Age: 78 y.o. MRN: [id number]  CC:  Chief Complaint  Patient presents with   Diabetes    Follow up   Hypertension    Follow up    HPI Alex Marks is a 78 y.o. male with past medical history of HTN, PVD, hep C, type II DM, HLD and depression who presents for annual physical.  PVD:  He saw vascular surgeon in 10/24. He was placed on Pletal  for it.  He is taking aspirin  and statin. He improvement in bilateral leg weakness and pain upon walking.  He also has gait disturbance.  Denies any numbness or tingling currently.  He has weak DPA pulse. He had US  ABI done, which showed bilateral mild to moderate PAD.  He has also started taking fish oil  and states that it has been helping with his leg weakness (?).  HTN: BP is well-controlled. Takes amlodipine -olmesartan  10-40 mg QD regularly. Patient denies headache, dizziness, chest pain, dyspnea or palpitations.  Type II DM: He takes metformin , glipizide  and Jardiance  for it.  His last Hgb A1c was 6.1 in 04/25. He checks blood glucose at home now, usually between 100-130. He denies any fatigue, polyuria or polydipsia.  He reports chronic fatigue.  CKD: Last CMP showed GFR of 51, overall stable.  Denies any dysuria, hematuria, urinary hesitance or resistance.  MDD: He still reports anhedonia, anxiety/agitation and feeling lonely.  He has been taking Wellbutrin  regularly, and reports that it is helping him somewhat. He has tried multiple antidepressants in the past, but details are unknown. He reports that he had better response with Valium in the past.  He currently lives alone and does not have much support from family or friends.   Past Medical History:  Diagnosis Date   Anxiety    COPD (chronic obstructive pulmonary disease) (HCC)    Dark stools 07/08/2019   Depression    Depression    Frequency of urination    GAD (generalized anxiety  disorder)    GERD (gastroesophageal reflux disease)    Hepatitis C    s/p treatment with Dr. Burnette in 2017. HCV RNA not detected in April 2021   Hypercholesterolemia    Hypertension    PAD (peripheral artery disease) (HCC)    Scalp lesion 04/12/2019   Substance abuse (HCC)    Transfusion of blood product refused for religious reason    Type II diabetes mellitus [facility])     Past Surgical History:  Procedure Laterality Date   ABDOMINAL AORTOGRAM W/LOWER EXTREMITY Bilateral 05/30/2017   ABDOMINAL AORTOGRAM W/LOWER EXTREMITY N/A 05/30/2017   Procedure: ABDOMINAL AORTOGRAM W/LOWER EXTREMITY;  Surgeon: Harvey Carlin BRAVO, MD;  Location: [facility] IN[facility];  Service: Cardiovascular;  Laterality: N/A;  Bilateral    Family History  Problem Relation Age of Onset   Alzheimer's disease Mother    Mental illness Mother        Depression   Breast cancer Mother    Cancer Father        bladder?   Hypertension Father    Alcohol abuse Father    Heart disease Sister    Hearing loss Sister        valve   Alcohol abuse Brother    Diabetes Paternal Aunt    COPD Sister    Alcohol abuse Brother    Alcohol abuse Brother    Alcohol abuse Brother  Early death Brother        MVA   Cancer Maternal Grandmother    Colon cancer Neg Hx    Liver cancer Neg Hx     Social History   Socioeconomic History   Marital status: Single    Spouse name: Not on file   Number of children: 0   Years of education: 11   Highest education level: Not on file  Occupational History   Occupation: retired    Comment: labor  Tobacco Use   Smoking status: Every Day    Current packs/day: 0.00    Average packs/day: 1 pack/day for 60.0 years (60.0 ttl pk-yrs)    Types: Cigarettes    Start date: 07/01/1961    Last attempt to quit: 01/29/2021    Years since quitting: 3.0   Smokeless tobacco: Never   Tobacco comments:    Has tried to quit, but his depressed mood makes it challenging to quit 1 ppd as of 10/28/23   Vaping Use   Vaping status: Never Used  Substance and Sexual Activity   Alcohol use: Not Currently    Comment: history of heavy alcohol use; last drank alcohol about 3 years ago   Drug use: Not Currently    Types: Marijuana, Cocaine, Heroin, Crack cocaine    Comment: Last used heroin in his 66s. Last used cocaine and marijuana about 3 years ago.    Sexual activity: Not Currently  Other Topics Concern   Not on file  Social History Narrative   Single.Live in apartment home   Never had license   Likes to read   Social Drivers of Health   Financial Resource Strain: Low Risk  (09/22/2023)   Overall Financial Resource Strain (CARDIA)    Difficulty of Paying Living Expenses: Not hard at all  Food Insecurity: No Food Insecurity (02/02/2024)   Hunger Vital Sign    Worried About Running Out of Food in the Last Year: Never true    Ran Out of Food in the Last Year: Never true  Transportation Needs: Unmet Transportation Needs (12/18/2023)   PRAPARE - Administrator, Civil Service (Medical): Yes    Lack of Transportation (Non-Medical): Yes  Physical Activity: Inactive (09/22/2023)   Exercise Vital Sign    Days of Exercise per Week: 0 days    Minutes of Exercise per Session: 0 min  Stress: No Stress Concern Present (09/22/2023)   [employer] of Occupational Health - Occupational Stress Questionnaire    Feeling of Stress : Only a little  Social Connections: Socially Isolated (09/22/2023)   Social Connection and Isolation Panel    Frequency of Communication with Friends and Family: Twice a week    Frequency of Social Gatherings with Friends and Family: Never    Attends Religious Services: Never    Database administrator or Organizations: No    Attends Banker Meetings: Never    Marital Status: Never married  Intimate Partner Violence: Not At Risk (02/02/2024)   Humiliation, Afraid, Rape, and Kick questionnaire    Fear of Current or Ex-Partner: No    Emotionally  Abused: No    Physically Abused: No    Sexually Abused: No    Outpatient Medications Prior to Visit  Medication Sig Dispense Refill   amLODipine -olmesartan  (AZOR ) 10-40 MG tablet Take 1 tablet by mouth daily. 100 tablet 3   aspirin  81 MG chewable tablet Chew 1 tablet by mouth daily.     atorvastatin  (LIPITOR) 20  MG tablet Take 1 tablet (20 mg total) by mouth daily. 100 tablet 3   Blood Glucose Monitoring Suppl DEVI 1 each by Does not apply route in the morning, at noon, and at bedtime. May substitute to any manufacturer covered by patient's insurance. 1 each 0   Carboxymethylcell-Glycerin PF (REFRESH OPTIVE PF) 0.5-0.9 % SOLN Place 2 drops into both eyes daily as needed (Irritation).     Cholecalciferol  (VITAMIN D3) 25 MCG (1000 UT) CAPS Take 1 capsule (1,000 Units total) by mouth in the morning and at bedtime. 60 capsule 0   cilostazol  (PLETAL ) 100 MG tablet Take 1 tablet (100 mg total) by mouth 2 (two) times daily before a meal. 60 tablet 11   empagliflozin  (JARDIANCE ) 10 MG TABS tablet TAKE 1 TABLET(10 MG) BY MOUTH DAILY BEFORE BREAKFAST 30 tablet 5   erythromycin  ophthalmic ointment Place a 1/2 inch ribbon of ointment into the left lower eyelid BID prn. 3.5 g 0   fexofenadine (ALLEGRA) 180 MG tablet Take 180 mg by mouth daily.     glipiZIDE  (GLUCOTROL ) 5 MG tablet Take 1 tablet (5 mg total) by mouth 2 (two) times daily before a meal. 180 tablet 1   Glucose Blood (BLOOD GLUCOSE TEST STRIPS) STRP 1 each by In Vitro route in the morning, at noon, and at bedtime. May substitute to any manufacturer covered by patient's insurance. 100 strip 3   Lancets Misc. MISC 1 each by Does not apply route in the morning, at noon, and at bedtime. May substitute to any manufacturer covered by patient's insurance. 100 each 3   loratadine (CLARITIN) 10 MG tablet Take 10 mg by mouth daily.     metFORMIN  (GLUCOPHAGE ) 500 MG tablet Take 1 tablet (500 mg total) by mouth daily with breakfast. 180 tablet 3   Multiple  Vitamin (MULTIVITAMIN WITH MINERALS) TABS tablet Take 1 tablet by mouth daily. [facility]     olopatadine  (PATADAY ) 0.1 % ophthalmic solution Place 1 drop into both eyes 2 (two) times daily. 5 mL 0   Omega-3 Fatty Acids (FISH OIL  PEARLS) 300 MG CAPS Take 600 mg by mouth 2 (two) times daily. 60 capsule 3   oxymetazoline (NASAL SPRAY MOISTURIZING 12 HR) 0.05 % nasal spray Place 1 spray into both nostrils daily as needed for congestion.     vitamin C (ASCORBIC ACID) 500 MG tablet Take 500 mg by mouth at bedtime.     buPROPion  (WELLBUTRIN  XL) 150 MG 24 hr tablet Take 1 tablet (150 mg total) by mouth daily. 90 tablet 1   No facility-administered medications prior to visit.    Allergies  Allergen Reactions   Onion     Bags under eyes, indigestion    Other Swelling    Hot sauce -- causes eye swelling Spicy food    ROS Review of Systems  Constitutional:  Positive for fatigue. Negative for chills and fever.  HENT:  Negative for congestion and sore throat.   Eyes:  Negative for pain and discharge.  Respiratory:  Negative for cough and shortness of breath.   Cardiovascular:  Negative for chest pain and palpitations.  Gastrointestinal:  Negative for diarrhea, nausea and vomiting.  Endocrine: Negative for polydipsia and polyuria.  Genitourinary:  Negative for dysuria and hematuria.  Musculoskeletal:  Positive for arthralgias, gait problem and myalgias. Negative for neck pain and neck stiffness.  Skin:  Negative for rash.  Neurological:  Negative for dizziness, weakness, numbness and headaches.  Psychiatric/Behavioral:  Positive for sleep disturbance. Negative for  agitation, behavioral problems and suicidal ideas. The patient is nervous/anxious.       Objective:    Physical Exam Vitals reviewed.  Constitutional:      General: He is not in acute distress.    Appearance: He is not diaphoretic.  HENT:     Head: Normocephalic and atraumatic.     Nose: Nose normal.     Mouth/Throat:      Mouth: Mucous membranes are moist.  Eyes:     General: No scleral icterus.    Extraocular Movements: Extraocular movements intact.  Cardiovascular:     Rate and Rhythm: Normal rate and regular rhythm.     Heart sounds: Normal heart sounds. No murmur heard. Pulmonary:     Breath sounds: Normal breath sounds. No wheezing or rales.  Musculoskeletal:     Cervical back: Neck supple. No tenderness.     Right lower leg: No edema.     Left lower leg: No edema.  Skin:    General: Skin is warm.     Findings: No rash.  Neurological:     General: No focal deficit present.     Mental Status: He is alert and oriented to person, place, and time. Mental status is at baseline.     Cranial Nerves: No cranial nerve deficit.     Sensory: No sensory deficit.     Motor: Weakness (4/5 in b/l LE) present.  Psychiatric:        Mood and Affect: Mood normal.        Behavior: Behavior normal.     BP 121/71   Pulse 72   Ht 5' 10 (1.778 m)   Wt 221 lb 3.2 oz (100.3 kg)   SpO2 96%   BMI 31.74 kg/m  Wt Readings from Last 3 Encounters:  02/27/24 221 lb 3.2 oz (100.3 kg)  10/28/23 224 lb 12.8 oz (102 kg)  08/26/23 227 lb (103 kg)    Lab Results  Component Value Date   TSH 4.030 07/31/2023   Lab Results  Component Value Date   WBC 10.6 07/31/2023   HGB 16.1 07/31/2023   HCT 48.2 07/31/2023   MCV 90 07/31/2023   PLT 256 07/31/2023   Lab Results  Component Value Date   NA 139 10/28/2023   K 4.7 10/28/2023   CO2 16 (L) 10/28/2023   GLUCOSE 97 10/28/2023   BUN 16 10/28/2023   CREATININE 1.42 (H) 10/28/2023   BILITOT 0.4 10/28/2023   ALKPHOS 141 (H) 10/28/2023   AST 39 10/28/2023   ALT 62 (H) 10/28/2023   PROT 7.4 10/28/2023   ALBUMIN 4.7 10/28/2023   CALCIUM  9.7 10/28/2023   ANIONGAP 11 11/16/2019   EGFR 51 (L) 10/28/2023   Lab Results  Component Value Date   CHOL 97 (L) 10/28/2023   Lab Results  Component Value Date   HDL 34 (L) 10/28/2023   Lab Results  Component Value  Date   LDLCALC 51 10/28/2023   Lab Results  Component Value Date   TRIG 48 10/28/2023   Lab Results  Component Value Date   CHOLHDL 2.9 10/28/2023   Lab Results  Component Value Date   HGBA1C 6.1 (H) 10/28/2023      Assessment & Plan:   Problem List Items Addressed This Visit       Cardiovascular and Mediastinum   Essential hypertension - Primary   BP Readings from Last 1 Encounters:  02/27/24 121/71   Well-controlled with amlodipine -olmesartan  10-40 mg QD now Counseled  for compliance with the medications Advised DASH diet and moderate exercise/walking, at least 150 mins/week        Endocrine   Type 2 diabetes mellitus (HCC) with other circulatory complication   Lab Results  Component Value Date   HGBA1C 6.1 (H) 10/28/2023   Well-controlled Associated with PAD, HTN and HLD On glipizide  5 mg BID, metformin  500 mg QD and Jardiance  10 mg QD Needs to check blood glucose regularly -advised to contact if blood glucose less than 70 Advised to follow diabetic diet On statin and ARB F/u CMP and lipid panel Diabetic eye exam: Advised to follow up with Ophthalmology for diabetic eye exam      Relevant Orders   Urine Microalbumin w/creat. ratio   Hemoglobin A1c   Basic Metabolic Panel (BMET)     Genitourinary   Stage 3a chronic kidney disease (HCC)   Last CMP showed GFR of 51, overall stable Checked urine microalbumin/creatinine ratio - On ARB and Jardiance  Avoid nephrotoxic agents Advised to maintain adequate hydration -needs to cut down soft drinks intake      Relevant Orders   Urine Microalbumin w/creat. ratio   CBC with Differential/Platelet   Basic Metabolic Panel (BMET)     Other   Tobacco abuse   Smokes about 0.5 pack/day now  Asked about quitting: confirms that he/she currently smokes cigarettes Advise to quit smoking: Educated about QUITTING to reduce the risk of cancer, cardio and cerebrovascular disease. Assess willingness: Unwilling to quit at  this time, but is working on cutting back.  On Wellbutrin  for MDD and anxiety. Assist with counseling and pharmacotherapy: Counseled for 5 minutes and literature provided. Arrange for follow up: follow up in 3 months and continue to offer help.      Recurrent depressive disorder, current episode mild (HCC)   Uncontrolled now On Wellbutrin  150 mg QD for MDD and anxiety, symptoms had improved initially, but worse recently - increased dose to 300 mg once daily, Wellbutrin  can help with smoking cessation as well If persistent anxiety, may add Valium - explained that Valium would not help depression      Relevant Medications   buPROPion  (WELLBUTRIN  XL) 300 MG 24 hr tablet      Meds ordered this encounter  Medications   buPROPion  (WELLBUTRIN  XL) 300 MG 24 hr tablet    Sig: Take 1 tablet (300 mg total) by mouth daily.    Dispense:  90 tablet    Refill:  1    Follow-up: Return in about 4 months (around 06/28/2024) for DM and CKD.    Suzzane MARLA Blanch, MD

## 2024-02-27 NOTE — Assessment & Plan Note (Signed)
 BP Readings from Last 1 Encounters:  02/27/24 121/71   Well-controlled with amlodipine -olmesartan  10-40 mg QD now Counseled for compliance with the medications Advised DASH diet and moderate exercise/walking, at least 150 mins/week

## 2024-02-27 NOTE — Assessment & Plan Note (Signed)
 Smokes about 0.5 pack/day now  Asked about quitting: confirms that he/she currently smokes cigarettes Advise to quit smoking: Educated about QUITTING to reduce the risk of cancer, cardio and cerebrovascular disease. Assess willingness: Unwilling to quit at this time, but is working on cutting back.  On Wellbutrin  for MDD and anxiety. Assist with counseling and pharmacotherapy: Counseled for 5 minutes and literature provided. Arrange for follow up: follow up in 3 months and continue to offer help.

## 2024-02-27 NOTE — Patient Instructions (Addendum)
 Please start taking Wellbutrin  300 mg once daily.  Please call [phone] to schedule appointment with Vascular surgery.  Please continue to take medications as prescribed.  Please continue to follow low carb diet and perform moderate exercise/walking as tolerated.

## 2024-02-27 NOTE — Assessment & Plan Note (Signed)
 Last CMP showed GFR of 51, overall stable Checked urine microalbumin/creatinine ratio - On ARB and Jardiance  Avoid nephrotoxic agents Advised to maintain adequate hydration -needs to cut down soft drinks intake

## 2024-02-28 LAB — CBC WITH DIFFERENTIAL/PLATELET
Basophils Absolute: 0.1 x10E3/uL (ref 0.0–0.2)
Basos: 1 %
EOS (ABSOLUTE): 0.3 x10E3/uL (ref 0.0–0.4)
Eos: 3 %
Hematocrit: 48 % (ref 37.5–51.0)
Hemoglobin: 15.4 g/dL (ref 13.0–17.7)
Immature Grans (Abs): 0 x10E3/uL (ref 0.0–0.1)
Immature Granulocytes: 0 %
Lymphocytes Absolute: 2.6 x10E3/uL (ref 0.7–3.1)
Lymphs: 24 %
MCH: 29.2 pg (ref 26.6–33.0)
MCHC: 32.1 g/dL (ref 31.5–35.7)
MCV: 91 fL (ref 79–97)
Monocytes Absolute: 0.9 x10E3/uL (ref 0.1–0.9)
Monocytes: 9 %
Neutrophils Absolute: 6.8 x10E3/uL (ref 1.4–7.0)
Neutrophils: 63 %
Platelets: 258 x10E3/uL (ref 150–450)
RBC: 5.27 x10E6/uL (ref 4.14–5.80)
RDW: 13.1 % (ref 11.6–15.4)
WBC: 10.7 x10E3/uL (ref 3.4–10.8)

## 2024-02-28 LAB — BASIC METABOLIC PANEL WITH GFR
BUN/Creatinine Ratio: 13 (ref 10–24)
BUN: 18 mg/dL (ref 8–27)
CO2: 15 mmol/L — ABNORMAL LOW (ref 20–29)
Calcium: 9.6 mg/dL (ref 8.6–10.2)
Chloride: 104 mmol/L (ref 96–106)
Creatinine, Ser: 1.4 mg/dL — ABNORMAL HIGH (ref 0.76–1.27)
Glucose: 89 mg/dL (ref 70–99)
Potassium: 4.5 mmol/L (ref 3.5–5.2)
Sodium: 140 mmol/L (ref 134–144)
eGFR: 52 mL/min/1.73 — ABNORMAL LOW (ref >59)

## 2024-02-28 LAB — HEMOGLOBIN A1C
Est. average glucose Bld gHb Est-mCnc: 123 mg/dL
Hgb A1c MFr Bld: 5.9 % — ABNORMAL HIGH (ref 4.8–5.6)

## 2024-02-29 LAB — MICROALBUMIN / CREATININE URINE RATIO
Creatinine, Urine: 20.8 mg/dL
Microalb/Creat Ratio: 251 mg/g{creat} — ABNORMAL HIGH (ref 0–29)
Microalbumin, Urine: 52.3 ug/mL

## 2024-03-01 ENCOUNTER — Ambulatory Visit: Payer: Self-pay | Admitting: Internal Medicine

## 2024-03-03 ENCOUNTER — Encounter: Payer: Self-pay | Admitting: *Deleted

## 2024-03-03 ENCOUNTER — Telehealth: Payer: Self-pay | Admitting: *Deleted

## 2024-03-03 NOTE — Patient Instructions (Signed)
 Rosendo LITTIE Hurst - I am sorry I was unable to reach you today for our scheduled appointment. I work with Tobie, Suzzane POUR, MD and am calling to support your healthcare needs. Please contact me at [phone] at your earliest convenience. I look forward to speaking with you soon.   Thank you,  Rosina Forte, BSN RN [facility], [facility] RN Care Manager Direct Dial: [phone]  Fax: [phone]

## 2024-03-22 ENCOUNTER — Other Ambulatory Visit: Payer: Self-pay | Admitting: Vascular Surgery

## 2024-03-22 ENCOUNTER — Other Ambulatory Visit: Payer: Self-pay

## 2024-03-22 MED ORDER — CILOSTAZOL 100 MG PO TABS
100.0000 mg | ORAL_TABLET | Freq: Two times a day (BID) | ORAL | 0 refills | Status: DC
Start: 2024-03-22 — End: 2024-04-01

## 2024-03-23 ENCOUNTER — Telehealth: Payer: Self-pay | Admitting: *Deleted

## 2024-03-26 ENCOUNTER — Other Ambulatory Visit: Payer: Self-pay | Admitting: Vascular Surgery

## 2024-04-01 ENCOUNTER — Other Ambulatory Visit: Payer: Self-pay | Admitting: Vascular Surgery

## 2024-04-01 NOTE — Telephone Encounter (Signed)
 Pt called and wanted to see if tx was sent to pharmacy and it had.

## 2024-04-08 ENCOUNTER — Encounter (HOSPITAL_COMMUNITY)

## 2024-04-08 ENCOUNTER — Ambulatory Visit: Admitting: Vascular Surgery

## 2024-04-13 ENCOUNTER — Other Ambulatory Visit: Payer: Self-pay | Admitting: *Deleted

## 2024-04-13 NOTE — Patient Outreach (Signed)
 Complex Care Management   Visit Note  04/13/2024  Name:  Alex Marks MRN: [id number] DOB: 1945-08-13  Situation: Referral received for Complex Care Management related to Diabetes with Complications I obtained verbal consent from Patient.  Visit completed with Patient  on the phone  Background:   Past Medical History:  Diagnosis Date   Anxiety    COPD (chronic obstructive pulmonary disease) (HCC)    Dark stools 07/08/2019   Depression    Depression    Frequency of urination    GAD (generalized anxiety disorder)    GERD (gastroesophageal reflux disease)    Hepatitis C    s/p treatment with Dr. Burnette in 2017. HCV RNA not detected in April 2021   Hypercholesterolemia    Hypertension    PAD (peripheral artery disease)    Scalp lesion 04/12/2019   Substance abuse (HCC)    Transfusion of blood product refused for religious reason    Type II diabetes mellitus (HCC)     Assessment: Patient Reported Symptoms:  Cognitive Cognitive Status: No symptoms reported Cognitive/Intellectual Conditions Management [RPT]: None reported or documented in medical history or problem list   Health Maintenance Behaviors: Annual physical exam Healing Pattern: Average Health Facilitated by: Rest  Neurological Neurological Review of Symptoms: No symptoms reported Neurological Self-Management Outcome: 4 (good)  HEENT HEENT Symptoms Reported: No symptoms reported HEENT Management Strategies: Routine screening HEENT Self-Management Outcome: 4 (good)    Cardiovascular Cardiovascular Symptoms Reported: No symptoms reported Does patient have uncontrolled Hypertension?: No Cardiovascular Self-Management Outcome: 4 (good)  Respiratory Respiratory Symptoms Reported: No symptoms reported Respiratory Self-Management Outcome: 4 (good)  Endocrine Endocrine Symptoms Reported: No symptoms reported Is patient diabetic?: Yes Is patient checking blood sugars at home?: No Endocrine Self-Management Outcome: 4  (good)  Gastrointestinal Gastrointestinal Symptoms Reported: No symptoms reported Gastrointestinal Self-Management Outcome: 4 (good) Nutrition Risk Screen (CP): No indicators present  Genitourinary Genitourinary Symptoms Reported: No symptoms reported Genitourinary Self-Management Outcome: 4 (good)  Integumentary Integumentary Symptoms Reported: No symptoms reported Skin Management Strategies: Routine screening Skin Self-Management Outcome: 4 (good)  Musculoskeletal Musculoskelatal Symptoms Reviewed: Weakness Musculoskeletal Management Strategies: Routine screening, Medical device Musculoskeletal Self-Management Outcome: 4 (good) Falls in the past year?: No Patient at Risk for Falls Due to: No Fall Risks Fall risk Follow up: Falls evaluation completed  Psychosocial Psychosocial Symptoms Reported: No symptoms reported Behavioral Management Strategies: Coping strategies Major Change/Loss/Stressor/Fears (CP): Denies Techniques to Cope with Loss/Stress/Change: None Quality of Family Relationships: non-existent    04/13/2024    PHQ2-9 Depression Screening   Little interest or pleasure in doing things Nearly every day  Feeling down, depressed, or hopeless More than half the days  PHQ-2 - Total Score 5  Trouble falling or staying asleep, or sleeping too much More than half the days  Feeling tired or having little energy More than half the days  Poor appetite or overeating  Not at all  Feeling bad about yourself - or that you are a failure or have let yourself or your family down Not at all  Trouble concentrating on things, such as reading the newspaper or watching television More than half the days  Moving or speaking so slowly that other people could have noticed.  Or the opposite - being so fidgety or restless that you have been moving around a lot more than usual Not at all  Thoughts that you would be better off dead, or hurting yourself in some way Not at all  PHQ2-9 Total Score 11  If you checked off any problems, how difficult have these problems made it for you to do your work, take care of things at home, or get along with other people Not difficult at all  Depression Interventions/Treatment      There were no vitals filed for this visit.  Medications Reviewed Today     Reviewed by Bertrum Rosina HERO, RN (Registered Nurse) on 04/13/24 at 1110  Med List Status: <None>   Medication Order Taking? Sig Documenting Provider Last Dose Status Informant  amLODipine -olmesartan  (AZOR ) 10-40 MG tablet [id number] Yes Take 1 tablet by mouth daily. Tobie Suzzane POUR, MD  Active   aspirin  81 MG chewable tablet [id number] Yes Chew 1 tablet by mouth daily. [provider]  Active   atorvastatin  (LIPITOR) 20 MG tablet [id number] Yes Take 1 tablet (20 mg total) by mouth daily. Tobie Suzzane POUR, MD  Active   Blood Glucose Monitoring Suppl DEVI [id number] Yes 1 each by Does not apply route in the morning, at noon, and at bedtime. May substitute to any manufacturer covered by patient's insurance. Tobie Suzzane POUR, MD  Active   buPROPion  (WELLBUTRIN  XL) 300 MG 24 hr tablet [id number] Yes Take 1 tablet (300 mg total) by mouth daily. Tobie Suzzane POUR, MD  Active   Carboxymethylcell-Glycerin PF (REFRESH OPTIVE PF) 0.5-0.9 % SOLN [id number] Yes Place 2 drops into both eyes daily as needed (Irritation). [provider]  Active Self  Cholecalciferol  (VITAMIN D3) 25 MCG (1000 UT) CAPS [id number] Yes Take 1 capsule (1,000 Units total) by mouth in the morning and at bedtime. Elnor Lauraine BRAVO, NP  Active            Med Note GARNET, Merl Bommarito M   Mon Oct 06, 2023  9:24 AM) Patient taking differently. Takes one capsule daily  cilostazol  (PLETAL ) 100 MG tablet [id number] Yes Take 1 tablet (100 mg total) by mouth 2 (two) times daily before a meal. Lanis Fonda BRAVO, MD  Active   empagliflozin  (JARDIANCE ) 10 MG TABS tablet [id number] Yes TAKE 1 TABLET(10 MG) BY MOUTH DAILY BEFORE BREAKFAST Patel, Rutwik K, MD   Active   erythromycin  ophthalmic ointment [id number] Yes Place a 1/2 inch ribbon of ointment into the left lower eyelid BID prn. Stuart Vernell Norris, PA-C  Active   fexofenadine (ALLEGRA) 180 MG tablet [id number] Yes Take 180 mg by mouth daily. [provider]  Active Self  glipiZIDE  (GLUCOTROL ) 5 MG tablet [id number] Yes Take 1 tablet (5 mg total) by mouth 2 (two) times daily before a meal. Tobie Suzzane POUR, MD  Active   Glucose Blood (BLOOD GLUCOSE TEST STRIPS) STRP [id number] Yes 1 each by In Vitro route in the morning, at noon, and at bedtime. May substitute to any manufacturer covered by patient's insurance. Tobie Suzzane POUR, MD  Active   Lancets Misc. MISC [id number] Yes 1 each by Does not apply route in the morning, at noon, and at bedtime. May substitute to any manufacturer covered by patient's insurance. Tobie Suzzane POUR, MD  Active   loratadine (CLARITIN) 10 MG tablet [id number] Yes Take 10 mg by mouth daily. [provider]  Active Self  metFORMIN  (GLUCOPHAGE ) 500 MG tablet [id number] Yes Take 1 tablet (500 mg total) by mouth daily with breakfast. Tobie Suzzane POUR, MD  Active   Multiple Vitamin (MULTIVITAMIN WITH MINERALS) TABS tablet [id number] Yes Take 1 tablet by mouth daily. [employer] copy, Historical, MD  Active Self  olopatadine  (PATADAY ) 0.1 % ophthalmic solution [id number] Yes Place 1  drop into both eyes 2 (two) times daily. Stuart Vernell Norris, [place]  Active   Omega-3 Fatty Acids (FISH OIL  PEARLS) 300 MG CAPS [id number] Yes Take 600 mg by mouth 2 (two) times daily. Gosrani, Nimish C, MD  Active Self  oxymetazoline (NASAL SPRAY MOISTURIZING 12 HR) 0.05 % nasal spray [id number] Yes Place 1 spray into both nostrils daily as needed for congestion. [provider]  Active Self  vitamin C (ASCORBIC ACID) 500 MG tablet [id number] Yes Take 500 mg by mouth at bedtime. [provider]  Active Self            Recommendation:   Continue Current Plan of  Care  Follow Up Plan:   Closing From:  Complex Care Management Patient has met all care management goals. Care Management case will be closed. Patient has been provided contact information should new needs arise.   Rosina Forte, BSN RN [facility], [facility] RN Care Manager Direct Dial: [phone]  Fax: [phone]

## 2024-04-13 NOTE — Patient Instructions (Signed)
 Visit Information  Thank you for taking time to visit with me today. Please don't hesitate to contact me if I can be of assistance to you before our next scheduled appointment.  Your next care management appointment is no further scheduled appointments.    Closing From: Complex Care Management. Patient has met all care management goals. Care Management case will be closed. Patient has been provided contact information should new needs arise.   Please call the care guide team at [phone] if you need to cancel, schedule, or reschedule an appointment.   Please call the Suicide and Crisis Lifeline: 988 call the [place]  National Suicide Prevention Lifeline: 930-265-7590 or TTY: 540-771-0638 TTY 563-023-0381) to talk to a trained counselor call 1-800-273-TALK (toll free, 24 hour hotline) if you are experiencing a Mental Health or Behavioral Health Crisis or need someone to talk to.  Rosina Forte, BSN RN [facility], [facility] RN Care Manager Direct Dial: [phone]  Fax: [phone]

## 2024-04-21 ENCOUNTER — Other Ambulatory Visit: Payer: Self-pay | Admitting: Internal Medicine

## 2024-04-21 DIAGNOSIS — F330 Major depressive disorder, recurrent, mild: Secondary | ICD-10-CM

## 2024-06-01 ENCOUNTER — Encounter: Payer: Self-pay | Admitting: Podiatry

## 2024-06-01 ENCOUNTER — Ambulatory Visit: Admitting: Podiatry

## 2024-06-01 DIAGNOSIS — M79674 Pain in right toe(s): Secondary | ICD-10-CM

## 2024-06-01 DIAGNOSIS — E1151 Type 2 diabetes mellitus with diabetic peripheral angiopathy without gangrene: Secondary | ICD-10-CM | POA: Diagnosis not present

## 2024-06-01 DIAGNOSIS — M79675 Pain in left toe(s): Secondary | ICD-10-CM

## 2024-06-01 DIAGNOSIS — I70209 Unspecified atherosclerosis of native arteries of extremities, unspecified extremity: Secondary | ICD-10-CM | POA: Diagnosis not present

## 2024-06-01 DIAGNOSIS — L84 Corns and callosities: Secondary | ICD-10-CM

## 2024-06-01 DIAGNOSIS — B351 Tinea unguium: Secondary | ICD-10-CM | POA: Diagnosis not present

## 2024-06-06 ENCOUNTER — Encounter: Payer: Self-pay | Admitting: Podiatry

## 2024-06-06 NOTE — Progress Notes (Signed)
 Subjective:  Patient ID: Alex Marks, male    DOB: [birth year],  MRN: [id number]  Alex Marks presents to clinic today for at risk foot care. Pt has h/o NIDDM with PAD and preulcerative lesion(s) of both feet and painful mycotic toenails that limit ambulation. Painful toenails interfere with ambulation. Aggravating factors include wearing enclosed shoe gear. Pain is relieved with periodic professional debridement. Painful preulcerative lesion(s) is/are aggravated when weightbearing with and without shoegear. Pain is relieved with periodic professional debridement. He is followed by [facility] for PAD. Chief Complaint  Patient presents with   Nail Problem    Thick painful toenails, 3 month follow up    Diabetes    A1C  5.9   New problem(s): None.   PCP is Tobie Suzzane POUR, MD.LOV 02/27/24.  Allergies  Allergen Reactions   Onion     Bags under eyes, indigestion    Other Swelling    Hot sauce -- causes eye swelling Spicy food    Review of Systems: Negative except as noted in the HPI.  Objective: No changes noted in today's physical examination. There were no vitals filed for this visit. Alex Marks is a pleasant 78 y.o. male in NAD. AAO x 3.  Vascular Examination: CFT <4 seconds b/l. DP pulses diminished b/l. PT pulses diminished b/l. Digital hair absent. No edema b/l..Skin temperature gradient warm to cool b/l. No ischemia or gangrene. No cyanosis or clubbing noted b/l.    Neurological Examination: Sensation grossly intact b/l with 10 gram monofilament. Vibratory sensation intact b/l.   Dermatological Examination: Pedal skin thin, shiny and atrophic b/l. No open wounds. No interdigital macerations.   Toenails 1-5 b/l thick, discolored, elongated with subungual debris and pain on dorsal palpation.   Preulcerative lesion noted plantar IPJ of right great toe, submet head 2 left foot, and submet head 3 left foot. There is visible subdermal hemorrhage. There is no surrounding  erythema, no edema, no drainage, no odor, no fluctuance.  Musculoskeletal Examination: Muscle strength 5/5 to all lower extremity muscle groups bilaterally. No pain, crepitus or joint limitation noted with ROM b/l LE. HAV with bunion bilaterally and hammertoes 2-5 b/l.Alex Marks Patient ambulates with cane assistance.  Radiographs: None  Last A1c:      Latest Ref Rng & Units 02/27/2024    9:30 AM 10/28/2023    9:54 AM 07/31/2023    8:46 AM  Hemoglobin A1C  Hemoglobin-A1c 4.8 - 5.6 % 5.9  6.1  7.0     Assessment/Plan: 1. Pain due to onychomycosis of toenails of both feet   2. Pre-ulcerative corn or callous   3. Diabetes mellitus type 2 with atherosclerosis of arteries of extremities [facility])   Consent given for treatment. All patient's and/or POA's questions/concerns addressed on today's visit. Toenails 1-5 b/l debrided in length and girth without incident. Preulcerative lesion(s) plantar IPJ of right great toe, submet head 2 left foot, and submet head 3 left foot pared with sharp debridement without incident. Continue foot and shoe inspections daily. Monitor blood glucose per PCP/Endocrinologist's recommendations.Continue soft, supportive shoe gear daily. Report any pedal injuries to medical professional. Call office if there are any quesitons/concerns.  Return in about 3 months (around 08/30/2024).  Alex Marks Merlin, DPM      [facility] LOCATION: [address]  [place], [place] [address]                   Office [phone]   [facility] LOCATION: [address] [place], [place] [address] Office [phone]

## 2024-06-13 ENCOUNTER — Other Ambulatory Visit: Payer: Self-pay | Admitting: Internal Medicine

## 2024-06-13 DIAGNOSIS — E1169 Type 2 diabetes mellitus with other specified complication: Secondary | ICD-10-CM

## 2024-06-21 ENCOUNTER — Telehealth: Payer: Self-pay

## 2024-06-21 NOTE — Telephone Encounter (Signed)
 Copied from CRM #[id number]. Topic: Clinical - Medication Question >> Jun 21, 2024  3:39 PM Rosaria E wrote: Reason for CRM: Pt has questions about cold/flu medication. Robitussin   Wants to speak to a nurse  Best contact: [phone]

## 2024-06-22 NOTE — Telephone Encounter (Signed)
 [email or web address]

## 2024-06-22 NOTE — Telephone Encounter (Signed)
Pt informed

## 2024-06-30 ENCOUNTER — Telehealth: Payer: Self-pay | Admitting: Pharmacy Technician

## 2024-06-30 ENCOUNTER — Ambulatory Visit: Admitting: Internal Medicine

## 2024-06-30 ENCOUNTER — Encounter: Payer: Self-pay | Admitting: Internal Medicine

## 2024-06-30 ENCOUNTER — Other Ambulatory Visit (HOSPITAL_COMMUNITY): Payer: Self-pay

## 2024-06-30 VITALS — BP 125/79 | HR 65 | Ht 70.0 in | Wt 216.8 lb

## 2024-06-30 DIAGNOSIS — E1159 Type 2 diabetes mellitus with other circulatory complications: Secondary | ICD-10-CM | POA: Diagnosis not present

## 2024-06-30 DIAGNOSIS — Z7984 Long term (current) use of oral hypoglycemic drugs: Secondary | ICD-10-CM | POA: Diagnosis not present

## 2024-06-30 DIAGNOSIS — E1151 Type 2 diabetes mellitus with diabetic peripheral angiopathy without gangrene: Secondary | ICD-10-CM

## 2024-06-30 DIAGNOSIS — H00015 Hordeolum externum left lower eyelid: Secondary | ICD-10-CM

## 2024-06-30 DIAGNOSIS — N1831 Chronic kidney disease, stage 3a: Secondary | ICD-10-CM

## 2024-06-30 DIAGNOSIS — E1122 Type 2 diabetes mellitus with diabetic chronic kidney disease: Secondary | ICD-10-CM

## 2024-06-30 DIAGNOSIS — J432 Centrilobular emphysema: Secondary | ICD-10-CM | POA: Diagnosis not present

## 2024-06-30 DIAGNOSIS — I10 Essential (primary) hypertension: Secondary | ICD-10-CM

## 2024-06-30 DIAGNOSIS — I739 Peripheral vascular disease, unspecified: Secondary | ICD-10-CM

## 2024-06-30 MED ORDER — KERENDIA 10 MG PO TABS: 1.0000 | ORAL_TABLET | Freq: Every day | ORAL | 0 refills | Status: AC

## 2024-06-30 MED ORDER — ERYTHROMYCIN 5 MG/GM OP OINT: TOPICAL_OINTMENT | OPHTHALMIC | 0 refills | Status: AC

## 2024-06-30 NOTE — Assessment & Plan Note (Signed)
 Noted on CT chest Has been trying to cut down smoking Asymptomatic currently

## 2024-06-30 NOTE — Progress Notes (Signed)
 "  Established Patient Office Visit  Subjective:  Patient ID: Alex Marks, male    DOB: 11-May-1946  Age: 78 y.o. MRN: [id number]  CC:  Chief Complaint  Patient presents with   Follow-up    4 month f/u     HPI Alex Marks is a 78 y.o. male with past medical history of HTN, PVD, hep C, type II DM, HLD and depression who presents for f/u of his chronic medical conditions.  PVD:  He saw vascular surgeon in 10/24. He was placed on Pletal  for it.  He is taking aspirin  and statin. He improvement in bilateral leg weakness and pain upon walking.  He also has gait disturbance.  Denies any numbness or tingling currently.  HTN: BP is well-controlled. Takes amlodipine -olmesartan  10-40 mg QD regularly. Patient denies headache, dizziness, chest pain, dyspnea or palpitations.  Type II DM: He takes metformin , glipizide  and Jardiance  for it.  His last HbA1c was 5.9 in 08/25. He checks blood glucose at home now, usually between 100-130. He denies any fatigue, polyuria or polydipsia.  He reports chronic fatigue.  CKD: Last CMP showed GFR of 51, overall stable.  Denies any dysuria, hematuria, urinary hesitance or resistance. He has proteinuria despite taking ARB and Jardiance .  MDD: He still reports intermittent anhedonia, anxiety/agitation and feeling lonely.  He has been taking Wellbutrin  regularly, and reports that it is helping him somewhat. He has tried multiple antidepressants in the past, but details are unknown. He reports that he had better response with Valium in the past.  He currently lives alone and does not have much support from family or friends.   Past Medical History:  Diagnosis Date   Anxiety    COPD (chronic obstructive pulmonary disease) (HCC)    Dark stools 07/08/2019   Depression    Depression    Frequency of urination    GAD (generalized anxiety disorder)    GERD (gastroesophageal reflux disease)    Hepatitis C    s/p treatment with Dr. Burnette in 2017. HCV RNA not detected  in April 2021   Hypercholesterolemia    Hypertension    PAD (peripheral artery disease)    Scalp lesion 04/12/2019   Substance abuse (HCC)    Transfusion of blood product refused for religious reason    Type II diabetes mellitus [facility])     Past Surgical History:  Procedure Laterality Date   ABDOMINAL AORTOGRAM W/LOWER EXTREMITY Bilateral 05/30/2017   ABDOMINAL AORTOGRAM W/LOWER EXTREMITY N/A 05/30/2017   Procedure: ABDOMINAL AORTOGRAM W/LOWER EXTREMITY;  Surgeon: Harvey Carlin BRAVO, MD;  Location: [facility];  Service: Cardiovascular;  Laterality: N/A;  Bilateral    Family History  Problem Relation Age of Onset   Alzheimer's disease Mother    Mental illness Mother        Depression   Breast cancer Mother    Cancer Father        bladder?   Hypertension Father    Alcohol abuse Father    Heart disease Sister    Hearing loss Sister        valve   Alcohol abuse Brother    Diabetes Paternal Aunt    COPD Sister    Alcohol abuse Brother    Alcohol abuse Brother    Alcohol abuse Brother    Early death Brother        MVA   Cancer Maternal Grandmother    Colon cancer Neg Hx    Liver cancer Neg Hx  Social History   Socioeconomic History   Marital status: Single    Spouse name: Not on file   Number of children: 0   Years of education: 11   Highest education level: Not on file  Occupational History   Occupation: retired    Comment: labor  Tobacco Use   Smoking status: Every Day    Current packs/day: 0.00    Average packs/day: 1 pack/day for 60.0 years (60.0 ttl pk-yrs)    Types: Cigarettes    Start date: 07/01/1961    Last attempt to quit: 01/29/2021    Years since quitting: 3.4   Smokeless tobacco: Never   Tobacco comments:    Has tried to quit, but his depressed mood makes it challenging to quit 1 ppd as of 10/28/23  Vaping Use   Vaping status: Never Used  Substance and Sexual Activity   Alcohol use: Not Currently    Comment: history of heavy alcohol use;  last drank alcohol about 3 years ago   Drug use: Not Currently    Types: Marijuana, Cocaine, Heroin, Crack cocaine    Comment: Last used heroin in his 60s. Last used cocaine and marijuana about 3 years ago.    Sexual activity: Not Currently  Other Topics Concern   Not on file  Social History Narrative   Single.Live in apartment home   Never had license   Likes to read   Social Drivers of Health   Tobacco Use: High Risk (06/30/2024)   Patient History    Smoking Tobacco Use: Every Day    Smokeless Tobacco Use: Never    Passive Exposure: Not on file  Financial Resource Strain: Low Risk (09/22/2023)   Overall Financial Resource Strain (CARDIA)    Difficulty of Paying Living Expenses: Not hard at all  Food Insecurity: No Food Insecurity (02/02/2024)   Epic    Worried About Programme Researcher, Broadcasting/film/video in the Last Year: Never true    Ran Out of Food in the Last Year: Never true  Transportation Needs: Unmet Transportation Needs (12/18/2023)   Epic    Lack of Transportation (Medical): Yes    Lack of Transportation (Non-Medical): Yes  Physical Activity: Inactive (09/22/2023)   Exercise Vital Sign    Days of Exercise per Week: 0 days    Minutes of Exercise per Session: 0 min  Stress: No Stress Concern Present (09/22/2023)   [employer] of Occupational Health - Occupational Stress Questionnaire    Feeling of Stress : Only a little  Social Connections: Socially Isolated (09/22/2023)   Social Connection and Isolation Panel    Frequency of Communication with Friends and Family: Twice a week    Frequency of Social Gatherings with Friends and Family: Never    Attends Religious Services: Never    Database Administrator or Organizations: No    Attends Banker Meetings: Never    Marital Status: Never married  Intimate Partner Violence: Not At Risk (02/02/2024)   Epic    Fear of Current or Ex-Partner: No    Emotionally Abused: No    Physically Abused: No    Sexually Abused: No   Depression (PHQ2-9): High Risk (06/30/2024)   Depression (PHQ2-9)    PHQ-2 Score: 11  Alcohol Screen: Low Risk (09/22/2023)   Alcohol Screen    Last Alcohol Screening Score (AUDIT): 0  Housing: Low Risk (02/02/2024)   Epic    Unable to Pay for Housing in the Last Year: No    Number  of Times Moved in the Last Year: 0    Homeless in the Last Year: No  Utilities: Not At Risk (02/02/2024)   Epic    Threatened with loss of utilities: No  Health Literacy: Adequate Health Literacy (09/22/2023)   B1300 Health Literacy    Frequency of need for help with medical instructions: Never    Outpatient Medications Prior to Visit  Medication Sig Dispense Refill   amLODipine -olmesartan  (AZOR ) 10-40 MG tablet Take 1 tablet by mouth daily. 100 tablet 3   aspirin  81 MG chewable tablet Chew 1 tablet by mouth daily.     atorvastatin  (LIPITOR) 20 MG tablet Take 1 tablet (20 mg total) by mouth daily. 100 tablet 3   Blood Glucose Monitoring Suppl DEVI 1 each by Does not apply route in the morning, at noon, and at bedtime. May substitute to any manufacturer covered by patient's insurance. 1 each 0   buPROPion  (WELLBUTRIN  XL) 300 MG 24 hr tablet Take 1 tablet (300 mg total) by mouth daily. 90 tablet 1   Carboxymethylcell-Glycerin PF (REFRESH OPTIVE PF) 0.5-0.9 % SOLN Place 2 drops into both eyes daily as needed (Irritation).     Cholecalciferol  (VITAMIN D3) 25 MCG (1000 UT) CAPS Take 1 capsule (1,000 Units total) by mouth in the morning and at bedtime. 60 capsule 0   cilostazol  (PLETAL ) 100 MG tablet Take 1 tablet (100 mg total) by mouth 2 (two) times daily before a meal. 60 tablet 0   empagliflozin  (JARDIANCE ) 10 MG TABS tablet TAKE 1 TABLET(10 MG) BY MOUTH DAILY BEFORE BREAKFAST 30 tablet 5   fexofenadine (ALLEGRA) 180 MG tablet Take 180 mg by mouth daily.     glipiZIDE  (GLUCOTROL ) 5 MG tablet Take 1 tablet (5 mg total) by mouth 2 (two) times daily before a meal. 180 tablet 1   Glucose Blood (BLOOD GLUCOSE TEST  STRIPS) STRP 1 each by In Vitro route in the morning, at noon, and at bedtime. May substitute to any manufacturer covered by patient's insurance. 100 strip 3   Lancets Misc. MISC 1 each by Does not apply route in the morning, at noon, and at bedtime. May substitute to any manufacturer covered by patient's insurance. 100 each 3   loratadine (CLARITIN) 10 MG tablet Take 10 mg by mouth daily.     metFORMIN  (GLUCOPHAGE ) 500 MG tablet Take 1 tablet (500 mg total) by mouth daily with breakfast. 180 tablet 3   Multiple Vitamin (MULTIVITAMIN WITH MINERALS) TABS tablet Take 1 tablet by mouth daily. [facility]rum     olopatadine  (PATADAY ) 0.1 % ophthalmic solution Place 1 drop into both eyes 2 (two) times daily. 5 mL 0   Omega-3 Fatty Acids (FISH OIL  PEARLS) 300 MG CAPS Take 600 mg by mouth 2 (two) times daily. 60 capsule 3   oxymetazoline (NASAL SPRAY MOISTURIZING 12 HR) 0.05 % nasal spray Place 1 spray into both nostrils daily as needed for congestion.     vitamin C (ASCORBIC ACID) 500 MG tablet Take 500 mg by mouth at bedtime.     erythromycin  ophthalmic ointment Place a 1/2 inch ribbon of ointment into the left lower eyelid BID prn. 3.5 g 0   No facility-administered medications prior to visit.    Allergies  Allergen Reactions   Onion     Bags under eyes, indigestion    Other Swelling    Hot sauce -- causes eye swelling Spicy food    ROS Review of Systems  Constitutional:  Positive for fatigue. Negative  for chills and fever.  HENT:  Negative for congestion and sore throat.   Eyes:  Negative for pain and discharge.  Respiratory:  Negative for cough and shortness of breath.   Cardiovascular:  Negative for chest pain and palpitations.  Gastrointestinal:  Negative for diarrhea, nausea and vomiting.  Endocrine: Negative for polydipsia and polyuria.  Genitourinary:  Negative for dysuria and hematuria.  Musculoskeletal:  Positive for arthralgias and gait problem. Negative for neck pain and neck  stiffness.  Skin:  Negative for rash.  Neurological:  Negative for dizziness, weakness, numbness and headaches.  Psychiatric/Behavioral:  Positive for sleep disturbance. Negative for agitation, behavioral problems and suicidal ideas. The patient is nervous/anxious.       Objective:    Physical Exam Vitals reviewed.  Constitutional:      General: He is not in acute distress.    Appearance: He is not diaphoretic.  HENT:     Head: Normocephalic and atraumatic.     Nose: Nose normal.     Mouth/Throat:     Mouth: Mucous membranes are moist.  Eyes:     General: No scleral icterus.       Left eye: Hordeolum present.    Extraocular Movements: Extraocular movements intact.  Cardiovascular:     Rate and Rhythm: Normal rate and regular rhythm.     Heart sounds: Normal heart sounds. No murmur heard. Pulmonary:     Breath sounds: Normal breath sounds. No wheezing or rales.  Musculoskeletal:     Cervical back: Neck supple. No tenderness.     Right lower leg: No edema.     Left lower leg: No edema.  Skin:    General: Skin is warm.     Findings: No rash.  Neurological:     General: No focal deficit present.     Mental Status: He is alert and oriented to person, place, and time. Mental status is at baseline.     Cranial Nerves: No cranial nerve deficit.     Sensory: No sensory deficit.     Motor: Weakness (4/5 in b/l LE) present.  Psychiatric:        Mood and Affect: Mood normal.        Behavior: Behavior normal.     BP 125/79   Pulse 65   Ht 5' 10 (1.778 m)   Wt 216 lb 12.8 oz (98.3 kg)   SpO2 98%   BMI 31.11 kg/m  Wt Readings from Last 3 Encounters:  06/30/24 216 lb 12.8 oz (98.3 kg)  02/27/24 221 lb 3.2 oz (100.3 kg)  10/28/23 224 lb 12.8 oz (102 kg)    Lab Results  Component Value Date   TSH 4.030 07/31/2023   Lab Results  Component Value Date   WBC 10.7 02/27/2024   HGB 15.4 02/27/2024   HCT 48.0 02/27/2024   MCV 91 02/27/2024   PLT 258 02/27/2024   Lab  Results  Component Value Date   NA 140 02/27/2024   K 4.5 02/27/2024   CO2 15 (L) 02/27/2024   GLUCOSE 89 02/27/2024   BUN 18 02/27/2024   CREATININE 1.40 (H) 02/27/2024   BILITOT 0.4 10/28/2023   ALKPHOS 141 (H) 10/28/2023   AST 39 10/28/2023   ALT 62 (H) 10/28/2023   PROT 7.4 10/28/2023   ALBUMIN 4.7 10/28/2023   CALCIUM  9.6 02/27/2024   ANIONGAP 11 11/16/2019   EGFR 52 (L) 02/27/2024   Lab Results  Component Value Date   CHOL 97 (L) 10/28/2023  Lab Results  Component Value Date   HDL 34 (L) 10/28/2023   Lab Results  Component Value Date   LDLCALC 51 10/28/2023   Lab Results  Component Value Date   TRIG 48 10/28/2023   Lab Results  Component Value Date   CHOLHDL 2.9 10/28/2023   Lab Results  Component Value Date   HGBA1C 5.9 (H) 02/27/2024      Assessment & Plan:   Problem List Items Addressed This Visit       Cardiovascular and Mediastinum   Essential hypertension - Primary   BP Readings from Last 1 Encounters:  06/30/24 125/79   Well-controlled with amlodipine -olmesartan  10-40 mg QD now Counseled for compliance with the medications Advised DASH diet and moderate exercise/walking, at least 150 mins/week      PAD (peripheral artery disease)   On aspirin  and statin Has claudication symptoms Has weak pulse on DPA Checked US  [facility] Vascular surgery referral provided - started Cilostazol         Respiratory   Centrilobular emphysema (HCC)   Noted on CT chest Has been trying to cut down smoking Asymptomatic currently        Endocrine   Type 2 diabetes mellitus (HCC) with other circulatory complication   Lab Results  Component Value Date   HGBA1C 5.9 (H) 02/27/2024   Well-controlled Associated with PAD, HTN and HLD On glipizide  5 mg BID, metformin  500 mg QD and Jardiance  10 mg QD Check HbA1c - if less than 6, plan to reduce Glipizide  to 5 mg QD Needs to check blood glucose regularly -advised to contact if blood glucose less than  70 Advised to follow diabetic diet On statin and ARB F/u CMP and lipid panel Diabetic eye exam: Advised to follow up with Ophthalmology for diabetic eye exam      Relevant Medications   Finerenone (KERENDIA) 10 MG TABS   Other Relevant Orders   Basic Metabolic Panel (BMET)   Hemoglobin A1c     Genitourinary   Stage 3a chronic kidney disease (HCC)   Last CMP showed GFR of 51, overall stable Checked urine microalbumin/creatinine ratio - On ARB and Jardiance , added Kerendia 10 mg QD, increase dose to 20 mg later after checking CMP after 4 weeks Avoid nephrotoxic agents Advised to maintain adequate hydration -needs to cut down soft drinks intake      Relevant Medications   Finerenone (KERENDIA) 10 MG TABS   Other Relevant Orders   Basic Metabolic Panel (BMET)   CBC     Other   Hordeolum externum left lower eyelid   Has responded well to Erythromycin  ointment in the past Was referred to [facility] by [facility] care, but could not get procedure due to cost      Relevant Medications   erythromycin  ophthalmic ointment       Meds ordered this encounter  Medications   erythromycin  ophthalmic ointment    Sig: Place a 1/2 inch ribbon of ointment into the left lower eyelid BID    Dispense:  3.5 g    Refill:  0   Finerenone (KERENDIA) 10 MG TABS    Sig: Take 1 tablet (10 mg total) by mouth daily.    Dispense:  30 tablet    Refill:  0    Follow-up: Return in about 4 months (around 10/28/2024) for Annual physical (after 10/28/24).    Suzzane MARLA Blanch, MD "

## 2024-06-30 NOTE — Telephone Encounter (Signed)
 Pharmacy Patient Advocate Encounter   Received notification from Onbase that prior authorization for Kerendia 10MG  tablets is required/requested.   Insurance verification completed.   The patient is insured through [employer].   Per test claim: PA required; PA started via CoverMyMeds. KEY [id number] . Please see clinical question(s) below that I am not finding the answer to in their chart and advise.           I apologize for so many questions that need clarification. Thanks in advance. Some of the lab values seem to not meet what is being asked. I didn't want to answer incorrectly and risk a denial.

## 2024-06-30 NOTE — Assessment & Plan Note (Addendum)
 Last CMP showed GFR of 51, overall stable Checked urine microalbumin/creatinine ratio - On ARB and Jardiance , added Kerendia 10 mg QD, increase dose to 20 mg later after checking CMP after 4 weeks Avoid nephrotoxic agents Advised to maintain adequate hydration -needs to cut down soft drinks intake

## 2024-06-30 NOTE — Assessment & Plan Note (Signed)
 BP Readings from Last 1 Encounters:  06/30/24 125/79   Well-controlled with amlodipine -olmesartan  10-40 mg QD now Counseled for compliance with the medications Advised DASH diet and moderate exercise/walking, at least 150 mins/week

## 2024-06-30 NOTE — Assessment & Plan Note (Signed)
 Has responded well to Erythromycin  ointment in the past Was referred to [facility] by [facility] care, but could not get procedure due to cost

## 2024-06-30 NOTE — Assessment & Plan Note (Signed)
 On aspirin  and statin Has claudication symptoms Has weak pulse on DPA Checked US  ABI Vascular surgery referral provided - started Cilostazol 

## 2024-06-30 NOTE — Assessment & Plan Note (Addendum)
 Lab Results  Component Value Date   HGBA1C 5.9 (H) 02/27/2024   Well-controlled Associated with PAD, HTN and HLD On glipizide  5 mg BID, metformin  500 mg QD and Jardiance  10 mg QD Check HbA1c - if less than 6, plan to reduce Glipizide  to 5 mg QD Needs to check blood glucose regularly -advised to contact if blood glucose less than 70 Advised to follow diabetic diet On statin and ARB F/u CMP and lipid panel Diabetic eye exam: Advised to follow up with Ophthalmology for diabetic eye exam

## 2024-06-30 NOTE — Patient Instructions (Addendum)
 Please start taking Kerendia as prescribed.  Please continue to take other medications as prescribed.  Please continue to follow low carb diet and perform moderate exercise/walking as tolerated.  Please get blood tests done after 4 weeks.

## 2024-07-02 ENCOUNTER — Telehealth: Payer: Self-pay

## 2024-07-02 ENCOUNTER — Other Ambulatory Visit (HOSPITAL_COMMUNITY): Payer: Self-pay

## 2024-07-02 NOTE — Telephone Encounter (Signed)
 Pharmacy Patient Advocate Encounter  Received notification from [facility] that Prior Authorization for Kerendia 10MG  tablets has been APPROVED from 07/02/2024 to 06/30/2025. Unable to obtain price due to refill too soon rejection, last fill date 07/02/2024 next available fill date01/25/2026.   PA #/Case ID/Reference #: [id number]

## 2024-07-02 NOTE — Telephone Encounter (Signed)
 Copied from CRM [id number]. Topic: Clinical - Medication Question >> Jul 02, 2024 12:17 PM Deaijah H wrote: Reason for CRM: [place] w/ [employer] PA department clinical guideline questions for medication Kerendia 10MG  tablets PA. Please call [phone]

## 2024-07-02 NOTE — Telephone Encounter (Signed)
 PA request has been Approved. New Encounter has been or will be created for follow up. For additional info see Pharmacy Prior Auth telephone encounter from 06-30-2024.

## 2024-07-07 ENCOUNTER — Telehealth: Payer: Self-pay

## 2024-07-07 NOTE — Telephone Encounter (Signed)
 Copied from CRM [id number]. Topic: Clinical - Medical Advice >> Jul 07, 2024 12:20 PM Lauren C wrote: Reason for CRM: Pt has a no sugar green tea that he wants to drink, but it says there is honey added. He wants to know if this is safe for him to drink. Please return call at [phone]

## 2024-07-08 NOTE — Telephone Encounter (Signed)
 Pt informed

## 2024-07-15 ENCOUNTER — Other Ambulatory Visit: Payer: Self-pay | Admitting: Internal Medicine

## 2024-07-15 DIAGNOSIS — E1169 Type 2 diabetes mellitus with other specified complication: Secondary | ICD-10-CM

## 2024-07-29 ENCOUNTER — Telehealth: Payer: Self-pay

## 2024-07-29 DIAGNOSIS — Z79899 Other long term (current) drug therapy: Secondary | ICD-10-CM

## 2024-07-29 NOTE — Telephone Encounter (Signed)
 Copied from CRM #[id number]. Topic: Clinical - Medication Question >> Jul 29, 2024 12:36 PM Sophia H wrote: Reason for CRM: Patient states he is being charged a copay for his medications now - having a hard time picking up due to cost.  States he was told by insurance he needed to advise provider of switching to a tier 1 medication instead.   JARDIANCE  10 MG TABS tablet Finerenone  (KERENDIA ) 10 MG TABS  Patient also states he is afraid all of his medications will have copays moving forward - would like provider to look into best / cheapest options so he can keep up with medications.   Please advise # [phone]

## 2024-07-29 NOTE — Addendum Note (Signed)
 Addended by: Hayven Croy H on: 07/29/2024 01:30 PM   Modules accepted: Orders

## 2024-07-29 NOTE — Telephone Encounter (Signed)
 I spoke with patient and he would like a referral to the pharmacist so he can work with him to find the most affordable meds with his insurance plan. Referral placed to Jacob (Pharmacist) to help with medication cost concerns.

## 2024-08-02 ENCOUNTER — Telehealth: Payer: Self-pay

## 2024-08-05 ENCOUNTER — Other Ambulatory Visit

## 2024-08-05 NOTE — Progress Notes (Unsigned)
" ° °  08/05/2024 Name: Alex Marks MRN: [id number] DOB: [birth year]  No chief complaint on file.   {Visit Type:26650}   Subjective:  Care Team: Primary Care Provider: Tobie Suzzane POUR, MD ; Next Scheduled Visit: *** {careteamprovider:27366}  Medication Access/Adherence  Current Pharmacy:  [facility] [phone] - [place], [place] - [address] AT [facility] OF [address] [place] [address] Phone: [phone] Fax: [phone]  [facility] - [place], [place] - [address] [place] [address] Phone: [phone] Fax: [phone]  [facility] - [place], [place] - [address] [place] [address] Phone: [phone] Fax: [phone]   Patient reports affordability concerns with their medications: {YES/NO:21197} Patient reports access/transportation concerns to their pharmacy: {YES/NO:21197} Patient reports adherence concerns with their medications:  {YES/NO:21197} ***   {Pharmacy S/O Choices:26420}   Objective:  Lab Results  Component Value Date   HGBA1C 5.9 (H) 02/27/2024    Lab Results  Component Value Date   CREATININE 1.40 (H) 02/27/2024   BUN 18 02/27/2024   NA 140 02/27/2024   K 4.5 02/27/2024   CL 104 02/27/2024   CO2 15 (L) 02/27/2024    Lab Results  Component Value Date   CHOL 97 (L) 10/28/2023   HDL 34 (L) 10/28/2023   LDLCALC 51 10/28/2023   TRIG 48 10/28/2023   CHOLHDL 2.9 10/28/2023    Medications Reviewed Today   Medications were not reviewed in this encounter       Assessment/Plan:   {Pharmacy A/P Choices:26421}  Follow Up Plan: ***  ***  "

## 2024-08-30 ENCOUNTER — Ambulatory Visit: Payer: 59

## 2024-09-14 ENCOUNTER — Ambulatory Visit: Admitting: Podiatry

## 2024-11-02 ENCOUNTER — Encounter: Payer: Self-pay | Admitting: Internal Medicine
